# Patient Record
Sex: Male | Born: 1945 | Race: Black or African American | Hispanic: No | Smoking: Former smoker
Health system: Southern US, Community
[De-identification: ages and names within clinical notes are randomized; demographics above are authoritative.]

## PROBLEM LIST (undated history)

## (undated) DIAGNOSIS — J449 Chronic obstructive pulmonary disease, unspecified: Secondary | ICD-10-CM

## (undated) DIAGNOSIS — K219 Gastro-esophageal reflux disease without esophagitis: Secondary | ICD-10-CM

## (undated) DIAGNOSIS — G43909 Migraine, unspecified, not intractable, without status migrainosus: Secondary | ICD-10-CM

## (undated) DIAGNOSIS — K279 Peptic ulcer, site unspecified, unspecified as acute or chronic, without hemorrhage or perforation: Secondary | ICD-10-CM

## (undated) DIAGNOSIS — I639 Cerebral infarction, unspecified: Secondary | ICD-10-CM

## (undated) DIAGNOSIS — I10 Essential (primary) hypertension: Secondary | ICD-10-CM

## (undated) DIAGNOSIS — M1990 Unspecified osteoarthritis, unspecified site: Secondary | ICD-10-CM

## (undated) DIAGNOSIS — E7800 Pure hypercholesterolemia, unspecified: Secondary | ICD-10-CM

## (undated) DIAGNOSIS — J45909 Unspecified asthma, uncomplicated: Secondary | ICD-10-CM

## (undated) DIAGNOSIS — J189 Pneumonia, unspecified organism: Secondary | ICD-10-CM

## (undated) HISTORY — PX: TONSILLECTOMY: SUR1361

---

## 1997-09-10 ENCOUNTER — Emergency Department (HOSPITAL_COMMUNITY): Admission: EM | Admit: 1997-09-10 | Discharge: 1997-09-10 | Payer: Self-pay | Admitting: Emergency Medicine

## 2003-01-22 HISTORY — PX: MASS EXCISION: SHX2000

## 2003-02-07 ENCOUNTER — Ambulatory Visit (HOSPITAL_BASED_OUTPATIENT_CLINIC_OR_DEPARTMENT_OTHER): Admission: RE | Admit: 2003-02-07 | Discharge: 2003-02-07 | Payer: Self-pay | Admitting: General Surgery

## 2003-02-07 ENCOUNTER — Encounter (INDEPENDENT_AMBULATORY_CARE_PROVIDER_SITE_OTHER): Payer: Self-pay | Admitting: Specialist

## 2003-02-17 ENCOUNTER — Emergency Department (HOSPITAL_COMMUNITY): Admission: EM | Admit: 2003-02-17 | Discharge: 2003-02-17 | Payer: Self-pay | Admitting: Emergency Medicine

## 2003-03-27 ENCOUNTER — Emergency Department (HOSPITAL_COMMUNITY): Admission: EM | Admit: 2003-03-27 | Discharge: 2003-03-28 | Payer: Self-pay | Admitting: Emergency Medicine

## 2003-07-28 ENCOUNTER — Emergency Department (HOSPITAL_COMMUNITY): Admission: EM | Admit: 2003-07-28 | Discharge: 2003-07-28 | Payer: Self-pay | Admitting: Emergency Medicine

## 2003-08-06 ENCOUNTER — Emergency Department (HOSPITAL_COMMUNITY): Admission: EM | Admit: 2003-08-06 | Discharge: 2003-08-06 | Payer: Self-pay | Admitting: Emergency Medicine

## 2004-04-20 ENCOUNTER — Emergency Department (HOSPITAL_COMMUNITY): Admission: EM | Admit: 2004-04-20 | Discharge: 2004-04-20 | Payer: Self-pay

## 2004-11-21 ENCOUNTER — Emergency Department (HOSPITAL_COMMUNITY): Admission: EM | Admit: 2004-11-21 | Discharge: 2004-11-22 | Payer: Self-pay | Admitting: Emergency Medicine

## 2004-12-21 DIAGNOSIS — I639 Cerebral infarction, unspecified: Secondary | ICD-10-CM

## 2004-12-21 HISTORY — DX: Cerebral infarction, unspecified: I63.9

## 2004-12-28 ENCOUNTER — Inpatient Hospital Stay (HOSPITAL_COMMUNITY): Admission: EM | Admit: 2004-12-28 | Discharge: 2005-01-04 | Payer: Self-pay | Admitting: Emergency Medicine

## 2004-12-28 ENCOUNTER — Ambulatory Visit: Payer: Self-pay | Admitting: Family Medicine

## 2005-01-05 ENCOUNTER — Emergency Department (HOSPITAL_COMMUNITY): Admission: EM | Admit: 2005-01-05 | Discharge: 2005-01-05 | Payer: Self-pay | Admitting: Emergency Medicine

## 2005-01-12 ENCOUNTER — Ambulatory Visit: Payer: Self-pay | Admitting: Family Medicine

## 2006-10-16 ENCOUNTER — Emergency Department (HOSPITAL_COMMUNITY): Admission: EM | Admit: 2006-10-16 | Discharge: 2006-10-16 | Payer: Self-pay | Admitting: Emergency Medicine

## 2007-02-21 DIAGNOSIS — J189 Pneumonia, unspecified organism: Secondary | ICD-10-CM

## 2007-02-21 HISTORY — DX: Pneumonia, unspecified organism: J18.9

## 2007-02-21 HISTORY — PX: VIDEO ASSISTED THORACOSCOPY (VATS)/EMPYEMA: SHX6172

## 2007-03-01 ENCOUNTER — Emergency Department (HOSPITAL_COMMUNITY): Admission: EM | Admit: 2007-03-01 | Discharge: 2007-03-01 | Payer: Self-pay | Admitting: Emergency Medicine

## 2007-03-02 ENCOUNTER — Ambulatory Visit: Payer: Self-pay | Admitting: Thoracic Surgery

## 2007-03-02 ENCOUNTER — Inpatient Hospital Stay (HOSPITAL_COMMUNITY): Admission: EM | Admit: 2007-03-02 | Discharge: 2007-03-21 | Payer: Self-pay | Admitting: Emergency Medicine

## 2007-03-02 ENCOUNTER — Ambulatory Visit: Payer: Self-pay | Admitting: Internal Medicine

## 2007-03-10 ENCOUNTER — Encounter: Payer: Self-pay | Admitting: Thoracic Surgery

## 2007-03-10 ENCOUNTER — Encounter (INDEPENDENT_AMBULATORY_CARE_PROVIDER_SITE_OTHER): Payer: Self-pay | Admitting: Diagnostic Radiology

## 2007-03-15 DIAGNOSIS — J1520 Pneumonia due to staphylococcus, unspecified: Secondary | ICD-10-CM | POA: Insufficient documentation

## 2007-03-15 DIAGNOSIS — J869 Pyothorax without fistula: Secondary | ICD-10-CM | POA: Insufficient documentation

## 2007-03-19 DIAGNOSIS — T8189XA Other complications of procedures, not elsewhere classified, initial encounter: Secondary | ICD-10-CM | POA: Insufficient documentation

## 2007-03-29 ENCOUNTER — Encounter: Payer: Self-pay | Admitting: *Deleted

## 2007-04-05 ENCOUNTER — Encounter (INDEPENDENT_AMBULATORY_CARE_PROVIDER_SITE_OTHER): Payer: Self-pay | Admitting: Internal Medicine

## 2007-04-05 ENCOUNTER — Ambulatory Visit: Payer: Self-pay | Admitting: Family Medicine

## 2007-04-05 ENCOUNTER — Encounter: Admission: RE | Admit: 2007-04-05 | Discharge: 2007-04-05 | Payer: Self-pay | Admitting: Sports Medicine

## 2007-04-05 DIAGNOSIS — I10 Essential (primary) hypertension: Secondary | ICD-10-CM

## 2007-04-05 LAB — CONVERTED CEMR LAB
HCT: 40 % (ref 39.0–52.0)
Hemoglobin: 11.7 g/dL — ABNORMAL LOW (ref 13.0–17.0)
MCHC: 29.3 g/dL — ABNORMAL LOW (ref 30.0–36.0)
Platelets: 382 10*3/uL (ref 150–400)
RBC: 4.35 M/uL (ref 4.22–5.81)

## 2007-04-12 ENCOUNTER — Ambulatory Visit: Payer: Self-pay | Admitting: Family Medicine

## 2007-04-16 ENCOUNTER — Ambulatory Visit: Payer: Self-pay | Admitting: Family Medicine

## 2007-04-16 ENCOUNTER — Observation Stay (HOSPITAL_COMMUNITY): Admission: EM | Admit: 2007-04-16 | Discharge: 2007-04-18 | Payer: Self-pay | Admitting: Emergency Medicine

## 2007-05-23 ENCOUNTER — Ambulatory Visit: Payer: Self-pay | Admitting: Family Medicine

## 2007-05-23 ENCOUNTER — Encounter (INDEPENDENT_AMBULATORY_CARE_PROVIDER_SITE_OTHER): Payer: Self-pay | Admitting: Family Medicine

## 2007-05-23 DIAGNOSIS — R079 Chest pain, unspecified: Secondary | ICD-10-CM

## 2007-05-23 LAB — CONVERTED CEMR LAB
AST: 33 units/L (ref 0–37)
CO2: 25 meq/L (ref 19–32)
Calcium: 9.7 mg/dL (ref 8.4–10.5)
Creatinine, Ser: 0.8 mg/dL (ref 0.40–1.50)
LDL Cholesterol: 112 mg/dL — ABNORMAL HIGH (ref 0–99)
Potassium: 3.3 meq/L — ABNORMAL LOW (ref 3.5–5.3)
Sodium: 142 meq/L (ref 135–145)
Total CHOL/HDL Ratio: 5.1
Total Protein: 8.7 g/dL — ABNORMAL HIGH (ref 6.0–8.3)
Triglycerides: 143 mg/dL (ref <150)

## 2007-06-23 ENCOUNTER — Encounter (INDEPENDENT_AMBULATORY_CARE_PROVIDER_SITE_OTHER): Payer: Self-pay | Admitting: *Deleted

## 2007-07-13 ENCOUNTER — Ambulatory Visit: Payer: Self-pay | Admitting: Family Medicine

## 2007-07-13 ENCOUNTER — Encounter (INDEPENDENT_AMBULATORY_CARE_PROVIDER_SITE_OTHER): Payer: Self-pay | Admitting: Family Medicine

## 2007-07-13 LAB — CONVERTED CEMR LAB
Chloride: 107 meq/L (ref 96–112)
Potassium: 4.3 meq/L (ref 3.5–5.3)
Sodium: 141 meq/L (ref 135–145)

## 2007-07-15 ENCOUNTER — Encounter (INDEPENDENT_AMBULATORY_CARE_PROVIDER_SITE_OTHER): Payer: Self-pay | Admitting: *Deleted

## 2010-10-05 NOTE — Op Note (Signed)
NAME:  [name], [name] NO.:  [id number]   MEDICAL RECORD NO.:  [id number]          PATIENT TYPE:  INP   LOCATION:  3313                         FACILITY:  [facility]   PHYSICIAN:  Ines Bloomer, M.D. DATE OF BIRTH:  1946/04/25   DATE OF PROCEDURE:  03/10/2007  DATE OF DISCHARGE:                               OPERATIVE REPORT   PREOPERATIVE DIAGNOSIS:  Empyema right chest.   POSTOPERATIVE DIAGNOSIS:  Empyema right chest.   OPERATION PERFORMED:  Right video assisted thoracoscopic surgery,  drainage of empyema with decortication.   SURGEON:  Dr. Patricia Nettle. Burney   FIRST ASSISTANT:  Ms. Emerson Monte, RNFA .   ANESTHESIA:  General anesthesia.   After percutaneous insertion of all monitoring lines, the patient  underwent general anesthesia was prepped and draped in the usual sterile  manner.  Dual-lumen tube was inserted.  The patient was turned to right  lateral thoracotomy position.  Two trocar sites were made in the  anterior and posterior axillary line at the eighth intercostal space, at  the midaxillary line at the seventh intercostal space.  Three trocars  were inserted and the patient had a marked empyema of the lower lobe and  this required decortication.  We first drained the empyema and sent for  cultures and using Kaiser ring forceps and the 30 degree scope.  We  debrided the chest wall debriding superiorly, laterally and medially and  then freed the right lower lobe up off the diaphragm and right middle  lobe off the diaphragm and the middle mediastinum.  To do the  decortication we did a 5 cm incision in the sixth intercostal space and  partially divided latissimus with the serratus and then inserted a small  lamina spreader. With this we were able to remove the thickened exudate  by dissecting it off the right middle lobe, posterior segment of right  upper lobe and the right lower lobe.  I debrided the diaphragmatic  surface and the right lower lobe off  the pleura off the diaphragm and  cleared all exudate out of the costophrenic angle.  After this been  done, several small tears of the lung were repair of 3-0 Vicryl and the  chest tubes were placed through the trocar site, two straight and a  right-angle, the right-angle being the middle of the trocar sites.  The  chest was closed with two pericostal drilling through seventh rib and  going around the sixth rib, #1 Vicryl muscle layer, 2-0 Vicryl in  subcutaneous tissue and Dermabond for the skin.  A Marcaine block was  done in the usual fashion.  A single On-Q was inserted in the usual  fashion.  The patient was returned to recovery room in stable condition.      Ines Bloomer, M.D.  Electronically Signed     DPB/MEDQ  D:  03/10/2007  T:  03/12/2007  Job:  [id number]

## 2010-10-05 NOTE — Discharge Summary (Signed)
NAME:  [name], SOULES NO.:  [id number]   MEDICAL RECORD NO.:  [id number]          PATIENT TYPE:  INP   LOCATION:  3039                         FACILITY:  [facility]   PHYSICIAN:  Nestor Ramp, MD        DATE OF BIRTH:  Oct 10, 1945   DATE OF ADMISSION:  04/16/2007  DATE OF DISCHARGE:  04/18/2007                               DISCHARGE SUMMARY   PRIMARY CARE Florinda Taflinger:  Alanda Amass, M.D., of [facility].   REASON FOR ADMISSION:  Right-sided chest pain.   DISCHARGE DIAGNOSES:  1. Pleurisy secondary to video-assisted thoracic surgery procedure on      March 10, 2007.  2. Recent methicillin-resistant Staphylococcus aureus pneumonia.  3. 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  4. History of heroin addiction.  5. Hypertension.  6. Chronic anemia.  7. History of depression.   DISCHARGE MEDICATIONS:  1. Ultram 100 mg 1 tab p.o. q.6 h., #50, no refills.  2. Naproxen 500 mg 1 tab p.o. b.i.d., #25, no refills.  3. Hydrochlorothiazide 25 mg p.o. daily.  4. Metoprolol 50 mg p.o. daily.  5. Trazodone 100 mg p.o. nightly.  6. Lisinopril 20 mg p.o. daily.  7. Flexeril 10 mg p.o. b.i.d.  8. Norvasc 10 mg p.o. daily (patient was given a prescription for a 3-      month supply and encouraged to go to [facility] to get it filled as he      has had difficulty paying for medications in the past).   HOSPITAL COURSE:  In summary, patient is a 65 year old male who  presented to the hospital 6 weeks status post VATS procedure for empyema  for MRSA pneumonia.  His main complaint was continued pain in his right  lower chest.  1. Pleurisy:  Upon evaluation it was determined that patient's chest      pain was secondary to ongoing healing following his VATS procedure.      He had extensive decortication done secondary to his empyema in      October 2008.  Patient was counseled that he will continue to have      some  amount of chest pain for several weeks while his lungs heal.      While in the hospital, his pain medications were titrated until      patient received a moderate level of comfort and felt that he would      be able to function at home without difficulty.  He was ultimately      discharged on Ultram, Naprosyn, Flexeril.  2. Hypertension:  Throughout hospital stay, Mr. Torry blood      pressures proved difficult to control.  He reported that he took      all of his home medications as scheduled; however, his blood      pressures initially ranged from in the 170s to 220s systolic.  He      was continued on his home regimen of hydrochlorothiazide and      metoprolol and lisinopril and Norvasc were  added.  The patient's      blood pressure prior to discharge was 140/84.  He will need      continued titration and management of his hypertension as an      outpatient.   CONDITION AT THE TIME OF DISCHARGE:  Stable.   DISCHARGE LABS:  Sodium 136, potassium 3.1, BUN 13, creatinine 0.82,  white blood cell count 7.7, hemoglobin 12.1, platelets 349, positive  drug screen for opiates and amphetamines.   DISPOSITION:  Patient was discharged to home.   DISCHARGE FOLLOWUP:  The patient is to follow up at [facility] on April 26, 2007 at 9 a.m.   FOLLOWUP ISSUES:  1. Hypertension and titration of antihypertensive medications.  2. Hypokalemia.  Patient was given 40 mEq of potassium chloride x2      prior to discharge.  Should have repeat labs drawn as an      outpatient.  3. Continued management of patient's pain control medications.      Lauro Franklin, MD  Electronically Signed      Nestor Ramp, MD  Electronically Signed    TCB/MEDQ  D:  04/19/2007  T:  04/20/2007  Job:  [id number]

## 2010-10-05 NOTE — Discharge Summary (Signed)
NAME:  [name], SCHNITZER NO.:  [id number]   MEDICAL RECORD NO.:  [id number]          PATIENT TYPE:  INP   LOCATION:  2007                         FACILITY:  [facility]   PHYSICIAN:  Mariea Stable, MD   DATE OF BIRTH:  06-12-1945   DATE OF ADMISSION:  03/02/2007  DATE OF DISCHARGE:  03/21/2007                               DISCHARGE SUMMARY   ATTENDING PHYSICIAN:  Alvester Morin, M.D.   DIAGNOSES AT DISCHARGE:  1. Methicillin resistant Staphylococcus aureus (MRSA) pneumonia.  2. Status post video-assisted thoracoscopy.  3. Hypertension.  4. Reactive thrombocytosis.  5. Anemia of chronic disease.   PAST MEDICAL HISTORY:  1. Hypertension.  2. History of cerebrovascular accident (CVA), which was hemorrhagic.  3. History of substance abuse with opioid dependence.  4. Depression.   MEDICATIONS AT TIME OF DISCHARGE:  1. Norvasc 10 mg p.o. daily.  2. Augmentin 875 mg p.o. b.i.d.  3. Doxycycline 100 mg p.o. b.i.d.  4. Percocet 5/325 mg 1 to 2 tablets p.o. q.4h. p.r.n. pain.  5. Colace 100 mg p.o. b.i.d. p.r.n. constipation.   DISPOSITION AND FOLLOWUP:  Patient had an appointment scheduled at the  outpatient clinic at the [facility] on March 29, 2007, at 8:45 a.m.; the phone number is [phone].  He is also to  follow up with Dr. Edwyna Shell, whose office will contact the patient to  schedule an appointment, and the phone number is [phone].  During the  visit, a CBC should be checked to make sure that there is no elevated  white count.  Also, lung exam to determine good air movement in  bilateral lungs, especially in the right lower lobe where the majority  of the pneumonia and empyema were located.   PROCEDURES PERFORMED:  The patient has had multiple chest x-rays through  the hospital course.   The first chest x-ray was March 02, 2007, impression:  1. Increased air space opacity of the right lung space involving the      lower lobe and  middle lobes with small effusion compatible with      pneumonia.  2. Worsening atelectasis versus additional site of infection at the      left lung base.   CT scan of the chest with contrast March 09, 2007, impression:  1. Large loculated right pleural effusion with compression atelectasis      at the right lower lobe.  Focal small areas of consolidation in the      right middle lobe, one of which has a nodular type appearance.  I      recommend followup to exclude the possibility of a persistent      nodule, although I think it is most likely pneumonia.  2. Small focal area of pneumonia at the left lung base posteriorly.  3. No evidence of abscess.  The pleura does not enhance intensely      around the effusion and so this is not felt to represent an empyema      at this point.  As listed, the patient has had virtually daily x-  rays done at this point.   The last x-ray done on March 20, 2007, impression:  1. Improving aeration, right base.  The air space remains worrisome      for pneumonia.  2. Stable right pleural effusion.  3. Subsegmental atelectasis of left base.   Furthermore, with procedures, March 10, 2007, Dr. Jovita Gamma  performed a video-assisted thoracoscopic surgery for drainage of empyema  with decortication with placement of 3 chest tubes that were  discontinued on subsequent days without any evidence of pneumothorax  following removal of each of the 3 chest tubes.   CONSULTATIONS:  Again, Dr. Ines Bloomer, M.D. from [facility] was  consulted for the loculated effusion/empyema.   BRIEF HISTORY AND PHYSICAL:  [name] is a 65 year old African  American male with a history of hypertension, hemorrhagic CVA, history  of heroin abuse and depression that presents with a 5-day history of  pleuritic right-sided chest pain associated with cough, brown sputum  production.  Patient was released from prison last Thursday after a 4-  1/55-month stay.  He  endorses subjective fevers and chills x2 days, cough  with sputum production for about 5 days.  He reports one episode of  hemoptysis on Monday, which was described as a slight blood/red streaks  of sputum.  No substernal chest pain or radiation sustained to the neck,  jaw or arms.  Pain is worse with deep breaths, coughing and any  movement.  Nothing has improved the pain.  The patient denies any recent  travel or sick contacts.  He denies night sweats, diarrhea, nausea,  vomiting, constipation or any urinary symptoms.  Patient feels generally  weak, but no focal or neurological deficits, headache or loss of  consciousness.   PHYSICAL EXAMINATION:  VITALS:  Temperature 99.4, blood pressure 128/74,  pulse of 94, respiratory rate 14, oxygen saturation 95% on room air.  GENERAL:  The patient was awake, appears to be in pain with breathing.  Splints chest with coughing and appears in mild distress.  HEENT:  Eyes:  Pupils equally round and reactive to light and  accommodation.  Extraocular movements are intact.  Sclera was anicteric.  No conjunctival injection.  ENT:  Small sore at the right corner of the  mouth, bare dentition.  Oropharynx was clear with moist mucous  membranes.  NECK:  Supple, no lymphadenopathy.  Trachea was midline.  RESPIRATORY:  Poor inspirations with short gasps.  Faint bibasilar rales  with increased dullness to percussion in the right middle and right  lower lobe.  No wheezes or rhonchi.  CHEST:  Increased pain with palpation of right fourth and fifth ribs  below the nipple.  HEART:  Regular rate and rhythm, 2/6 systolic murmur at apex without  radiation to the axilla.  GI:  Abdomen is soft, nontender, nondistended, no masses palpated.  EXTREMITIES:  No cyanosis, clubbing or edema.  SKIN:  Without rashes.  LYMPHATICS:  No lymphadenopathy noted.  NEUROLOGICAL:  Patient is awake, alert and oriented, cranial nerves II-  XII are intact.  Strength intact.  Sensation  intact.  PSYCHIATRIC:  Somewhat reserved, but in pain.  Therefore, affect was  deemed appropriate.   LABS ON ADMISSION:  Sodium 142, potassium 3.4, chloride 107, bicarb 26,  BUN 15, creatinine 0.95, glucose 111, anion gap of 9, bilirubin 0.8, alk  phos 76, AST 26, ALT 28, protein 7.1, albumin 2.3, calcium 8.6, WBCs  18.7, ANC 15.5, hemoglobin 13, MCV 85, RDW 14.5, platelets 317,000.  UDS  was positive for opiates.  Chest x-ray as mentioned above in procedures.   HOSPITAL COURSE:  1. Bilateral pneumonia.  Patient was admitted for the pneumonia and      started on Rocephin and Zithromax originally for community acquired      pneumonia; had slight improvement, followed by increase in white      count and temperature spikes.  CT of the chest was done and showed      loculated effusions.  Dr. Edwyna Shell was consulted for drainage of      effusion.  VATS with decortication of empyema was performed on      March 10, 2007, of which vancomycin and Zosyn had already been      started by this time.  Patient had 3 chest tubes in place.      Patient's white count began to decrease, along with temperature.      Given respiratory culture only grew MRSA and the empyema culture      grew coag-negative staph, Zosyn was discontinued and vancomycin was      discontinued.  Patient had an insignificant increase in his white      count and temperatures the following day.  By the following day      patient spiked fever to greater than 100.5 and WBCs increased once      again.  The Zosyn, therefore, was restarted with subsequent      decrease in WBCs and temperature.  Once patient normalized, he was      switched to p.o. doxycycline and Augmentin for similar p.o.      coverage.  Patient was kept overnight and through the following day      without any increase in WBCs or temperatures and was discharged      thereafter.  2. Status post VATS.  Patient's pain was well controlled throughout.      Chest tube removal  with following chest x-rays were stable without      any pneumothoraces.  After the third chest tube removal, chest x-      rays again remained stable without pneumothoraces or increase in      the effusion.  3. Hypertension.  Patient was commenced, maintained on his home dose      of Norvasc through the hospitalization.  Blood pressure improved      once pain was better controlled.  4. Reactive thrombocytosis.  Patient came in with a normal platelet      count of about 270,000 or so.  Patient had a rapid increase in      platelets, peaking at approximately 1.1 million.  After vancomycin      was restarted the second time, patient's platelets began to      decrease subsequently.  5. Anemia of chronic disease, per anemia panel.  Hemoglobin remained      stable after surgery.   LABS ON THE DAY OF DISCHARGE:  WBCs 10.6, hemoglobin 9.9, platelets  816,000, sodium 136, potassium 3.7, chloride 108, bicarb 24, glucose 94,  BUN 7, creatinine 1, calcium 8.7.      Mariea Stable, MD  Electronically Signed     MA/MEDQ  D:  03/23/2007  T:  03/24/2007  Job:  [id number]   cc:   Maurice March, M.D.  Ines Bloomer, M.D.  [facility]

## 2010-10-05 NOTE — H&P (Signed)
NAMEMarland Kitchen  SHANKAR, SILBER NO.:  [id number]   MEDICAL RECORD NO.:  [id number]          PATIENT TYPE:  INP   LOCATION:  3022                         FACILITY:  [facility]   PHYSICIAN:  Altamese Cabal, M.D.  DATE OF BIRTH:  [birth year]   DATE OF ADMISSION:  04/16/2007  DATE OF DISCHARGE:                              HISTORY & PHYSICAL   REASON FOR ADMISSION:  Right-sided chest pain.   PRIMARY CARE PHYSICIAN:  [facility], Dr. Melynda Ripple.   HISTORY OF PRESENT ILLNESS:  This patient is a 65 year old male who is 6  weeks status post VATS for empyema from MRSA pneumonia.  The patient is  here today because he has had continued pain in his right lower chest  area since this procedure.  He also feels congested nasally and short of  breath at times.  He has been on ibuprofen for pain and states it is not  helping (of note, he has a history of opioid dependence).  The patient's  chest pain seems incisional and pleuritic and similar to the pain he had  2 weeks ago in clinic.  He has an occasional nonproductive cough.  No  fevers higher than 100.  The main reason he came to the ED today,  because he was tired of hurting.   PAST MEDICAL HISTORY:  1. Recent MRSA pneumonia.  2. A history of hemorrhagic CVA in 2006 in the setting of cocaine      toxicity.  3. A history of heroin addiction.  4. Hypertension.  5. Chronic anemia.  6. A history of depression.   PAST SURGICAL HISTORY:  1. A VATS in October 2008 for empyema secondary to MRSA pneumonia.  2. Chest tubes x3 in October 2008.   MEDICATIONS:  1. HCTZ 12.5 mg p.o. every day.  2. Ibuprofen 600 mg p.o. q.6h. p.r.n.  3. Metoprolol 50 mg p.o. b.i.d.  4. Trazodone 100 mg p.o. q.h.s.   ALLERGIES:  NO KNOWN DRUG ALLERGIES.   FAMILY HISTORY:  Patient's brother died of lung cancer.  Patient's  father died of unknown cause.  Patient's mother died of cirrhosis  secondary to alcohol use.   SOCIAL HISTORY:  He was recently  incarcerated for unknown reasons.  He  lives with a friend and daughter.  He is single.  He quit smoking 1 year  ago, rare alcohol, a history of heroin use, last use was 18 months ago.   PHYSICAL EXAM:  VITAL SIGNS:  Temperature 97.7, blood pressure 190/110,  pulse 85, respirations 16, O2 of 98% on room air.  GENERAL:  The patient is alert and oriented x3, no acute distress.  He  is speaking in complete sentences.  HEENT:  Head is normocephalic, atraumatic.  Pupils equal, round,  reactive to light and accommodation.  Extraocular movements intact.  Moist mucous membranes.  SKIN EXAM:  He has a puncture wound from the chest tube in his right  axillary line.  There is no exudate, erythema, or warmth.  He has 2  other scars in his right lower chest.  NECK:  Supple, no bruits, no JVD, slight cervical lymphadenopathy.  CARDIOVASCULAR:  He has a 1/6 systolic ejection murmur, otherwise  regular rate and rhythm.  LUNGS:  He has decreased breath sounds in his right base, otherwise  clear, normal work of breathing.  He is tender to palpation in his right  lower chest.  ABDOMEN:  Positive bowel sounds, soft, nontender, nondistended.  EXTREMITIES:  No clubbing, cyanosis, or edema.  NEURO EXAM:  Cranial nerves II through XII are grossly intact.  No focal  deficits.   LABS AND STUDIES:  CMP is pending.  White blood count 5.3, hemoglobin  12.0, hematocrit 36.9, platelets 293.  UA within normal limits.  Chest x-  ray shows stable blunting of the costophrenic angle consistent with  pleural thickening.  He also has a small stable pleural effusion and  changes consistent with COPD.  No pneumonia.   ASSESSMENT AND PLAN:  This is a 65 year old male with 6 weeks status  post video-assisted thoracoscopic surgery here for continued chest pain  and shortness of breath.  Problem:  1. Chest pain.  The pain seems incisional and pleuritic.  There is no      new changes on the chest x-ray.  He is afebrile with  normal white      count and normal saturations on room air.  He likely has pleural      irritation.  We will treat pain with Toradol.  We will attempt to      limit narcotics given history of heroin addiction.      Electrocardiogram is within normal limits.  We will also check      cardiac enzymes.  2. Shortness of breath, questionable splinting from pain.  No evidence      of pneumonia or pneumothorax on chest x-ray.  He may have      underlying chronic obstructive pulmonary disease.  We need      pulmonary function tests as an outpatient.  He also needs to follow      up with Dr. Edwyna Shell.  We will give albuterol and Atrovent in the      hospital and Flonase for nasal congestion.  3. Hypertension.  This is uncontrolled.  He states he has been      compliant, but he has had trouble affording medications, including      Norvasc.  We will continue metoprolol as prescribed in clinic by      Dr. Mayford Knife, check urine drug screen, and continue      hydrochlorothiazide at increased dose of 25 every day.  4. Chronic anemia.  His hemoglobin is low-normal, we will follow this.  5. A history of substance abuse.  The patient currently states he is      drug-free and attends Narcotics Anonymous meetings occasionally.      We will check a urine drug screen.  We will get a social work      consult for support if needed.  6. Fluids, electrolytes, nutrition, and gastrointestinal.  He will be      on a heart healthy diet, saline lock his IV.  7. Prophylaxis.  He will have sequential compression devices and      Protonix.      Altamese Cabal, M.D.  Electronically Signed     KS/MEDQ  D:  04/16/2007  T:  04/17/2007  Job:  [id number]

## 2010-10-08 NOTE — Discharge Summary (Signed)
NAMEMarland Kitchen  [name], [name] NO.:  [id number]   MEDICAL RECORD NO.:  [id number]          PATIENT TYPE:  INP   LOCATION:  3021                         FACILITY:  [facility]   PHYSICIAN:  Henri Medal, MDDATE OF BIRTH:  [birth year]   DATE OF ADMISSION:  12/28/2004  DATE OF DISCHARGE:  01/04/2005                                 DISCHARGE SUMMARY   DISCHARGE DIAGNOSES:  1.  Cerebrovascular accident, hemorrhagic.  2.  Hypertensive crisis.  3.  Substance abuse.   DISCHARGE MEDICATIONS:  1.  HCTZ 25 mg 1 tab p.o. daily.  2.  Lipitor 40 mg 1 tab p.o. daily.  3.  Tylenol 500 mg 1 tab p.o. q.4h. for headache.  4.  Methadone 55 mg 1 tab p.o. daily.   FOLLOW-UP APPOINTMENTS:  1.  Dr. Channing Mutters, neurosurgery.  Call for appointment in two weeks.  Phone [phone].  2.  Dr. Ludwig Clarks at family practice center, [facility].  Patient will call      for an appointment in 1-2 weeks.  Phone [phone].   PROCEDURE:  1.  Head CT, December 28, 2004:  Left caudate hemorrhage extending into the      lateral ventricle with a midline shift.  Indeterminate age, right      internal capsule thalamic lacunes.  2.  CT on December 29, 2004:  No acute changes.  No new abnormalities.  3.  Head CT on December 30, 2004:  Stable hemorrhage, left shift stable, 5 mm.      No hydrocephalus.  No new  findings.   CONSULTATIONS:  Dr. Channing Mutters, neurosurgery.   HOSPITAL COURSE:  A 65 year old African-Americanerican male with a history of  hypertension and cocaine abuse presented to the ED at [facility] with a  history of headache since 72 hours ago.  The patient described the headache  as severe, behind both eyes.  Burning and pressure in character.  No focal  neurologic symptoms that the patient recalls.  Headache is not improved with  Aleve for the past two days.  Decided to come to the ED.  The patient denied  fever, syncope, dizziness, visual changes, chills.  No cough or chest pain,  abdominal pain, no change in his  stools, no bloody stools.  Patient denies  nausea/vomiting.  Patient has a past medical history of hypertension for 3-4  years, heroin addiction, clean for eight months, followed by [facility].   PHYSICAL EXAMINATION:  VITAL SIGNS:  On physical examination, the patient  was afebrile.  Blood pressure 234/126, pulse 55, respirations 16-20, O2 sats  99% on room air.  GENERAL:  A thin male lying flat on the stretcher in mild distress.  Patient  was oriented x3 and alert.  Appropriate.  HEENT:  On fundal exam, no papilledema.  Eye exam was unremarkable.  NECK:  No JVD.  No bruits.  No thyromegaly.  CARDIOVASCULAR:  Unremarkable.  NEUROLOGIC:  Cranial nerves II-XII were intact.  No weakness.  No sensory  deficits.  Strength 5/5 x4.  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   LABORATORY DATA ON ADMISSION:  WBC 13.4, hemoglobin 15.4, hematocrit 49,  platelets 243.  Sodium 138, potassium 3.6, chloride 105, CO2 23, BUN 17,  creatinine 0.7, glucose 121.  AST 34, ALT 47, alkaline phosphatase 92, total  bilirubin 1.2.   CT of the head showed caudate hematoma with intraventricular extension.  A 5  mm shift, indeterminate age, lacunar right intracranial and thalamic  infarct.   EKG showed prolonged Q-T.  Question for ectopic atrial beats, rate 65.   PROBLEM LIST:  1.  CVA, hemorrhagic secondary to hypertensive emergency:  Dr. Channing Mutters,      neurosurgery, was consulted and evaluated the patient.  The patient's      neuro exam was unremarkable.  The patient was admitted to step-down bed      with frequent neuro checks.  The patient was placed on Dilaudid IV, PCA,      for headache.  Aggressive treatment for hypertension was initiated.  FLP      showed cholesterol of 174, triglycerides 95, HDL 44, LDL 111.  The      patient was placed on Lipitor 40 mg 1 tab p.o. daily for a goal of LDL      less than 100 (two-degree risk factors for CVA).  Throughout his       hospitalization, the patient's neuro exam was unremarkable.  CT scan      showed no signs of rebleeding/extension of intracranial hemorrhage.  Dr.      Channing Mutters, neurosurgeon, signed off from a neurological standpoint and will      follow the patient in two weeks after DC home (the patient will call for      an appointment).  This CVA hemorrhage was secondary to hypertensive      emergency, secondary to noncompliance with blood pressure medications      and cocaine abuse.  The patient's UDS was positive for cocaine.  The      patient is aware of the risks of uncontrolled hypertension and continued      abuse of cocaine.  2.  Hypertensive emergency:  Blood pressure on admission 234/126.  The      patient did present end-organ damage (CVA, hemorrhagic).  The patient      was initially placed on nitroprusside drip and labetalol IV p.r.n.      Later, IV medications were discontinued, and patient was placed on      labetalol, clonidine, lisinopril, and HCTZ.  After the first two days of      stable blood pressure, blood pressure decreased remarkably.  The patient      was left on HCTZ 25 mg p.o. daily with stable blood pressure.  By August      14, the blood pressure was 136/90.  The patient was DC'd home on HCTZ 25      mg 1 tab p.o. daily.  Certainly, for blood pressure control, the patient      needs to be compliant with medications and discontinue cocaine abuse.  3.  Status post substance abuse:  The patient has a history of heroin abuse.      He states that he has been free of heroin for eight month, and he is      currently on methadone 55 mg 1 tab p.o. daily per [facility].  Patient was      placed on this current regimen.  On admission, Mr. Coye was positive      for  cocaine on the UDS.  After discussing with the patient the risks of      continued abusing with this substance, such as death, recurrent CVA, the     patient stated that he will continue to receive [facility].  Patient was DC'd      home,  and the patient compromised to return to [facility] for methadone refills      for continued therapy/rehab.  4.  Social:  The patient has known medical issues.  The patient is unable to      afford medications.  Care management was involved for __________.  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  Patient has not renewed his      subscription to [employer].  The patient will follow up with the      family practice service for hypertension, hyperlipidemia.      Henri Medal, MD     FIM/MEDQ  D:  01/03/2005  T:  01/03/2005  Job:  [id number]

## 2010-10-08 NOTE — Discharge Summary (Signed)
NAMEMarland Kitchen  Landry, [name] NO.:  [id number]   MEDICAL RECORD NO.:  [id number]          PATIENT TYPE:  INP   LOCATION:  3021                         FACILITY:  [facility]   PHYSICIAN:  Alan Bumpers. Landry, M.D.DATE OF BIRTH:  [birth year]   DATE OF ADMISSION:  12/28/2004  DATE OF DISCHARGE:  01/04/2005                                 DISCHARGE SUMMARY   ADDENDUM:  Today, January 04, 2005, the patient is clinically stable, vital  signs reviewed, physical examination no changes from yesterday. (Neurologic  physical exam unremarkable).  The patient was discharged home after physical  therapy exercise.  The patient was discharged home on January 04, 2005  because this patient's brother is coming to stay with him, therefore, the  patient will have assistance if he needs it.  __________ care management to  arrange home PT x1.   DISCHARGE MEDICATIONS:  1.  Hydrochlorothiazide 25 mg one tablet p.o. daily.  2.  Lipitor 40 mg one tablet p.o. daily.  3.  Tylenol 500 mg one tablet p.o. q.4h. for headache, maximum dose per day      4 g.  4.  Methadone 55 mg daily.  __________  5.  Prozac 20 mg one tablet p.o. daily.   DISCHARGE DIAGNOSES:  1.  Cerebrovascular accident, hemorrhagic.  2.  Hypertensive crisis.  3.  Substance abuse.  4.  Depression.      Alan Medal, MD    ______________________________  [name], M.D.    [name]  D:  01/04/2005  T:  01/04/2005  Job:  [id number]

## 2010-10-08 NOTE — Op Note (Signed)
NAME:  [name], [name]                     ACCOUNT NO.:  [id number]   MEDICAL RECORD NO.:  [id number]                   PATIENT TYPE:  AMB   LOCATION:  DSC                                  FACILITY:  [facility]   PHYSICIAN:  Angelia Mould. Derrell Lolling, M.D.             DATE OF BIRTH:  1945/06/13   DATE OF PROCEDURE:  02/07/2003  DATE OF DISCHARGE:  02/07/2003                                 OPERATIVE REPORT   PREOPERATIVE DIAGNOSES:  1. A 3.5 soft tissue mass  of the occipital scalp.  2. A 2.5 cm soft tissue mass of the left face, preauricular area.   POSTOPERATIVE DIAGNOSES:  1. A 3.5 soft tissue mass  of the occipital scalp.  2. A 2.5 cm soft tissue mass of the left face, preauricular area, suspect     sebaceous cysts.   OPERATION:  Excision of soft tissue mass of the occipital scalp and soft  tissue mass of the left face.   INDICATIONS FOR PROCEDURE:  This is a 65 year old black man who was sent to  me through the courtesy of Dr. Yisroel Ramming at [facility]. The  patient states he had a lesion on the occipital scalp for 8 months which has  never been inflamed but is slowly enlarging. He also  states he has a soft  tissue mass in front of the left ear for about 6 months, also slowly  enlarging, but never inflamed or painful.   On examination  he has a 3.5-cm raised soft tissue mass of  the occipital  scalp which is slightly mobile. It does not appear fixed to the skull. The  skin is thin overlying the mass. In the left preauricular area there is a  1.5 x 2.5 cm soft tissue mass which is slightly mobile. It appears to be in  the superficial tissue  and does not appear to be fixed to the parotid  gland. He is brought to the operating room electively.   DESCRIPTION OF PROCEDURE:  The patient was brought to the operating room. He  was monitored and sedated by the anesthesia department. The occipital scalp  and the left face were prepped and draped in a sterile fashion. The  patient  was positioned appropriately.   A longitudinal elliptical incision was made over the occipital scalp mass  and what appeared  to be an epidermoid cyst was excised from the soft  tissue. Hemostasis was excellent and achieved with electrocautery. The wound  was irrigated and the skin was closed with skin staples.   Attention was directed to the left face, where a longitudinal incision was  made overlying the soft tissue mass. This was dissected away from the  subcutaneous tissue and also appeared  to be an epidermoid cyst. This was  completely excised as well. Hemostasis was excellent and achieved with  electrocautery. The wound was irrigated with saline. The skin was closed  with a running suture of  5-0 nylon. Clean bandages were placed.   The patient was taken to the recovery room in stable condition. Estimated  blood loss was 10 mL. There were no complications. Sponge and instrument  counts were correct.                                               Angelia Mould. Derrell Lolling, M.D.   HMI/MEDQ  D:  03/16/2003  T:  03/16/2003  Job:  [id number]

## 2010-10-08 NOTE — H&P (Signed)
NAME:  [name], [name] NO.:  [id number]   MEDICAL RECORD NO.:  [id number]          PATIENT TYPE:  INP   LOCATION:  1843                         FACILITY:  [facility]   PHYSICIAN:  Leighton Roach McDiarmid, M.D.DATE OF BIRTH:  02/18/1946   DATE OF ADMISSION:  12/28/2004  DATE OF DISCHARGE:                                HISTORY & PHYSICAL   CHIEF COMPLAINT:  Severe headache since Sunday night.   HISTORY OF PRESENT ILLNESS:  [name] is a 65 year old African-American  male with history of hypertension, who presents to the ED today with history  of a headache since Sunday afternoon, 72 hours ago.  Describes it as severe,  behind both eyes, feels burning and pressure.  No focal neurologic symptoms  that he recalls.  Has not improved with Aleve for the past two days and  decided to present to the ED because it is no better, but it is worse.  Denies any fever, syncope, dizziness. Vision changes, chills.  No cough or  chest pain, no abdominal pain, no change in stools, no bloody stool.  Denies  any nausea or vomiting.   PAST MEDICAL HISTORY:  1.  Hypertension x 3 to 4 years.  2.  Heroin addiction, clean for 8 months, followed at [facility].   ALLERGIES:  No known drug allergies.   MEDICATIONS:  1.  Norvasc 10 mg.  He is poorly compliant.  He has run out for the past 3      days and only takes it on a p.r.n. basis.  2.  Aspirin 325 mg daily.  3.  Methadone per [facility] 55 mg daily.   PAST SURGICAL HISTORY:  Left knee arthroscopy about 9 years ago.   SOCIAL HISTORY:  Lives with his brother in [place].  He is divorced, has  six children, all grown.  Part-time Production designer, theatre/television/film.  Denies health  insurance.  Pays for his medication via [facility] only.  Does admit to  tobacco, one pack per day x 15 years.  Alcohol at about six beers per week.  Drugs: History of heroin use x 5 years, quit and on methadone.  Remote  history of marijuana, denies any other drugs.   FAMILY HISTORY:   Mother passed away from cirrhosis.  She was an alcoholic.  Father has unknown history but deceased.  Six children, and two brothers are  all healthy.   OBJECTIVE:  VITAL SIGNS:  Temperature 98.7, blood pressure 234/126, range  186/224/106 to 133, pulse 55, respirations 16 to 20 at 99% on room air.  GENERAL:  This is a thin male lying flat on the stretcher in mild distress.  Alert and oriented x 3.  Appropriate.  Moves tentatively without difficulty.  HEENT:  PERRLA and midline pupils, EOMI.  Fundal exam without papilledema.  NECK:  Supple.  No JVD, no bruits, no thyromegaly, no neck stiffness.  CARDIOVASCULAR:  Regular rate and rhythm, no murmurs.  LUNGS:  Clear to auscultation and percussion.  Decreased breath sounds right  greater than left bases.  ABDOMEN:  Soft, nontender.  No masses, no hepatosplenomegaly but palpable  __________ pulse.  EXTREMITIES:  No edema, +2 pulses.  NEUROLOGIC:  Cranial nerves II-XII intact without deficit, 5/5 strength four  extremities, DTRs +1 bilateral upper extremities, +2 in lower extremities  with  Babinski's in both feet.   LABORATORY DATA:  WBC 13.4, hemoglobin 15.4, hematocrit 49, platelets 243.  Sodium 138, potassium 3.6, chloride 105, CO2 23, BUN 17, creatinine 0.7,  glucose 121.  AST 34, ALT 47, alkaline phosphatase 92, total bilirubin 1.2.   CT of head showed caudate hematoma with intervascular extension, 5 mm shift,  indeterminate age lacunar right intracranial and thalamic infarct.   EKG: Prolonged QT. Question ectopic atrial beats, rate 65.   ASSESSMENT AND PLAN:  [name] is a 65 year old African-American male who  presented to the emergency department with headache, now with hemorrhagic  cerebrovascular accident.   1.  Cerebrovascular accident and hemorrhagic likely secondary to very      uncontrolled hypertension. Dr. Channing Mutters, neurosurgery, ha already evaluated      the patient.  His assessment is to follow closely, aggressive blood       pressure treatment, and repeat CT in the morning.  We will observe.      Follow up neurological checks q.4h.  CT in the a.m.  Check other      cerebrovascular accident risk factors with lipid profile and coags.      Avoid aspirin and NSAIDs, and control pain with Dilaudid.  Especially      with history of methadone use, will likely need large amounts of pain      medicine.  2.  Hypertensive crisis.  Noncompliant on antihypertensive medications.      Question whether cost or feeling that it is necessary.  Will admit.      Goal blood pressure less than 130/80.  Will treat with labetalol 100      b.i.d., p.r.n. IV labetalol, HCTZ 25 mg daily, Lisinopril 10 mg daily.      Follow vital signs q.2h. overnight. Check a cardiac panel and another      EKG in the morning to follow cardiac status with his hypertension, also      chest x-ray for evaluation of cardiomegaly.  3.  History of heroin addiction on methadone treatment.  Will check urine      drug screen and call [facility] for his records.      Continue on home dose of methadone.  4.  Disposition.  Will admit, observe, and follow.  Dr. Channing Mutters will also      continue to follow.      [name]   [name]/MEDQ  D:  12/28/2004  T:  12/28/2004  Job:  [id number]   cc:   [facility]   Payton Doughty, M.D.  [address]  [place]  [place] [address]  Fax: [phone]

## 2011-03-01 LAB — DIFFERENTIAL
Basophils Relative: 1
Lymphocytes Relative: 29
Lymphs Abs: 1.6
Monocytes Absolute: 0.5
Monocytes Relative: 9
Neutro Abs: 3.2
Neutrophils Relative %: 60

## 2011-03-01 LAB — CBC
HCT: 34.9 — ABNORMAL LOW
HCT: 36.9 — ABNORMAL LOW
Hemoglobin: 12 — ABNORMAL LOW
MCHC: 32.6
MCHC: 32.6
MCHC: 33
MCV: 87
Platelets: 293
Platelets: 349
RBC: 4.01 — ABNORMAL LOW
RBC: 4.22
RDW: 18.7 — ABNORMAL HIGH
RDW: 19 — ABNORMAL HIGH

## 2011-03-01 LAB — COMPREHENSIVE METABOLIC PANEL
Albumin: 3.2 — ABNORMAL LOW
Alkaline Phosphatase: 72
BUN: 10
Calcium: 9
Glucose, Bld: 100 — ABNORMAL HIGH
Potassium: 3.3 — ABNORMAL LOW
Total Protein: 7.5

## 2011-03-01 LAB — BASIC METABOLIC PANEL
BUN: 13
CO2: 26
CO2: 28
Calcium: 9.1
Chloride: 103
Creatinine, Ser: 0.82
Creatinine, Ser: 0.93
GFR calc Af Amer: >60
GFR calc Af Amer: >60
Glucose, Bld: 91

## 2011-03-01 LAB — TROPONIN I: Troponin I: 0.02

## 2011-03-01 LAB — RAPID URINE DRUG SCREEN, HOSP PERFORMED
Cocaine: NOT DETECTED
Opiates: POSITIVE — AB

## 2011-03-01 LAB — URINALYSIS, ROUTINE W REFLEX MICROSCOPIC
Nitrite: NEGATIVE
Specific Gravity, Urine: 1.019
pH: 6

## 2011-03-01 LAB — POCT CARDIAC MARKERS
CKMB, poc: <1 — ABNORMAL LOW
Myoglobin, poc: 36.9
Operator id: 196461

## 2011-03-01 LAB — CK TOTAL AND CKMB (NOT AT ARMC)
CK, MB: 0.7
Total CK: 47

## 2011-03-02 LAB — BASIC METABOLIC PANEL
BUN: 6
BUN: 7
BUN: 9
CO2: 24
CO2: 24
CO2: 25
CO2: 26
CO2: 27
CO2: 27
Calcium: 7.5 — ABNORMAL LOW
Calcium: 8.1 — ABNORMAL LOW
Calcium: 8.2 — ABNORMAL LOW
Calcium: 8.5
Calcium: 8.5
Calcium: 8.6
Calcium: 8.7
Calcium: 8.7
Calcium: 8.7
Calcium: 8.9
Chloride: 105
Chloride: 109
Creatinine, Ser: 0.79
Creatinine, Ser: 0.81
Creatinine, Ser: 0.83
Creatinine, Ser: 0.88
Creatinine, Ser: 0.93
GFR calc Af Amer: >60
GFR calc Af Amer: >60
GFR calc Af Amer: >60
GFR calc Af Amer: >60
GFR calc Af Amer: >60
GFR calc Af Amer: >60
GFR calc Af Amer: >60
GFR calc Af Amer: >60
GFR calc non Af Amer: 59 — ABNORMAL LOW
GFR calc non Af Amer: >60
GFR calc non Af Amer: >60
GFR calc non Af Amer: >60
GFR calc non Af Amer: >60
GFR calc non Af Amer: >60
GFR calc non Af Amer: >60
GFR calc non Af Amer: >60
GFR calc non Af Amer: >60
Glucose, Bld: 102 — ABNORMAL HIGH
Glucose, Bld: 105 — ABNORMAL HIGH
Glucose, Bld: 105 — ABNORMAL HIGH
Glucose, Bld: 113 — ABNORMAL HIGH
Glucose, Bld: 84
Glucose, Bld: 89
Glucose, Bld: 94
Glucose, Bld: 99
Potassium: 3.6
Potassium: 3.7
Potassium: 3.7
Potassium: 3.8
Sodium: 125 — ABNORMAL LOW
Sodium: 134 — ABNORMAL LOW
Sodium: 136
Sodium: 137
Sodium: 137
Sodium: 138
Sodium: 138
Sodium: 139
Sodium: 139

## 2011-03-02 LAB — COMPREHENSIVE METABOLIC PANEL
ALT: 27
AST: 32
Albumin: 1.6 — ABNORMAL LOW
Alkaline Phosphatase: 39
Chloride: 103
GFR calc Af Amer: >60
Potassium: 4.4
Sodium: 135
Total Bilirubin: 0.6

## 2011-03-02 LAB — CBC
HCT: 25.6 — ABNORMAL LOW
HCT: 27.8 — ABNORMAL LOW
HCT: 28 — ABNORMAL LOW
HCT: 29.4 — ABNORMAL LOW
HCT: 30.3 — ABNORMAL LOW
HCT: 34.3 — ABNORMAL LOW
Hemoglobin: 11 — ABNORMAL LOW
Hemoglobin: 11.1 — ABNORMAL LOW
Hemoglobin: 11.6 — ABNORMAL LOW
Hemoglobin: 9.1 — ABNORMAL LOW
Hemoglobin: 9.2 — ABNORMAL LOW
Hemoglobin: 9.3 — ABNORMAL LOW
Hemoglobin: 9.3 — ABNORMAL LOW
Hemoglobin: 9.5 — ABNORMAL LOW
Hemoglobin: 9.6 — ABNORMAL LOW
Hemoglobin: 9.7 — ABNORMAL LOW
Hemoglobin: 9.9 — ABNORMAL LOW
Hemoglobin: 9.9 — ABNORMAL LOW
MCHC: 31.7
MCHC: 32.1
MCHC: 32.2
MCHC: 32.4
MCHC: 32.7
MCHC: 32.8
MCHC: 32.9
MCHC: 33
MCV: 83.2
Platelets: 1088
Platelets: 477 — ABNORMAL HIGH
Platelets: 749 — ABNORMAL HIGH
Platelets: 843 — ABNORMAL HIGH
Platelets: 938
RBC: 3.33 — ABNORMAL LOW
RBC: 3.34 — ABNORMAL LOW
RBC: 3.36 — ABNORMAL LOW
RBC: 3.59 — ABNORMAL LOW
RBC: 3.6 — ABNORMAL LOW
RBC: 4.03 — ABNORMAL LOW
RDW: 14.6 — ABNORMAL HIGH
RDW: 14.8 — ABNORMAL HIGH
RDW: 15.1 — ABNORMAL HIGH
RDW: 15.2 — ABNORMAL HIGH
RDW: 15.4 — ABNORMAL HIGH
RDW: 15.6 — ABNORMAL HIGH
RDW: 15.6 — ABNORMAL HIGH
RDW: 15.8 — ABNORMAL HIGH
RDW: 16 — ABNORMAL HIGH
WBC: 10.6 — ABNORMAL HIGH
WBC: 12.7 — ABNORMAL HIGH
WBC: 15.6 — ABNORMAL HIGH
WBC: 17.4 — ABNORMAL HIGH
WBC: 19.3 — ABNORMAL HIGH

## 2011-03-02 LAB — CULTURE, ROUTINE-ABSCESS

## 2011-03-02 LAB — CULTURE, BLOOD (ROUTINE X 2): Culture: NO GROWTH

## 2011-03-02 LAB — BLOOD GAS, ARTERIAL
Acid-base deficit: 1.6
pCO2 arterial: 41.3
pH, Arterial: 7.364

## 2011-03-02 LAB — FUNGUS CULTURE, BLOOD

## 2011-03-02 LAB — FUNGUS CULTURE W SMEAR: Fungal Smear: NONE SEEN

## 2011-03-02 LAB — CROSSMATCH
Antibody Screen: NEGATIVE
Antibody Screen: POSITIVE

## 2011-03-02 LAB — CULTURE, RESPIRATORY W GRAM STAIN: Culture: NORMAL

## 2011-03-02 LAB — NA AND K (SODIUM & POTASSIUM), RAND UR
Potassium Urine: 55
Sodium, Ur: <10

## 2011-03-02 LAB — URINALYSIS, ROUTINE W REFLEX MICROSCOPIC
Hgb urine dipstick: NEGATIVE
Protein, ur: NEGATIVE
Urobilinogen, UA: 1

## 2011-03-02 LAB — IRON AND TIBC: UIBC: 192

## 2011-03-02 LAB — HIV ANTIBODY (ROUTINE TESTING W REFLEX): HIV: NONREACTIVE

## 2011-03-02 LAB — AFB CULTURE WITH SMEAR (NOT AT ARMC)

## 2011-03-02 LAB — FOLATE: Folate: 14

## 2011-03-02 LAB — EXPECTORATED SPUTUM ASSESSMENT W GRAM STAIN, RFLX TO RESP C

## 2011-03-02 LAB — VITAMIN B12: Vitamin B-12: 782 (ref 211–911)

## 2011-03-02 LAB — RETICULOCYTES
RBC.: 3.76 — ABNORMAL LOW
Retic Ct Pct: 1.8

## 2011-03-02 LAB — VANCOMYCIN, TROUGH: Vancomycin Tr: 12.1

## 2011-03-03 LAB — COMPREHENSIVE METABOLIC PANEL
AST: 26
Albumin: 2.3 — ABNORMAL LOW
Alkaline Phosphatase: 66
BUN: 11
BUN: 15
Calcium: 7.9 — ABNORMAL LOW
Creatinine, Ser: 0.84
Creatinine, Ser: 0.95
GFR calc Af Amer: >60
Glucose, Bld: 105 — ABNORMAL HIGH
Potassium: 3.5
Total Protein: 5.9 — ABNORMAL LOW
Total Protein: 7.1

## 2011-03-03 LAB — I-STAT 8, (EC8 V) (CONVERTED LAB)
Acid-Base Excess: 2
BUN: 19
Chloride: 109
HCT: 43
Hemoglobin: 14.6
Operator id: 198171
Potassium: 3.4 — ABNORMAL LOW
Sodium: 143
pCO2, Ven: 38.5 — ABNORMAL LOW

## 2011-03-03 LAB — CBC
HCT: 34.1 — ABNORMAL LOW
HCT: 41.2
Hemoglobin: 10.9 — ABNORMAL LOW
Hemoglobin: 10.9 — ABNORMAL LOW
MCHC: 32.1
MCHC: 32.3
MCV: 83.6
MCV: 84.1
MCV: 85
Platelets: 268
Platelets: 286
Platelets: 317
RBC: 4.06 — ABNORMAL LOW
RBC: 4.11 — ABNORMAL LOW
RBC: 4.74
RDW: 14.5 — ABNORMAL HIGH
RDW: 14.6 — ABNORMAL HIGH
RDW: 14.7 — ABNORMAL HIGH
WBC: 19.2 — ABNORMAL HIGH

## 2011-03-03 LAB — CULTURE, BLOOD (ROUTINE X 2)
Culture: NO GROWTH
Culture: NO GROWTH

## 2011-03-03 LAB — RAPID URINE DRUG SCREEN, HOSP PERFORMED
Barbiturates: NOT DETECTED
Cocaine: NOT DETECTED
Opiates: POSITIVE — AB

## 2011-03-03 LAB — POCT CARDIAC MARKERS
Myoglobin, poc: 216
Operator id: 198171
Troponin i, poc: <0.05

## 2011-03-03 LAB — BASIC METABOLIC PANEL
CO2: 26
Calcium: 8.1 — ABNORMAL LOW
Calcium: 8.4
Creatinine, Ser: 0.86
GFR calc Af Amer: >60
GFR calc Af Amer: >60
GFR calc non Af Amer: >60
Glucose, Bld: 109 — ABNORMAL HIGH
Sodium: 140

## 2011-03-03 LAB — DIFFERENTIAL
Eosinophils Absolute: 0
Lymphocytes Relative: 4 — ABNORMAL LOW
Lymphs Abs: 0.7
Lymphs Abs: 0.8
Monocytes Absolute: 2.2 — ABNORMAL HIGH
Monocytes Relative: 12 — ABNORMAL HIGH
Monocytes Relative: 5
Neutro Abs: 15.5 — ABNORMAL HIGH
Neutro Abs: 17.5 — ABNORMAL HIGH
Neutrophils Relative %: 91 — ABNORMAL HIGH

## 2011-03-03 LAB — CULTURE, RESPIRATORY W GRAM STAIN

## 2011-03-03 LAB — LEGIONELLA ANTIGEN, URINE

## 2011-03-03 LAB — MAGNESIUM: Magnesium: 2.2

## 2011-03-03 LAB — EXPECTORATED SPUTUM ASSESSMENT W GRAM STAIN, RFLX TO RESP C

## 2011-03-03 LAB — CK TOTAL AND CKMB (NOT AT ARMC): CK, MB: 1.2

## 2011-03-03 LAB — POCT I-STAT CREATININE: Creatinine, Ser: 1

## 2012-09-17 ENCOUNTER — Other Ambulatory Visit: Payer: Self-pay | Admitting: Gastroenterology

## 2012-09-24 ENCOUNTER — Other Ambulatory Visit: Payer: Self-pay

## 2012-09-26 ENCOUNTER — Other Ambulatory Visit: Payer: Self-pay

## 2012-10-03 ENCOUNTER — Other Ambulatory Visit: Payer: Self-pay

## 2014-02-19 ENCOUNTER — Other Ambulatory Visit: Payer: Self-pay | Admitting: Urology

## 2014-03-06 ENCOUNTER — Encounter (HOSPITAL_COMMUNITY): Payer: Self-pay | Admitting: Pharmacy Technician

## 2014-03-07 NOTE — Patient Instructions (Addendum)
[name]  03/07/2014   Your procedure is scheduled on:  03/17/2014    Come thru the Emergency room and follow the signs to Short Stay at 0530am.    Call this number if you have problems the morning of surgery: [phone]   Remember:   Do not eat food or drink liquids after midnight.   Take these medicines the morning of surgery with A SIP OF WATER: Prilosec    Do not wear jewelry,   Do not wear lotions, powders, or perfumes.  deodorant.  . Men may shave face and neck.  Do not bring valuables to the hospital.  Contacts, dentures or bridgework may not be worn into surgery.  Leave suitcase in the car. After surgery it may be brought to your room.  For patients admitted to the hospital, checkout time is 11:00 AM the day of  discharge.       [place] - Preparing for Surgery Before surgery, you can play an important role.  Because skin is not sterile, your skin needs to be as free of germs as possible.  You can reduce the number of germs on your skin by washing with CHG (chlorahexidine gluconate) soap before surgery.  CHG is an antiseptic cleaner which kills germs and bonds with the skin to continue killing germs even after washing. Please DO NOT use if you have an allergy to CHG or antibacterial soaps.  If your skin becomes reddened/irritated stop using the CHG and inform your nurse when you arrive at Short Stay. Do not shave (including legs and underarms) for at least 48 hours prior to the first CHG shower.  You may shave your face/neck. Please follow these instructions carefully:  1.  Shower with CHG Soap the night before surgery and the  morning of Surgery.  2.  If you choose to wash your hair, wash your hair first as usual with your  normal  shampoo.  3.  After you shampoo, rinse your hair and body thoroughly to remove the  shampoo.                           4.  Use CHG as you would any other liquid soap.  You can apply chg directly  to the skin and wash   Gently with a scrungie or clean washcloth.  5.  Apply the CHG Soap to your body ONLY FROM THE NECK DOWN.   Do not use on face/ open                           Wound or open sores. Avoid contact with eyes, ears mouth and genitals (private parts).                       Wash face,  Genitals (private parts) with your normal soap.             6.  Wash thoroughly, paying special attention to the area where your surgery  will be performed.  7.  Thoroughly rinse your body with warm water from the neck down.  8.  DO NOT shower/wash with your normal soap after using and rinsing off  the CHG Soap.                9.  Pat yourself dry with a clean towel.            10.  Wear clean pajamas.            11.  Place clean sheets on your bed the night of your first shower and do not  sleep with pets. Day of Surgery : Do not apply any lotions/deodorants the morning of surgery.  Please wear clean clothes to the hospital/surgery center.  FAILURE TO FOLLOW THESE INSTRUCTIONS MAY RESULT IN THE CANCELLATION OF YOUR SURGERY PATIENT SIGNATURE_________________________________  NURSE SIGNATURE__________________________________  ________________________________________________________________________    Please read over the following fact sheets that you were given: MRSA Information, coughing and deep breathing exercises, leg exercises

## 2014-03-10 ENCOUNTER — Encounter (HOSPITAL_COMMUNITY)
Admission: RE | Admit: 2014-03-10 | Discharge: 2014-03-10 | Disposition: A | Discharge status: Home / Self Care | Payer: Medicare Other | Source: Ambulatory Visit | Attending: Urology | Admitting: Urology

## 2014-03-10 ENCOUNTER — Encounter (HOSPITAL_COMMUNITY): Payer: Self-pay

## 2014-03-10 ENCOUNTER — Ambulatory Visit (HOSPITAL_COMMUNITY)
Admission: RE | Admit: 2014-03-10 | Discharge: 2014-03-10 | Disposition: A | Discharge status: Home / Self Care | Payer: Medicare Other | Source: Ambulatory Visit | Attending: Urology | Admitting: Urology

## 2014-03-10 DIAGNOSIS — I10 Essential (primary) hypertension: Secondary | ICD-10-CM | POA: Diagnosis not present

## 2014-03-10 DIAGNOSIS — N401 Enlarged prostate with lower urinary tract symptoms: Secondary | ICD-10-CM | POA: Insufficient documentation

## 2014-03-10 DIAGNOSIS — Z01818 Encounter for other preprocedural examination: Secondary | ICD-10-CM | POA: Insufficient documentation

## 2014-03-10 HISTORY — DX: Unspecified osteoarthritis, unspecified site: M19.90

## 2014-03-10 HISTORY — DX: Essential (primary) hypertension: I10

## 2014-03-10 HISTORY — DX: Gastro-esophageal reflux disease without esophagitis: K21.9

## 2014-03-10 LAB — COMPREHENSIVE METABOLIC PANEL
ALT: 19 U/L (ref 0–53)
AST: 26 U/L (ref 0–37)
Albumin: 3.5 g/dL (ref 3.5–5.2)
Alkaline Phosphatase: 89 U/L (ref 39–117)
Anion gap: 11 (ref 5–15)
BUN: 9 mg/dL (ref 6–23)
CO2: 27 mEq/L (ref 19–32)
Calcium: 9.9 mg/dL (ref 8.4–10.5)
Chloride: 100 mEq/L (ref 96–112)
Creatinine, Ser: 0.95 mg/dL (ref 0.50–1.35)
GFR calc Af Amer: >90 mL/min (ref >90)
GFR calc non Af Amer: 85 mL/min — ABNORMAL LOW (ref >90)
Glucose, Bld: 118 mg/dL — ABNORMAL HIGH (ref 70–99)
Potassium: 5.3 mEq/L (ref 3.7–5.3)
Sodium: 138 mEq/L (ref 137–147)
Total Bilirubin: 0.3 mg/dL (ref 0.3–1.2)
Total Protein: 8.2 g/dL (ref 6.0–8.3)

## 2014-03-10 LAB — CBC
HCT: 42.6 % (ref 39.0–52.0)
Hemoglobin: 13.6 g/dL (ref 13.0–17.0)
MCH: 28.9 pg (ref 26.0–34.0)
MCHC: 31.9 g/dL (ref 30.0–36.0)
MCV: 90.6 fL (ref 78.0–100.0)
Platelets: 394 10*3/uL (ref 150–400)
RBC: 4.7 MIL/uL (ref 4.22–5.81)
RDW: 12.5 % (ref 11.5–15.5)
WBC: 7.5 10*3/uL (ref 4.0–10.5)

## 2014-03-10 LAB — RAPID URINE DRUG SCREEN, HOSP PERFORMED
Amphetamines: NOT DETECTED
Barbiturates: NOT DETECTED
Benzodiazepines: NOT DETECTED
Cocaine: POSITIVE — AB
Opiates: POSITIVE — AB
Tetrahydrocannabinol: NOT DETECTED

## 2014-03-10 LAB — SURGICAL PCR SCREEN
MRSA, PCR: INVALID — AB
Staphylococcus aureus: INVALID — AB

## 2014-03-10 NOTE — Progress Notes (Signed)
EKG and CXR done 03/10/14 in [facility].

## 2014-03-10 NOTE — Progress Notes (Signed)
At time of preop appointment patient denied any illicit drug use.  Patient states the last time he used any illicit durgs was 2 years ago.  Just an FYI.

## 2014-03-10 NOTE — Progress Notes (Signed)
CXR and Urine drug screen results faxed via [facility] to Dr Vernie Ammonsttelin.

## 2014-03-12 LAB — MRSA CULTURE

## 2014-03-16 NOTE — Anesthesia Preprocedure Evaluation (Addendum)
Anesthesia Evaluation  Patient identified by MRN, date of birth, ID band Patient awake    Reviewed: Allergy & Precautions, H&P , NPO status , Patient's Chart, lab work & pertinent test results  Airway Mallampati: II TM Distance: >3 FB Neck ROM: Full    Dental no notable dental hx.    Pulmonary pneumonia -, resolved, Current Smoker,  breath sounds clear to auscultation  Pulmonary exam normal       Cardiovascular hypertension, Pt. on medications Rhythm:Regular Rate:Normal     Neuro/Psych negative neurological ROS  negative psych ROS   GI/Hepatic GERD-  ,(+)     substance abuse  cocaine use,   Endo/Other  negative endocrine ROS  Renal/GU negative Renal ROS     Musculoskeletal  (+) Arthritis -,   Abdominal   Peds  Hematology negative hematology ROS (+)   Anesthesia Other Findings   Reproductive/Obstetrics                          Anesthesia Physical Anesthesia Plan  ASA: II  Anesthesia Plan: General   Post-op Pain Management:    Induction: Intravenous  Airway Management Planned: LMA  Additional Equipment:   Intra-op Plan:   Post-operative Plan: Extubation in OR  Informed Consent: I have reviewed the patients History and Physical, chart, labs and discussed the procedure including the risks, benefits and alternatives for the proposed anesthesia with the patient or authorized representative who has indicated his/her understanding and acceptance.   Dental advisory given  Plan Discussed with: CRNA  Anesthesia Plan Comments: (Pt denies cocaine use since ~1 wk ago. Dr. Vernie Ammonsttelin aware.)       Anesthesia Quick Evaluation

## 2014-03-17 ENCOUNTER — Encounter (HOSPITAL_COMMUNITY): Payer: Medicare Other | Admitting: Anesthesiology

## 2014-03-17 ENCOUNTER — Observation Stay (HOSPITAL_COMMUNITY): Payer: Medicare Other

## 2014-03-17 ENCOUNTER — Ambulatory Visit (HOSPITAL_COMMUNITY): Payer: Medicare Other | Admitting: Anesthesiology

## 2014-03-17 ENCOUNTER — Encounter (HOSPITAL_COMMUNITY)
Admission: RE | Disposition: A | Discharge status: Home / Self Care | Payer: Self-pay | Source: Ambulatory Visit | Attending: Urology

## 2014-03-17 ENCOUNTER — Ambulatory Visit (HOSPITAL_COMMUNITY)
Admission: RE | Admit: 2014-03-17 | Discharge: 2014-03-18 | Disposition: A | Discharge status: Home / Self Care | Payer: Medicare Other | Source: Ambulatory Visit | Attending: Urology | Admitting: Urology

## 2014-03-17 ENCOUNTER — Encounter (HOSPITAL_COMMUNITY): Payer: Self-pay | Admitting: *Deleted

## 2014-03-17 ENCOUNTER — Ambulatory Visit (HOSPITAL_COMMUNITY): Payer: Medicare Other

## 2014-03-17 DIAGNOSIS — R918 Other nonspecific abnormal finding of lung field: Secondary | ICD-10-CM | POA: Diagnosis not present

## 2014-03-17 DIAGNOSIS — R350 Frequency of micturition: Secondary | ICD-10-CM | POA: Diagnosis not present

## 2014-03-17 DIAGNOSIS — E785 Hyperlipidemia, unspecified: Secondary | ICD-10-CM | POA: Diagnosis not present

## 2014-03-17 DIAGNOSIS — E780 Pure hypercholesterolemia: Secondary | ICD-10-CM | POA: Diagnosis not present

## 2014-03-17 DIAGNOSIS — K219 Gastro-esophageal reflux disease without esophagitis: Secondary | ICD-10-CM | POA: Insufficient documentation

## 2014-03-17 DIAGNOSIS — I119 Hypertensive heart disease without heart failure: Secondary | ICD-10-CM | POA: Diagnosis not present

## 2014-03-17 DIAGNOSIS — R3912 Poor urinary stream: Secondary | ICD-10-CM | POA: Insufficient documentation

## 2014-03-17 DIAGNOSIS — Z79899 Other long term (current) drug therapy: Secondary | ICD-10-CM | POA: Diagnosis not present

## 2014-03-17 DIAGNOSIS — R3915 Urgency of urination: Secondary | ICD-10-CM | POA: Diagnosis not present

## 2014-03-17 DIAGNOSIS — N433 Hydrocele, unspecified: Secondary | ICD-10-CM | POA: Diagnosis present

## 2014-03-17 DIAGNOSIS — N401 Enlarged prostate with lower urinary tract symptoms: Secondary | ICD-10-CM | POA: Diagnosis present

## 2014-03-17 DIAGNOSIS — R911 Solitary pulmonary nodule: Secondary | ICD-10-CM

## 2014-03-17 DIAGNOSIS — R3914 Feeling of incomplete bladder emptying: Secondary | ICD-10-CM

## 2014-03-17 DIAGNOSIS — G894 Chronic pain syndrome: Secondary | ICD-10-CM | POA: Insufficient documentation

## 2014-03-17 DIAGNOSIS — G479 Sleep disorder, unspecified: Secondary | ICD-10-CM | POA: Insufficient documentation

## 2014-03-17 DIAGNOSIS — B1920 Unspecified viral hepatitis C without hepatic coma: Secondary | ICD-10-CM | POA: Insufficient documentation

## 2014-03-17 DIAGNOSIS — B3749 Other urogenital candidiasis: Secondary | ICD-10-CM | POA: Insufficient documentation

## 2014-03-17 DIAGNOSIS — F17210 Nicotine dependence, cigarettes, uncomplicated: Secondary | ICD-10-CM | POA: Insufficient documentation

## 2014-03-17 HISTORY — PX: TRANSURETHRAL RESECTION OF PROSTATE: SHX73

## 2014-03-17 LAB — RAPID URINE DRUG SCREEN, HOSP PERFORMED
Amphetamines: NOT DETECTED
Barbiturates: NOT DETECTED
Benzodiazepines: NOT DETECTED
Cocaine: POSITIVE — AB
Opiates: POSITIVE — AB
Tetrahydrocannabinol: NOT DETECTED

## 2014-03-17 SURGERY — TURP (TRANSURETHRAL RESECTION OF PROSTATE)
Anesthesia: General

## 2014-03-17 MED ORDER — HYDROCODONE-ACETAMINOPHEN 5-325 MG PO TABS
1.0000 | ORAL_TABLET | ORAL | Status: DC | PRN
  Administered 2014-03-17 – 2014-03-18 (×4): 2 via ORAL
  Filled 2014-03-17 (×5): qty 2

## 2014-03-17 MED ORDER — ACETAMINOPHEN 325 MG PO TABS
650.0000 mg | ORAL_TABLET | ORAL | Status: DC | PRN
Start: 2014-03-17 — End: 2014-03-18

## 2014-03-17 MED ORDER — IOHEXOL 300 MG/ML  SOLN
80.0000 mL | Freq: Once | INTRAMUSCULAR | Status: AC | PRN
  Administered 2014-03-17: 80 mL via INTRAVENOUS

## 2014-03-17 MED ORDER — HYDROMORPHONE HCL 1 MG/ML IJ SOLN
INTRAMUSCULAR | Status: AC
  Filled 2014-03-17: qty 1

## 2014-03-17 MED ORDER — LACTATED RINGERS IV SOLN: INTRAVENOUS | Status: DC

## 2014-03-17 MED ORDER — TRAZODONE HCL 50 MG PO TABS
50.0000 mg | ORAL_TABLET | Freq: Every evening | ORAL | Status: DC | PRN
  Administered 2014-03-17: 50 mg via ORAL
  Filled 2014-03-17: qty 1

## 2014-03-17 MED ORDER — BELLADONNA ALKALOIDS-OPIUM 16.2-60 MG RE SUPP
1.0000 | Freq: Four times a day (QID) | RECTAL | Status: DC | PRN
  Administered 2014-03-18: 1 via RECTAL
  Filled 2014-03-17: qty 1

## 2014-03-17 MED ORDER — FENTANYL CITRATE 0.05 MG/ML IJ SOLN
INTRAMUSCULAR | Status: AC
  Filled 2014-03-17: qty 2

## 2014-03-17 MED ORDER — MIDAZOLAM HCL 5 MG/5ML IJ SOLN
INTRAMUSCULAR | Status: DC | PRN
  Administered 2014-03-17: 2 mg via INTRAVENOUS

## 2014-03-17 MED ORDER — PROPOFOL 10 MG/ML IV BOLUS
INTRAVENOUS | Status: DC | PRN
  Administered 2014-03-17: 150 mg via INTRAVENOUS

## 2014-03-17 MED ORDER — HYDRALAZINE HCL 20 MG/ML IJ SOLN
INTRAMUSCULAR | Status: AC
  Filled 2014-03-17: qty 1

## 2014-03-17 MED ORDER — MIDAZOLAM HCL 2 MG/2ML IJ SOLN
INTRAMUSCULAR | Status: AC
  Filled 2014-03-17: qty 2

## 2014-03-17 MED ORDER — MEPERIDINE HCL 50 MG/ML IJ SOLN: 6.2500 mg | INTRAMUSCULAR | Status: DC | PRN

## 2014-03-17 MED ORDER — LACTATED RINGERS IV SOLN
INTRAVENOUS | Status: DC | PRN
  Administered 2014-03-17: 07:00:00 via INTRAVENOUS
  Administered 2014-03-17: 1000 mL

## 2014-03-17 MED ORDER — MUPIROCIN 2 % EX OINT
TOPICAL_OINTMENT | Freq: Once | CUTANEOUS | Status: AC
  Administered 2014-03-17: 1 via NASAL
  Filled 2014-03-17 (×2): qty 22

## 2014-03-17 MED ORDER — ONDANSETRON HCL 4 MG/2ML IJ SOLN
4.0000 mg | INTRAMUSCULAR | Status: DC | PRN
  Administered 2014-03-18: 4 mg via INTRAVENOUS
  Filled 2014-03-17: qty 2

## 2014-03-17 MED ORDER — OXYCODONE HCL 5 MG PO TABS: 5.0000 mg | ORAL_TABLET | Freq: Once | ORAL | Status: DC | PRN

## 2014-03-17 MED ORDER — CIPROFLOXACIN IN D5W 400 MG/200ML IV SOLN
400.0000 mg | Freq: Two times a day (BID) | INTRAVENOUS | Status: AC
  Administered 2014-03-17: 400 mg via INTRAVENOUS
  Filled 2014-03-17 (×2): qty 200

## 2014-03-17 MED ORDER — OXYCODONE HCL 5 MG/5ML PO SOLN
5.0000 mg | Freq: Once | ORAL | Status: DC | PRN
  Filled 2014-03-17: qty 5

## 2014-03-17 MED ORDER — BACITRACIN-NEOMYCIN-POLYMYXIN 400-5-5000 EX OINT: 1.0000 "application " | TOPICAL_OINTMENT | Freq: Three times a day (TID) | CUTANEOUS | Status: DC | PRN

## 2014-03-17 MED ORDER — PROPOFOL 10 MG/ML IV BOLUS
INTRAVENOUS | Status: AC
  Filled 2014-03-17: qty 20

## 2014-03-17 MED ORDER — CIPROFLOXACIN IN D5W 400 MG/200ML IV SOLN
INTRAVENOUS | Status: AC
  Filled 2014-03-17: qty 200

## 2014-03-17 MED ORDER — SODIUM CHLORIDE 0.9 % IV SOLN
INTRAVENOUS | Status: DC
  Administered 2014-03-17 (×2): via INTRAVENOUS

## 2014-03-17 MED ORDER — SODIUM CHLORIDE 0.9 % IR SOLN
Status: DC | PRN
  Administered 2014-03-17: 15000 mL

## 2014-03-17 MED ORDER — CIPROFLOXACIN IN D5W 400 MG/200ML IV SOLN
400.0000 mg | Freq: Once | INTRAVENOUS | Status: AC
  Administered 2014-03-17: 400 mg via INTRAVENOUS

## 2014-03-17 MED ORDER — HYDROMORPHONE HCL 1 MG/ML IJ SOLN
0.2500 mg | INTRAMUSCULAR | Status: DC | PRN
  Administered 2014-03-17 (×2): 0.5 mg via INTRAVENOUS

## 2014-03-17 MED ORDER — SODIUM CHLORIDE 0.9 % IR SOLN
3000.0000 mL | Status: DC
  Administered 2014-03-17 (×2): 3000 mL

## 2014-03-17 MED ORDER — PROMETHAZINE HCL 25 MG/ML IJ SOLN: 6.2500 mg | INTRAMUSCULAR | Status: DC | PRN

## 2014-03-17 MED ORDER — HYDRALAZINE HCL 20 MG/ML IJ SOLN
5.0000 mg | INTRAMUSCULAR | Status: DC | PRN
  Administered 2014-03-17 (×2): 5 mg via INTRAVENOUS

## 2014-03-17 MED ORDER — CETYLPYRIDINIUM CHLORIDE 0.05 % MT LIQD: 7.0000 mL | Freq: Two times a day (BID) | OROMUCOSAL | Status: DC

## 2014-03-17 MED ORDER — AMLODIPINE BESYLATE 10 MG PO TABS
10.0000 mg | ORAL_TABLET | Freq: Once | ORAL | Status: AC
  Administered 2014-03-17: 10 mg via ORAL
  Filled 2014-03-17: qty 1

## 2014-03-17 MED ORDER — FENTANYL CITRATE 0.05 MG/ML IJ SOLN
INTRAMUSCULAR | Status: DC | PRN
  Administered 2014-03-17 (×3): 50 ug via INTRAVENOUS

## 2014-03-17 SURGICAL SUPPLY — 23 items
BAG URINE DRAINAGE (UROLOGICAL SUPPLIES) ×1 IMPLANT
BAG URO CATCHER STRL LF (DRAPE) ×2 IMPLANT
BLADE SURG 15 STRL LF DISP TIS (BLADE) IMPLANT
BLADE SURG 15 STRL SS (BLADE)
CATH FOLEY 3WAY 30CC 24FR (CATHETERS) ×2
CATH URTH STD 24FR FL 3W 2 (CATHETERS) ×1 IMPLANT
DRAPE CAMERA CLOSED 9X96 (DRAPES) ×2 IMPLANT
ELECT LOOP MED HF 24F 12D CBL (CLIP) ×1 IMPLANT
ELECT REM PT RETURN 9FT ADLT (ELECTROSURGICAL)
ELECTRODE REM PT RTRN 9FT ADLT (ELECTROSURGICAL) ×1 IMPLANT
EVACUATOR MICROVAS BLADDER (UROLOGICAL SUPPLIES) ×2 IMPLANT
GLOVE BIOGEL M 8.0 STRL (GLOVE) ×6 IMPLANT
GOWN STRL REUS W/ TWL XL LVL3 (GOWN DISPOSABLE) ×1 IMPLANT
GOWN STRL REUS W/TWL XL LVL3 (GOWN DISPOSABLE) ×6 IMPLANT
KIT ASPIRATION TUBING (SET/KITS/TRAYS/PACK) ×2 IMPLANT
LOOPS RESECTOSCOPE DISP (ELECTROSURGICAL) ×1 IMPLANT
MANIFOLD NEPTUNE II (INSTRUMENTS) ×2 IMPLANT
NS IRRIG 1000ML POUR BTL (IV SOLUTION) ×2 IMPLANT
PACK CYSTO (CUSTOM PROCEDURE TRAY) ×2 IMPLANT
SUT ETHILON 3 0 PS 1 (SUTURE) IMPLANT
SYR 30ML LL (SYRINGE) ×1 IMPLANT
TUBING CONNECTING 10 (TUBING) ×2 IMPLANT
WIRE COONS/BENSON .038X145CM (WIRE) IMPLANT

## 2014-03-17 NOTE — H&P (Signed)
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    BPH with LUTS: He was noted to have marked prostatic enlargement to examination. He did have some voiding symptoms consisting of frequency, urgency, weak stream and intermittency.  Current therapy: Tamsulosin    Right hydrocele: He was noted to have a right hydrocele. This is asymptomatic.    Incomplete bladder emptying: In 8/15 he was found to have a PVR of 504 cc. His creatinine however was normal as was his PSA. A trial of tamsulosin did not result in improvement in his urinary symptoms.    Interval history: No new voiding complaints are noted today.    Past Medical History Problems  1. 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 History of Chronic Pain Syndrome (338.4) 3. History of Hepatitis C virus (070.70) 4. History of arthritis (V13.4) 5. History of esophageal reflux (V12.79) 6. History of glaucoma (V12.49) 7. History of hypercholesterolemia (V12.29) 8. History of hyperlipidemia (V12.29) 9. History of hypertension (V12.59) 10. History of Hypertensive Heart Disease (402.90) 11. History of Nicotine Dependence 12. History of Sleep Disturbances (780.50) 13. History of Thyroid Function Tests Nonspecific Abnormal Findings (794.5)    Surgical History Problems  1. History of Lung Surgery  Current Meds 1. Amlodipine Besy-Benazepril HCl - 10-40 MG Oral Capsule;  Therapy: (Recorded:01Oct2014) to Recorded 2. Hydrochlorothiazide 25 MG Oral Tablet;  Therapy: (Recorded:01Oct2014) to Recorded 3. Omeprazole 40 MG Oral Capsule Delayed Release;  Therapy: (Recorded:01Oct2014) to Recorded 4. Tamsulosin HCl - 0.4 MG Oral Capsule; TAKE 1 CAPSULE AT BEDTIME;  Therapy: 12Aug2015 to (Last Rx:12Aug2015)  Requested for: 12Aug2015 Ordered 5. TraZODone HCl - 150 MG Oral Tablet;  Therapy: (Recorded:01Oct2014) to Recorded  Allergies Medication  1. No Known Drug Allergies  Family History Problems  1. No  pertinent family history : Mother  Social History Problems  1. Denied: History of Alcohol Use 2. Current smoker (305.1) 3. Divorced 4. Tobacco use (305.1)  Review of Systems Genitourinary, constitutional, skin, eye, otolaryngeal, hematologic/lymphatic, cardiovascular, pulmonary, endocrine, musculoskeletal, gastrointestinal, neurological and psychiatric system(s) were reviewed and pertinent findings if present are noted.  Genitourinary: urinary frequency, feelings of urinary urgency, nocturia, difficulty starting the urinary stream, urinary stream starts and stops, erectile dysfunction and initiating urination requires straining.  Gastrointestinal: heartburn.  Constitutional: night sweats, feeling tired (fatigue) and recent weight loss.  Eyes: blurred vision.  Respiratory: shortness of breath and cough.    Vitals Height: 6 ft  Weight: 160 lb  BMI Calculated: 21.7 BSA Calculated: 1.94 Blood Pressure: 138 / 75 Heart Rate: 97  Physical Exam Constitutional: Well nourished and well developed . No acute distress.  ENT:. The ears and nose are normal in appearance.  Neck: The appearance of the neck is normal and no neck mass is present.  Pulmonary: No respiratory distress and normal respiratory rhythm and effort.  Cardiovascular: Heart rate and rhythm are normal . No peripheral edema.  Abdomen: The abdomen is soft and nontender. No masses are palpated. No CVA tenderness. No hernias are palpable. No hepatosplenomegaly noted.  Rectal: Rectal exam demonstrates normal sphincter tone, no tenderness and no masses. Estimated prostate size is 3+. The prostate has no nodularity and is not tender. The left seminal vesicle is nonpalpable. The right seminal vesicle is nonpalpable. The perineum is normal on inspection.  Genitourinary: Examination of the penis demonstrates no discharge, no masses, no lesions and a normal meatus. The penis is uncircumcised. The scrotum is without lesions. Examination of the  right scrotum demonstrates a  hydrocele. The left epididymis is palpably normal and non-tender. The right testis is nonpalpable. The left testis is non-tender and without masses.  Lymphatics: The femoral and inguinal nodes are not enlarged or tender.  Skin: Normal skin turgor, no visible rash and no visible skin lesions.  Neuro/Psych:. Mood and affect are appropriate.      Urodynamics 02/11/14: Study was performed to evaluate incomplete emptying of the bladder and LUTS.  His full urodynamic study was reviewed in its entirety as noted in chart.    Initial PVR: 475 cc CMG: He was found to have a cystometric capacity of 575 cc with a slightly elevated first sensation occurring at 360 cc. 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  Pressure-flow: He voided only 62 cc with a maximum flow rate of 4 cc/second and a detrusor pressure at maximum flow of 95 cm H2O.  Fluoroscopy: He had a highly trabeculated bladder with multiple pseudo-diverticuli.    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  Assessment Assessed  1. Incomplete bladder emptying (788.21) 2. BPH with obstruction/lower urinary tract symptoms (600.01) 3. Yeast UTI (112.2) 4. Pyuria (791.9)    We went over the results of the urodynamics study today. I discussed each of the findings of the study and the normal/anticipated results as well as the significance of these results in relation to the subjective symptoms.  I have discussed with him the fact that he has obstruction as the primary cause of his voiding symptoms. 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 We discussed the treatment options and he understands that outlet resistance shoulder surgery would result in marked improvement in his voiding but  may result in worsening urgency with urge incontinence as a remote possibility. I told him that once his outlet resistance was reduced I could potentially manage his detrusor instability with pharmacologic therapy. We discussed all the treatment options but primarily transurethral resection of the prostate.  I went over this procedure with him in detail including how the procedure was performed, the risks and complications, the probability of success and the need for an overnight hospital stay as well as the anticipated postoperative course. I have answered all of his questions to his satisfaction.    He did have some yeast in his urine at the time of his urodynamic study although today he just had primarily pyuria. I will place him on empiric fluconazole.  A preop urine culture done on 02/18/14 grew diphtheroids only consistent with contamination.    Plan  He'll be scheduled for TURP.

## 2014-03-17 NOTE — Transfer of Care (Signed)
Immediate Anesthesia Transfer of Care Note  Patient: [name]  Procedure(s) Performed: Procedure(s): TRANSURETHRAL RESECTION OF THE PROSTATE (TURP) WITH GYRUS (N/A)  Patient Location: PACU  Anesthesia Type:General  Level of Consciousness: awake, sedated and patient cooperative  Airway & Oxygen Therapy: Patient Spontanous Breathing and Patient connected to face mask oxygen  Post-op Assessment: Report given to PACU RN and Post -op Vital signs reviewed and stable  Post vital signs: Reviewed and stable  Complications: No apparent anesthesia complications

## 2014-03-17 NOTE — Anesthesia Postprocedure Evaluation (Signed)
Anesthesia Post Note  Patient: [name]  Procedure(s) Performed: Procedure(s) (LRB): TRANSURETHRAL RESECTION OF THE PROSTATE (TURP) WITH GYRUS (N/A)  Anesthesia type: General  Patient location: PACU  Post pain: Pain level controlled  Post assessment: Post-op Vital signs reviewed  Last Vitals: BP 139/82  Pulse 106  Temp(Src) 37.1 C (Oral)  Resp 16  Ht 6' (1.829 m)  Wt 154 lb (69.854 kg)  BMI 20.88 kg/m2  SpO2 99%  Post vital signs: Reviewed  Level of consciousness: sedated  Complications: No apparent anesthesia complications

## 2014-03-17 NOTE — Progress Notes (Addendum)
S: Pt c/o diffuse abd. pain without n/v. No spasms described.  O: AVSS Abd. Flat, soft with diffuse tenderness, no mass of peritoneal signs. Foley on traction and draining bloody urine - irrigates freely.  A: S/P TURP - Urine bloody but draining on CBI. Abd. Tenderness - unknown etiology. No issues occurred intraoperatively. Will monitor for now.  P: 1. Cont. CBI with hand irrigation PRN to keep clot free. 2. Pain medication. 3. Reassess in AM.  Add: Pre-op CXR showed a 10mm Rt. Mid lung shadow with need for further characterization. I have therefore ordered a CT of the chest.

## 2014-03-17 NOTE — Op Note (Signed)
PATIENT:  [name]  PRE-OPERATIVE DIAGNOSIS: BPH with outlet obstruction resulting in an elevated PVR   POST-OPERATIVE DIAGNOSIS: Same  PROCEDURE: TURP  SURGEON:  Merrick Maggio C Beauty Pless  INDICATION: [name] is a 67-year-old male with a known enlarged prostate who developed significant voiding symptoms consisting of frequency, urgency, weak stream and intermittency. He was found to have an elevated PVR of 504 mL with a normal creatinine and normal PSA. A trial of medical management did not result in improvement in his symptoms. Urodynamics were performed and revealed significant outlet obstruction with mild detrusor instability resulting in low-pressure contractions. We discussed the treatment options and he has elected to proceed with a TURP.   ANESTHESIA:  General  EBL:  Minimal  DRAINS: 24 French, 30 mL three-way Foley  LOCAL MEDICATIONS USED:  None  SPECIMEN:  Prostate chips to pathology  Description of procedure: After informed consent the patient was taken to the operating room and placed on the table in a supine position. General anesthesia was then administered. Once fully anesthetized the patient was moved to the dorsal lithotomy position and the genitalia were sterilely prepped and draped in standard fashion. An official timeout was then performed.  The 26 French resectoscope with visual obturator was then passed under direct vision down the urethra which was noted to be normal. The prostatic urethra revealed obstructing bilobar hypertrophy with a large median lobe component as well. Upon entering the bladder I noted 4+ trabeculation with multiple pseudo-diverticuli/cellules. The ureteral orifices were noted to be in the normal anatomic position and well away from the median lobe and bladder neck. No tumors, stones or inflammatory lesions were identified within the bladder.  The resectoscope element was inserted and I began by resecting the median lobe down to the level  of the bladder neck. I then resected the left lobe of the prostate from the level of the bladder neck back to the veru. I resected down to the surgical capsule and fulgurated bleeding vessels as they were encountered. After all of the left lobe was fully resected I then directed my attention to the right lobe and treated it in an identical fashion. I resected anterior tissue and then tissue that was obstructing at the apex but remained proximal to the veru at all times. I used the Microvasive evacuator to then remove all prostatic chips from the bladder and reinspection revealed no evidence of perforation or injury to the bladder. There were no prostatic chips seen within the bladder. Ureteral orifices were again identified and intact. There was no significant bleeding.  The 24 French three-way Foley catheter was then inserted in the bladder and the balloon filled with 30 mL of sterile water. It was connected to close system drainage and continuous irrigation and the irrigant was noted to be clear. The patient was therefore awakened and taken to the recovery room in stable and satisfactory condition. He tolerated the procedure well with no intraoperative complications.  PLAN OF CARE: He will be observed overnight with anticipated discharge in the morning.  PATIENT DISPOSITION:  PACU - hemodynamically stable.   

## 2014-03-17 NOTE — Discharge Instructions (Signed)

## 2014-03-17 NOTE — Op Note (Signed)
PATIENT:  Alan LewandowskyNathaniel Landry  PRE-OPERATIVE DIAGNOSIS: BPH with outlet obstruction resulting in an elevated PVR   POST-OPERATIVE DIAGNOSIS: Same  PROCEDURE: TURP  SURGEON:  Garnett FarmMark C [place] Hafen  INDICATION: [name] is a 68 year old male with a known enlarged prostate who developed significant voiding symptoms consisting of frequency, urgency, weak stream and intermittency. He was found to have an elevated PVR of 504 mL with a normal creatinine and normal PSA. A trial of medical management did not result in improvement in his symptoms. Urodynamics were performed and revealed significant outlet obstruction with mild detrusor instability resulting in low-pressure contractions. We discussed the treatment options and he has elected to proceed with a TURP.   ANESTHESIA:  General  EBL:  Minimal  DRAINS: 24 French, 30 mL three-way Foley  LOCAL MEDICATIONS USED:  None  SPECIMEN:  Prostate chips to pathology  Description of procedure: After informed consent the patient was taken to the operating room and placed on the table in a supine position. General anesthesia was then administered. Once fully anesthetized the patient was moved to the dorsal lithotomy position and the genitalia were sterilely prepped and draped in standard fashion. An official timeout was then performed.  The 6126 French resectoscope with visual obturator was then passed under direct vision down the urethra which was noted to be normal. The prostatic urethra revealed obstructing bilobar hypertrophy with a large median lobe component as well. Upon entering the bladder I noted 4+ trabeculation with multiple pseudo-diverticuli/cellules. The ureteral orifices were noted to be in the normal anatomic position and well away from the median lobe and bladder neck. No tumors, stones or inflammatory lesions were identified within the bladder.  The resectoscope element was inserted and I began by resecting the median lobe down to the level  of the bladder neck. I then resected the left lobe of the prostate from the level of the bladder neck back to the veru. I resected down to the surgical capsule and fulgurated bleeding vessels as they were encountered. After all of the left lobe was fully resected I then directed my attention to the right lobe and treated it in an identical fashion. I resected anterior tissue and then tissue that was obstructing at the apex but remained proximal to the veru at all times. I used the Microvasive evacuator to then remove all prostatic chips from the bladder and reinspection revealed no evidence of perforation or injury to the bladder. There were no prostatic chips seen within the bladder. Ureteral orifices were again identified and intact. There was no significant bleeding.  The 24 French three-way Foley catheter was then inserted in the bladder and the balloon filled with 30 mL of sterile water. It was connected to close system drainage and continuous irrigation and the irrigant was noted to be clear. The patient was therefore awakened and taken to the recovery room in stable and satisfactory condition. He tolerated the procedure well with no intraoperative complications.  PLAN OF CARE: He will be observed overnight with anticipated discharge in the morning.  PATIENT DISPOSITION:  PACU - hemodynamically stable.

## 2014-03-18 ENCOUNTER — Encounter (HOSPITAL_COMMUNITY): Payer: Self-pay | Admitting: Urology

## 2014-03-18 DIAGNOSIS — N401 Enlarged prostate with lower urinary tract symptoms: Secondary | ICD-10-CM | POA: Diagnosis not present

## 2014-03-18 MED ORDER — CIPROFLOXACIN HCL 500 MG PO TABS
500.0000 mg | ORAL_TABLET | Freq: Two times a day (BID) | ORAL | Status: DC
Start: 2014-03-18 — End: 2014-09-12

## 2014-03-18 MED ORDER — PHENAZOPYRIDINE HCL 200 MG PO TABS: 200.0000 mg | ORAL_TABLET | Freq: Three times a day (TID) | ORAL | Status: DC | PRN

## 2014-03-18 MED ORDER — OXYCODONE-ACETAMINOPHEN 10-325 MG PO TABS: 1.0000 | ORAL_TABLET | ORAL | Status: DC | PRN

## 2014-03-18 NOTE — Progress Notes (Signed)
Patient urinated three times. Urine pink and clear. Patient does not have any pain with urination. Will discharge at this time per MD order.

## 2014-03-18 NOTE — Discharge Summary (Signed)
Physician Discharge Summary  Patient ID: [name] MRN: [id number] DOB/AGE: 66-Jul-1947 66 y.o.  Admit date: 03/17/2014 Discharge date: 03/18/2014  Admission Diagnoses:1.  BPH with incomplete bladder emptying 2.  Suspected right lung nodule  Discharge Diagnoses:  Active Problems:   Benign prostatic hypertrophy (BPH) with incomplete bladder emptying   Discharged Condition: good  Hospital Course: He was electively taken to the operating room on 03/17/14 for transurethral resection of his prostate due to outlet obstruction and an elevated PVR.  His surgery proceeded without complication and the evening of his surgery he was noted to be doing well but was complaining of some lower abdominal pain.  That resolved with a bowel movement.  His Foley catheter has now been removed and once he voids spontaneously he will be discharged home. His preoperative chest x-ray revealed a questionable mid right lung nodule that was evaluated with a CT scan of the chest.  The area in question did not turn out to be a neoplasm although he did have some findings, that because he has been a smoker, indicate the need for a follow-up CT again in 1 year.  I will supply his primary care physician with this information for that follow-up.   Discharge Exam: Blood pressure 149/89, pulse 115, temperature 98.9 F (37.2 C), temperature source Oral, resp. rate 18, height 6' (1.829 m), weight 69.854 kg (154 lb), SpO2 100.00%. General appearance: alert, cooperative and no distress GI: soft, non-tender; bowel sounds normal; no masses,  no organomegaly  Disposition: He is felt to be ready for discharge at this time.  His catheter was removed about an hour ago and he has not urinated yet I told him that once he voided spontaneously to be discharged home.  I will continue him on antibiotics for a couple of days.  I will also supply a copy of his CT scan results to his primary care physician for further follow-up of the  findings noted on that study.      Discharge Instructions   Discharge patient    Complete by:  As directed             Medication List    STOP taking these medications       tamsulosin 0.4 MG Caps capsule  Commonly known as:  FLOMAX      TAKE these medications       amLODipine-benazepril 10-40 MG per capsule  Commonly known as:  LOTREL  Take 1 capsule by mouth every morning.     ciprofloxacin 500 MG tablet  Commonly known as:  CIPRO  Take 1 tablet (500 mg total) by mouth 2 (two) times daily.     fluconazole 200 MG tablet  Commonly known as:  DIFLUCAN  Take 200 mg by mouth every morning.     omeprazole 40 MG capsule  Commonly known as:  PRILOSEC  Take 40 mg by mouth every morning.     oxyCODONE-acetaminophen 10-325 MG per tablet  Commonly known as:  PERCOCET  Take 1-2 tablets by mouth every 4 (four) hours as needed for pain. Maximum dose per 24 hours - 8 pills     phenazopyridine 200 MG tablet  Commonly known as:  PYRIDIUM  Take 1 tablet (200 mg total) by mouth 3 (three) times daily as needed for pain.     pravastatin 40 MG tablet  Commonly known as:  PRAVACHOL  Take 40 mg by mouth at bedtime.     traZODone 150 MG tablet  Commonly known as:  DESYREL  Take 50 mg by mouth at bedtime.       Follow-up Information   Follow up with Garnett FarmTTELIN,Verneda Hollopeter C, MD On 03/25/2014. (at 9:00)    Specialty:  Urology   Contact information:   [address] [place] [address] [phone]       Signed: Garnett FarmOTTELIN,Zuleyka Kloc C 03/18/2014, 8:12 AM

## 2014-03-18 NOTE — Care Management Note (Addendum)
    Page 1 of 1   03/18/2014     11:40:12 AM CARE MANAGEMENT NOTE 03/18/2014  Patient:  Alan LewandowskyJOHNSON,Kashus   Account Number:  [id number]  Date Initiated:  03/18/2014  Documentation initiated by:  Lanier ClamMAHABIR,KATHY  Subjective/Objective Assessment:   67 Y/O M ADMITTED W/BPH     Action/Plan:   FROM HOME.   Anticipated DC Date:  03/18/2014   Anticipated DC Plan:  HOME/SELF CARE      DC Planning Services  CM consult      Choice offered to / List presented to:             Status of service:  Completed, signed off Medicare Important Message given?   (If response is "NO", the following Medicare IM given date fields will be blank) Date Medicare IM given:   Medicare IM given by:   Date Additional Medicare IM given:   Additional Medicare IM given by:    Discharge Disposition:  HOME/SELF CARE  Per UR Regulation:  Reviewed for med. necessity/level of care/duration of stay  If discussed at Long Length of Stay Meetings, dates discussed:    Comments:  03/18/14 KATHY MAHABIR RN,BSN NCM [phone] D/C HOME NO NEEDS OR ORDERS.

## 2014-09-07 ENCOUNTER — Emergency Department (HOSPITAL_COMMUNITY)
Admission: EM | Admit: 2014-09-07 | Discharge: 2014-09-08 | Disposition: A | Discharge status: Home / Self Care | Payer: Medicare Other | Source: Home / Self Care | Attending: Emergency Medicine | Admitting: Emergency Medicine

## 2014-09-07 ENCOUNTER — Encounter (HOSPITAL_COMMUNITY): Payer: Self-pay

## 2014-09-07 ENCOUNTER — Emergency Department (HOSPITAL_COMMUNITY): Payer: Medicare Other

## 2014-09-07 DIAGNOSIS — R1013 Epigastric pain: Secondary | ICD-10-CM

## 2014-09-07 DIAGNOSIS — M1990 Unspecified osteoarthritis, unspecified site: Secondary | ICD-10-CM | POA: Diagnosis present

## 2014-09-07 DIAGNOSIS — F17210 Nicotine dependence, cigarettes, uncomplicated: Secondary | ICD-10-CM | POA: Diagnosis present

## 2014-09-07 DIAGNOSIS — R55 Syncope and collapse: Secondary | ICD-10-CM | POA: Diagnosis not present

## 2014-09-07 DIAGNOSIS — Z8673 Personal history of transient ischemic attack (TIA), and cerebral infarction without residual deficits: Secondary | ICD-10-CM

## 2014-09-07 DIAGNOSIS — Z8711 Personal history of peptic ulcer disease: Secondary | ICD-10-CM

## 2014-09-07 DIAGNOSIS — M79606 Pain in leg, unspecified: Secondary | ICD-10-CM | POA: Diagnosis present

## 2014-09-07 DIAGNOSIS — K219 Gastro-esophageal reflux disease without esophagitis: Secondary | ICD-10-CM | POA: Diagnosis present

## 2014-09-07 DIAGNOSIS — I951 Orthostatic hypotension: Secondary | ICD-10-CM | POA: Diagnosis not present

## 2014-09-07 DIAGNOSIS — I10 Essential (primary) hypertension: Secondary | ICD-10-CM | POA: Diagnosis present

## 2014-09-07 DIAGNOSIS — F1193 Opioid use, unspecified with withdrawal: Secondary | ICD-10-CM | POA: Diagnosis present

## 2014-09-07 DIAGNOSIS — R3914 Feeling of incomplete bladder emptying: Secondary | ICD-10-CM | POA: Diagnosis present

## 2014-09-07 DIAGNOSIS — F1410 Cocaine abuse, uncomplicated: Secondary | ICD-10-CM | POA: Diagnosis present

## 2014-09-07 DIAGNOSIS — K279 Peptic ulcer, site unspecified, unspecified as acute or chronic, without hemorrhage or perforation: Secondary | ICD-10-CM | POA: Insufficient documentation

## 2014-09-07 DIAGNOSIS — N179 Acute kidney failure, unspecified: Secondary | ICD-10-CM | POA: Diagnosis present

## 2014-09-07 DIAGNOSIS — N401 Enlarged prostate with lower urinary tract symptoms: Secondary | ICD-10-CM | POA: Diagnosis present

## 2014-09-07 HISTORY — DX: Peptic ulcer, site unspecified, unspecified as acute or chronic, without hemorrhage or perforation: K27.9

## 2014-09-07 LAB — I-STAT CHEM 8, ED
BUN: 7 mg/dL (ref 6–23)
CREATININE: 0.6 mg/dL (ref 0.50–1.35)
Calcium, Ion: 1.19 mmol/L (ref 1.13–1.30)
Chloride: 101 mmol/L (ref 96–112)
Glucose, Bld: 140 mg/dL — ABNORMAL HIGH (ref 70–99)
HCT: 53 % — ABNORMAL HIGH (ref 39.0–52.0)
Hemoglobin: 18 g/dL — ABNORMAL HIGH (ref 13.0–17.0)
POTASSIUM: 3.6 mmol/L (ref 3.5–5.1)
Sodium: 140 mmol/L (ref 135–145)
TCO2: 24 mmol/L (ref 0–100)

## 2014-09-07 LAB — CBC WITH DIFFERENTIAL/PLATELET
BASOS PCT: 0 % (ref 0–1)
Basophils Absolute: 0 10*3/uL (ref 0.0–0.1)
EOS ABS: 0 10*3/uL (ref 0.0–0.7)
Eosinophils Relative: 0 % (ref 0–5)
HCT: 47.2 % (ref 39.0–52.0)
HEMOGLOBIN: 15.4 g/dL (ref 13.0–17.0)
Lymphocytes Relative: 7 % — ABNORMAL LOW (ref 12–46)
Lymphs Abs: 0.8 10*3/uL (ref 0.7–4.0)
MCH: 29.1 pg (ref 26.0–34.0)
MCHC: 32.6 g/dL (ref 30.0–36.0)
MCV: 89.1 fL (ref 78.0–100.0)
MONO ABS: 0.5 10*3/uL (ref 0.1–1.0)
MONOS PCT: 4 % (ref 3–12)
NEUTROS ABS: 9.5 10*3/uL — AB (ref 1.7–7.7)
Neutrophils Relative %: 89 % — ABNORMAL HIGH (ref 43–77)
Platelets: 360 10*3/uL (ref 150–400)
RBC: 5.3 MIL/uL (ref 4.22–5.81)
RDW: 13.3 % (ref 11.5–15.5)
WBC: 10.7 10*3/uL — ABNORMAL HIGH (ref 4.0–10.5)

## 2014-09-07 LAB — HEPATIC FUNCTION PANEL
ALBUMIN: 4.4 g/dL (ref 3.5–5.2)
ALK PHOS: 116 U/L (ref 39–117)
ALT: 26 U/L (ref 0–53)
AST: 29 U/L (ref 0–37)
Bilirubin, Direct: 0.2 mg/dL (ref 0.0–0.5)
Indirect Bilirubin: 0.3 mg/dL (ref 0.3–0.9)
TOTAL PROTEIN: 9.7 g/dL — AB (ref 6.0–8.3)
Total Bilirubin: 0.5 mg/dL (ref 0.3–1.2)

## 2014-09-07 LAB — URINALYSIS, ROUTINE W REFLEX MICROSCOPIC
Bilirubin Urine: NEGATIVE
Glucose, UA: 100 mg/dL — AB
HGB URINE DIPSTICK: NEGATIVE
Ketones, ur: NEGATIVE mg/dL
LEUKOCYTES UA: NEGATIVE
NITRITE: NEGATIVE
PROTEIN: NEGATIVE mg/dL
Specific Gravity, Urine: 1.017 (ref 1.005–1.030)
UROBILINOGEN UA: 1 mg/dL (ref 0.0–1.0)
pH: 7 (ref 5.0–8.0)

## 2014-09-07 LAB — I-STAT TROPONIN, ED: Troponin i, poc: 0 ng/mL (ref 0.00–0.08)

## 2014-09-07 LAB — LIPASE, BLOOD: LIPASE: 18 U/L (ref 11–59)

## 2014-09-07 LAB — POC OCCULT BLOOD, ED: Fecal Occult Bld: NEGATIVE

## 2014-09-07 MED ORDER — HYDROMORPHONE HCL 1 MG/ML IJ SOLN
1.0000 mg | Freq: Once | INTRAMUSCULAR | Status: AC
  Administered 2014-09-07: 1 mg via INTRAVENOUS
  Filled 2014-09-07: qty 1

## 2014-09-07 MED ORDER — MORPHINE SULFATE 4 MG/ML IJ SOLN
4.0000 mg | Freq: Once | INTRAMUSCULAR | Status: AC
  Administered 2014-09-07: 4 mg via INTRAVENOUS
  Filled 2014-09-07: qty 1

## 2014-09-07 MED ORDER — PROMETHAZINE HCL 25 MG/ML IJ SOLN
25.0000 mg | Freq: Once | INTRAMUSCULAR | Status: AC
  Administered 2014-09-07: 25 mg via INTRAMUSCULAR
  Filled 2014-09-07: qty 1

## 2014-09-07 MED ORDER — PANTOPRAZOLE SODIUM 40 MG IV SOLR
40.0000 mg | Freq: Once | INTRAVENOUS | Status: AC
  Administered 2014-09-07: 40 mg via INTRAVENOUS
  Filled 2014-09-07: qty 40

## 2014-09-07 MED ORDER — OMEPRAZOLE 40 MG PO CPDR
40.0000 mg | DELAYED_RELEASE_CAPSULE | ORAL | Status: DC
Start: 2014-09-07 — End: 2020-01-17

## 2014-09-07 MED ORDER — RANITIDINE HCL 150 MG PO CAPS: 150.0000 mg | ORAL_CAPSULE | Freq: Every day | ORAL | Status: DC

## 2014-09-07 MED ORDER — IOHEXOL 300 MG/ML  SOLN
100.0000 mL | Freq: Once | INTRAMUSCULAR | Status: AC | PRN
  Administered 2014-09-07: 100 mL via INTRAVENOUS

## 2014-09-07 MED ORDER — ONDANSETRON HCL 4 MG/2ML IJ SOLN
4.0000 mg | Freq: Once | INTRAMUSCULAR | Status: AC
  Administered 2014-09-07: 4 mg via INTRAVENOUS
  Filled 2014-09-07: qty 2

## 2014-09-07 MED ORDER — AMLODIPINE BESYLATE 10 MG PO TABS
10.0000 mg | ORAL_TABLET | Freq: Once | ORAL | Status: AC
  Administered 2014-09-07: 10 mg via ORAL
  Filled 2014-09-07: qty 1

## 2014-09-07 NOTE — ED Provider Notes (Signed)
CSN: [id number]     Arrival date & time 09/07/14  1755 History   First MD Initiated Contact with Patient 09/07/14 1834     Chief Complaint  Patient presents with  . Abdominal Pain     (Consider location/radiation/quality/duration/timing/severity/associated sxs/prior Treatment) HPI   69 year old male with history of GERD, PUD, hypertension presenting for evaluation of abdominal pain. Patient reports for the past 2 days he has had persistent upper abdominal pain. He described pain as a dull and achy sensation, unrelenting, he is unable to tolerate any by mouth. Eating and drinking worsen his pain. He has vomited up yellow emesis. He reported having normal bowel movement without black tarry stool or blood. His vomitus is nonbloody nonbilious. No chest pain or shortness of breath, no dysuria, no hernia, no rash. He tries taking Alka-Seltzer but unable to keep it down. He denies any prior abdominal surgery. PCP: [facility].  Past Medical History  Diagnosis Date  . Hypertension   . GERD (gastroesophageal reflux disease)   . Arthritis   . Peptic ulcer disease    Past Surgical History  Procedure Laterality Date  . Lung surgery  2008     right   . Transurethral resection of prostate N/A 03/17/2014    Procedure: TRANSURETHRAL RESECTION OF THE PROSTATE (TURP) WITH GYRUS;  Surgeon: Garnett Farm, MD;  Location: [facility];  Service: Urology;  Laterality: N/A;   No family history on file. History  Substance Use Topics  . Smoking status: Current Every Day Smoker -- 0.50 packs/day for 35 years    Types: Cigarettes  . Smokeless tobacco: Never Used  . Alcohol Use: No     Comment: hx of etoh use- 5 years ago     Review of Systems  All other systems reviewed and are negative.     Allergies  Review of patient's allergies indicates no known allergies.  Home Medications   Prior to Admission medications   Medication Sig Start Date End Date Taking? Authorizing Provider   amLODipine-benazepril (LOTREL) 10-40 MG per capsule Take 1 capsule by mouth every morning.   Yes Historical Provider, MD  fluconazole (DIFLUCAN) 200 MG tablet Take 200 mg by mouth every morning.   Yes Historical Provider, MD  omeprazole (PRILOSEC) 40 MG capsule Take 40 mg by mouth every morning.   Yes Historical Provider, MD  pravastatin (PRAVACHOL) 40 MG tablet Take 40 mg by mouth at bedtime.   Yes Historical Provider, MD  tamsulosin (FLOMAX) 0.4 MG CAPS capsule Take 1 capsule by mouth at bedtime. 08/24/14  Yes Historical Provider, MD  traZODone (DESYREL) 150 MG tablet Take 50 mg by mouth at bedtime.   Yes Historical Provider, MD  ciprofloxacin (CIPRO) 500 MG tablet Take 1 tablet (500 mg total) by mouth 2 (two) times daily. 03/18/14   Ihor Gully, MD  oxyCODONE-acetaminophen (PERCOCET) 10-325 MG per tablet Take 1-2 tablets by mouth every 4 (four) hours as needed for pain. Maximum dose per 24 hours - 8 pills 03/18/14   Ihor Gully, MD  phenazopyridine (PYRIDIUM) 200 MG tablet Take 1 tablet (200 mg total) by mouth 3 (three) times daily as needed for pain. 03/18/14   Ihor Gully, MD   BP 156/88 mmHg  Pulse 98  Temp(Src) 98.2 F (36.8 C) (Oral)  Resp 16  SpO2 99% Physical Exam  Constitutional: He is oriented to person, place, and time. He appears well-developed and well-nourished. No distress.  Frail-appearing African-American male, nontoxic in appearance  HENT:  Head: Atraumatic.  Eyes: Conjunctivae are normal.  Neck: Normal range of motion. Neck supple.  Cardiovascular: Normal rate and regular rhythm.   Pulmonary/Chest: Effort normal and breath sounds normal. No respiratory distress. He has no wheezes. He has no rales. He exhibits no tenderness.  Abdominal: Soft. He exhibits no distension. There is tenderness (Epigastric and periumbilical tenderness on palpation with guarding but without rebound tenderness. No abdominal bruit noted.).  Musculoskeletal: He exhibits no edema.   Neurological: He is alert and oriented to person, place, and time.  Skin: No rash noted.  Psychiatric: He has a normal mood and affect.  Nursing note and vitals reviewed.   ED Course  Procedures (including critical care time)  Patient here with upper abdominal pain and associate nausea or vomiting. He does have reproducible abdominal tenderness on exam. He'll require a CT scan further evaluation. Workup initiated pain medication given.  10:27 PM Care discussed with Dr. Madilyn Hook who did evaluate pt.  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  Does not think he has heroin withdrawal.  His labs are reassuring, negative hemoccult, abd/pelvis CT is without acute finding. Suspect pain is related to PUD/gastritis.  Pt does have a PCP which he can f/u  Recommend avoid alcohol and street drugs.    Prior to discharge, patient's blood pressure is elevated at 193/102. Patient admits that he has not been able to keep his normal blood pressure medication down. Will give him blood pressure medication will monitor in the meantime.  Otherwise suspect elevated BP is due to not taking his meds.  No active CP.  Doubt hypertensive Urgency/Emergency.  Discussed with DR. Madilyn Hook who agrees.    Labs Review Labs Reviewed  CBC WITH DIFFERENTIAL/PLATELET - Abnormal; Notable for the following:    WBC 10.7 (*)    Neutrophils Relative % 89 (*)    Neutro Abs 9.5 (*)    Lymphocytes Relative 7 (*)    All other components within normal limits  URINALYSIS, ROUTINE W REFLEX MICROSCOPIC - Abnormal; Notable for the following:    Glucose, UA 100 (*)    All other components within normal limits  HEPATIC FUNCTION PANEL - Abnormal; Notable for the following:    Total Protein 9.7 (*)    All other components within normal limits  I-STAT CHEM 8, ED - Abnormal; Notable for the following:    Glucose, Bld 140 (*)    Hemoglobin 18.0 (*)    HCT 53.0 (*)    All other components within normal limits  LIPASE, BLOOD  I-STAT TROPOININ,  ED  POC OCCULT BLOOD, ED    Imaging Review Ct Abdomen Pelvis W Contrast  09/07/2014   CLINICAL DATA:  Several episodes of nausea and vomiting +upper abdominal pain, discomfort for 2 days, remote history of peptic ulcer disease  EXAM: CT ABDOMEN AND PELVIS WITH CONTRAST  TECHNIQUE: Multidetector CT imaging of the abdomen and pelvis was performed using the standard protocol following bolus administration of intravenous contrast.  CONTRAST:  OMNIPAQUE IOHEXOL 300 MG/ML  SOLN  COMPARISON:  04/20/2004  FINDINGS: Mild steatosis of the liver. Gallbladder is normal. Spleen is normal. Pancreas is normal. Mild fullness of the adrenal glands, stable, consistent with mild adrenal hyperplasia. No significant abnormalities in either kidney. Tiny midpole cyst on the left. Bilateral renal hilum vascular calcification.  Moderate calcification to severe calcification of the aortoiliac vessels. Partially visualized scrotal hydrocele. Bladder normal.  Nonobstructive bowel gas pattern. Stomach, small bowel, and large bowel show no acute findings.  Visualized portions of the lung bases clear. No acute osseous abnormalities. Significant disc bulges from L2-3 through L5-S1, with evidence of disc extrusion to were the left at L5-S1.  IMPRESSION: No acute findings.   Electronically Signed   By: Esperanza Heiraymond  Rubner M.D.   On: 09/07/2014 22:03     EKG Interpretation   Date/Time:  Sunday September 07 2014 20:10:22 EDT Ventricular Rate:  83 PR Interval:  166 QRS Duration: 83 QT Interval:  391 QTC Calculation: 459 R Axis:   70 Text Interpretation:  Sinus rhythm Anteroseptal infarct, old Borderline  repolarization abnormality Confirmed by Lincoln Brighamees, Liz [phone]([id number]) on 09/07/2014  8:25:02 PM      MDM   Final diagnoses:  Peptic ulcer disease  Epigastric pain    BP 193/102 mmHg  Pulse 89  Temp(Src) 98.2 F (36.8 C) (Oral)  Resp 18  SpO2 98%  I have reviewed nursing notes and vital signs. I personally reviewed the imaging  tests through PACS system  I reviewed available ER/hospitalization records thought the EMR     Fayrene HelperBowie Paislea Hatton, PA-C 09/07/14 2316  Tilden FossaElizabeth Rees, MD 09/07/14 2330

## 2014-09-07 NOTE — ED Notes (Signed)
Pt is unable to urinate at this time. x2

## 2014-09-07 NOTE — ED Notes (Signed)
Pt given urinal and made aware of need for urine specimen 

## 2014-09-07 NOTE — Discharge Instructions (Signed)
Peptic Ulcer A peptic ulcer is a sore in the lining of your esophagus (esophageal ulcer), stomach (gastric ulcer), or in the first part of your small intestine (duodenal ulcer). The ulcer causes erosion into the deeper tissue. CAUSES  Normally, the lining of the stomach and the small intestine protects itself from the acid that digests food. The protective lining can be damaged by:  An infection caused by a bacterium called Helicobacter pylori (H. pylori).  Regular use of nonsteroidal anti-inflammatory drugs (NSAIDs), such as ibuprofen or aspirin.  Smoking tobacco. Other risk factors include being older than 50, drinking alcohol excessively, and having a family history of ulcer disease.  SYMPTOMS   Burning pain or gnawing in the area between the chest and the belly button.  Heartburn.  Nausea and vomiting.  Bloating. The pain can be worse on an empty stomach and at night. If the ulcer results in bleeding, it can cause:  Black, tarry stools.  Vomiting of bright red blood.  Vomiting of coffee-ground-looking materials. DIAGNOSIS  A diagnosis is usually made based upon your history and an exam. Other tests and procedures may be performed to find the cause of the ulcer. Finding a cause will help determine the best treatment. Tests and procedures may include:  Blood tests, stool tests, or breath tests to check for the bacterium H. pylori.  An upper gastrointestinal (GI) series of the esophagus, stomach, and small intestine.  An endoscopy to examine the esophagus, stomach, and small intestine.  A biopsy. TREATMENT  Treatment may include:  Eliminating the cause of the ulcer, such as smoking, NSAIDs, or alcohol.  Medicines to reduce the amount of acid in your digestive tract.  Antibiotic medicines if the ulcer is caused by the H. pylori bacterium.  An upper endoscopy to treat a bleeding ulcer.  Surgery if the bleeding is severe or if the ulcer created a hole somewhere in the  digestive system. HOME CARE INSTRUCTIONS   Avoid tobacco, alcohol, and caffeine. Smoking can increase the acid in the stomach, and continued smoking will impair the healing of ulcers.  Avoid foods and drinks that seem to cause discomfort or aggravate your ulcer.  Only take medicines as directed by your caregiver. Do not substitute over-the-counter medicines for prescription medicines without talking to your caregiver.  Keep any follow-up appointments and tests as directed. SEEK MEDICAL CARE IF:   Your do not improve within 7 days of starting treatment.  You have ongoing indigestion or heartburn. SEEK IMMEDIATE MEDICAL CARE IF:   You have sudden, sharp, or persistent abdominal pain.  You have bloody or dark black, tarry stools.  You vomit blood or vomit that looks like coffee grounds.  You become light-headed, weak, or feel faint.  You become sweaty or clammy. MAKE SURE YOU:   Understand these instructions.  Will watch your condition.  Will get help right away if you are not doing well or get worse. Document Released: 05/06/2000 Document Revised: 09/23/2013 Document Reviewed: 12/07/2011 Exit[employer] Patient Information 2015 [employer]. This information is not intended to replace advice given to you by your health care provider. Make sure you discuss any questions you have with your health care provider.  

## 2014-09-07 NOTE — ED Notes (Signed)
He c/o several episodes of n/v plus upper abd. Discomfort x 2 days.  He is in no distress.  He cites remote hx of peptic ulcer dis.

## 2014-09-08 NOTE — ED Notes (Signed)
Patient with Hx of HTN, reports no meds in two days, denies pain, visual changes, lightheadedness, dizziness, ok per PA to d/c.

## 2014-09-09 ENCOUNTER — Encounter (HOSPITAL_COMMUNITY): Payer: Self-pay

## 2014-09-09 ENCOUNTER — Inpatient Hospital Stay (HOSPITAL_COMMUNITY)
Admission: EM | Admit: 2014-09-09 | Discharge: 2014-09-12 | DRG: 312 | Disposition: A | Discharge status: Home / Self Care | Payer: Medicare Other | Attending: Internal Medicine | Admitting: Internal Medicine

## 2014-09-09 ENCOUNTER — Emergency Department (HOSPITAL_COMMUNITY): Payer: Medicare Other

## 2014-09-09 DIAGNOSIS — R3914 Feeling of incomplete bladder emptying: Secondary | ICD-10-CM | POA: Diagnosis present

## 2014-09-09 DIAGNOSIS — N179 Acute kidney failure, unspecified: Secondary | ICD-10-CM | POA: Diagnosis present

## 2014-09-09 DIAGNOSIS — R55 Syncope and collapse: Secondary | ICD-10-CM | POA: Diagnosis present

## 2014-09-09 DIAGNOSIS — I10 Essential (primary) hypertension: Secondary | ICD-10-CM | POA: Diagnosis present

## 2014-09-09 DIAGNOSIS — K219 Gastro-esophageal reflux disease without esophagitis: Secondary | ICD-10-CM | POA: Diagnosis present

## 2014-09-09 DIAGNOSIS — Z8673 Personal history of transient ischemic attack (TIA), and cerebral infarction without residual deficits: Secondary | ICD-10-CM | POA: Diagnosis not present

## 2014-09-09 DIAGNOSIS — Z8711 Personal history of peptic ulcer disease: Secondary | ICD-10-CM | POA: Diagnosis not present

## 2014-09-09 DIAGNOSIS — N19 Unspecified kidney failure: Secondary | ICD-10-CM

## 2014-09-09 DIAGNOSIS — I951 Orthostatic hypotension: Secondary | ICD-10-CM | POA: Diagnosis present

## 2014-09-09 DIAGNOSIS — R531 Weakness: Secondary | ICD-10-CM

## 2014-09-09 DIAGNOSIS — M25559 Pain in unspecified hip: Secondary | ICD-10-CM

## 2014-09-09 DIAGNOSIS — M79606 Pain in leg, unspecified: Secondary | ICD-10-CM | POA: Diagnosis present

## 2014-09-09 DIAGNOSIS — F1910 Other psychoactive substance abuse, uncomplicated: Secondary | ICD-10-CM | POA: Diagnosis present

## 2014-09-09 DIAGNOSIS — F1193 Opioid use, unspecified with withdrawal: Secondary | ICD-10-CM | POA: Diagnosis present

## 2014-09-09 DIAGNOSIS — R112 Nausea with vomiting, unspecified: Secondary | ICD-10-CM | POA: Diagnosis present

## 2014-09-09 DIAGNOSIS — N401 Enlarged prostate with lower urinary tract symptoms: Secondary | ICD-10-CM | POA: Diagnosis present

## 2014-09-09 DIAGNOSIS — F17210 Nicotine dependence, cigarettes, uncomplicated: Secondary | ICD-10-CM | POA: Diagnosis present

## 2014-09-09 DIAGNOSIS — F1410 Cocaine abuse, uncomplicated: Secondary | ICD-10-CM | POA: Diagnosis present

## 2014-09-09 DIAGNOSIS — M1990 Unspecified osteoarthritis, unspecified site: Secondary | ICD-10-CM | POA: Diagnosis present

## 2014-09-09 LAB — BASIC METABOLIC PANEL
ANION GAP: 13 (ref 5–15)
BUN: 45 mg/dL — ABNORMAL HIGH (ref 6–23)
CALCIUM: 9.6 mg/dL (ref 8.4–10.5)
CO2: 25 mmol/L (ref 19–32)
Chloride: 102 mmol/L (ref 96–112)
Creatinine, Ser: 3.91 mg/dL — ABNORMAL HIGH (ref 0.50–1.35)
GFR, EST AFRICAN AMERICAN: 17 mL/min — AB (ref >90)
GFR, EST NON AFRICAN AMERICAN: 15 mL/min — AB (ref >90)
Glucose, Bld: 116 mg/dL — ABNORMAL HIGH (ref 70–99)
Potassium: 4.5 mmol/L (ref 3.5–5.1)
Sodium: 140 mmol/L (ref 135–145)

## 2014-09-09 LAB — CBC
HCT: 44.3 % (ref 39.0–52.0)
HEMOGLOBIN: 14.2 g/dL (ref 13.0–17.0)
MCH: 29 pg (ref 26.0–34.0)
MCHC: 32.1 g/dL (ref 30.0–36.0)
MCV: 90.6 fL (ref 78.0–100.0)
PLATELETS: 389 10*3/uL (ref 150–400)
RBC: 4.89 MIL/uL (ref 4.22–5.81)
RDW: 13.7 % (ref 11.5–15.5)
WBC: 13.8 10*3/uL — ABNORMAL HIGH (ref 4.0–10.5)

## 2014-09-09 LAB — CBG MONITORING, ED: GLUCOSE-CAPILLARY: 117 mg/dL — AB (ref 70–99)

## 2014-09-09 MED ORDER — SODIUM CHLORIDE 0.9 % IV BOLUS (SEPSIS)
1000.0000 mL | Freq: Once | INTRAVENOUS | Status: AC
  Administered 2014-09-09: 1000 mL via INTRAVENOUS

## 2014-09-09 NOTE — ED Notes (Signed)
Pt presents with c/o dizziness, blurred vision, and tingling in both arms. Pt reports his symptoms started this morning. Pt reports he gets dizzy whenever he moves from a sitting position, has fallen once today already.

## 2014-09-09 NOTE — ED Notes (Signed)
Dr. Jenkins at bedside. 

## 2014-09-09 NOTE — ED Provider Notes (Signed)
CSN: [id number]     Arrival date & time 09/09/14  2013 History   First MD Initiated Contact with Patient 09/09/14 2045     Chief Complaint  Patient presents with  . Dizziness  . Blurred Vision     (Consider location/radiation/quality/duration/timing/severity/associated sxs/prior Treatment) HPI.... Patient was evaluated in the emergency department on 09/07/2014 with a diagnosis of peptic ulcer disease. He now complains of  lightheadedness, dizziness with standing, tingling in his hands, blurred vision. He apparently had a syncopal spell earlier today. Past history includes heroin and alcohol usage. No chest pain, dyspnea, dysuria, fever, chills, cough.  Severity is moderate to severe  Past Medical History  Diagnosis Date  . Hypertension   . GERD (gastroesophageal reflux disease)   . Arthritis   . Peptic ulcer disease    Past Surgical History  Procedure Laterality Date  . Lung surgery  2008     right   . Transurethral resection of prostate N/A 03/17/2014    Procedure: TRANSURETHRAL RESECTION OF THE PROSTATE (TURP) WITH GYRUS;  Surgeon: Garnett Farm, MD;  Location: [facility];  Service: Urology;  Laterality: N/A;   No family history on file. History  Substance Use Topics  . Smoking status: Current Every Day Smoker -- 0.50 packs/day for 35 years    Types: Cigarettes  . Smokeless tobacco: Never Used  . Alcohol Use: No     Comment: hx of etoh use- 5 years ago     Review of Systems  All other systems reviewed and are negative.     Allergies  Review of patient's allergies indicates no known allergies.  Home Medications   Prior to Admission medications   Medication Sig Start Date End Date Taking? Authorizing Provider  amLODipine-benazepril (LOTREL) 10-40 MG per capsule Take 1 capsule by mouth every morning.   Yes Historical Provider, MD  fluconazole (DIFLUCAN) 200 MG tablet Take 200 mg by mouth every morning.   Yes Historical Provider, MD  omeprazole (PRILOSEC) 40 MG capsule  Take 1 capsule (40 mg total) by mouth every morning. 09/07/14  Yes Fayrene Helper, PA-C  pravastatin (PRAVACHOL) 40 MG tablet Take 40 mg by mouth at bedtime.   Yes Historical Provider, MD  tamsulosin (FLOMAX) 0.4 MG CAPS capsule Take 1 capsule by mouth at bedtime. 08/24/14  Yes Historical Provider, MD  traZODone (DESYREL) 150 MG tablet Take 150 mg by mouth at bedtime.    Yes Historical Provider, MD  ciprofloxacin (CIPRO) 500 MG tablet Take 1 tablet (500 mg total) by mouth 2 (two) times daily. Patient not taking: Reported on 09/09/2014 03/18/14   Ihor Gully, MD  oxyCODONE-acetaminophen (PERCOCET) 10-325 MG per tablet Take 1-2 tablets by mouth every 4 (four) hours as needed for pain. Maximum dose per 24 hours - 8 pills Patient not taking: Reported on 09/09/2014 03/18/14   Ihor Gully, MD  phenazopyridine (PYRIDIUM) 200 MG tablet Take 1 tablet (200 mg total) by mouth 3 (three) times daily as needed for pain. Patient not taking: Reported on 09/09/2014 03/18/14   Ihor Gully, MD  ranitidine (ZANTAC) 150 MG capsule Take 1 capsule (150 mg total) by mouth daily. Patient not taking: Reported on 09/09/2014 09/07/14   Fayrene Helper, PA-C   BP 112/63 mmHg  Pulse 81  Temp(Src) 97.5 F (36.4 C) (Oral)  Resp 16  SpO2 97% Physical Exam  Constitutional: He is oriented to person, place, and time.  Thin, ill-appearing  HENT:  Head: Normocephalic and atraumatic.  Eyes: Conjunctivae and EOM are normal.  Pupils are equal, round, and reactive to light.  Neck: Normal range of motion. Neck supple.  Cardiovascular: Normal rate and regular rhythm.   Pulmonary/Chest: Effort normal and breath sounds normal.  Abdominal: Soft. Bowel sounds are normal.  Musculoskeletal: Normal range of motion.  Neurological: He is alert and oriented to person, place, and time.  Skin: Skin is warm and dry.  Psychiatric: He has a normal mood and affect. His behavior is normal.  Nursing note and vitals reviewed.   ED Course  Procedures  (including critical care time) Labs Review Labs Reviewed  CBC - Abnormal; Notable for the following:    WBC 13.8 (*)    All other components within normal limits  BASIC METABOLIC PANEL - Abnormal; Notable for the following:    Glucose, Bld 116 (*)    BUN 45 (*)    Creatinine, Ser 3.91 (*)    GFR calc non Af Amer 15 (*)    GFR calc Af Amer 17 (*)    All other components within normal limits  CBG MONITORING, ED - Abnormal; Notable for the following:    Glucose-Capillary 117 (*)    All other components within normal limits  URINALYSIS, ROUTINE W REFLEX MICROSCOPIC  URINE RAPID DRUG SCREEN (HOSP PERFORMED)    Imaging Review Ct Head Wo Contrast  09/09/2014   CLINICAL DATA:  Dizziness, blurry vision, extremity tingling beginning this morning. Dizziness when moving from assisting position. Fall today. History of hypertension.  EXAM: CT HEAD WITHOUT CONTRAST  TECHNIQUE: Contiguous axial images were obtained from the base of the skull through the vertex without intravenous contrast.  COMPARISON:  CT of the head January 05, 2005  FINDINGS: No intraparenchymal hemorrhage, mass effect, midline shift nor acute large vascular territory infarct. Re- demonstration of RIGHT thalamus lacunar infarct. Resolution of LEFT basal ganglia hemorrhage with focal encephalomalacia. Patchy supratentorial white matter hypodensities.  No abnormal extra-axial fluid collections. Moderate calcific atherosclerosis the carotid siphons and included vertebral arteries. Ocular globes and orbital contents are unremarkable. Paranasal sinuses and mastoid air cells are well aerated. Small RIGHT frontal scalp lipoma.  IMPRESSION: No acute intracranial process.  Involutional changes. Remote RIGHT thalamus, LEFT basal ganglia infarcts. Moderate white matter changes suggest chronic small vessel ischemic disease.   Electronically Signed   By: Awilda Metroourtnay  Bloomer   On: 09/09/2014 22:31     EKG Interpretation   Date/Time:  Tuesday September 09 2014 20:25:20 EDT Ventricular Rate:  98 PR Interval:  144 QRS Duration: 86 QT Interval:  364 QTC Calculation: 465 R Axis:   73 Text Interpretation:  Sinus rhythm Atrial premature complex Probable left  atrial enlargement Low voltage, extremity leads Probable left ventricular  hypertrophy Confirmed by Adriana SimasOOK  MD, Yovan Leeman ([id number]) on 09/09/2014 11:07:53 PM  Also confirmed by Adriana SimasOOK  MD, Juanita Streight ([id number])  on 09/09/2014 11:23:34 PM      MDM   Final diagnoses:  Weakness  Syncope, unspecified syncope type  Renal failure   Patient has had a syncopal spell. EKG normal. Hemoglobin stable. Glucose normal. Creatinine has elevated to 3.91. CT head negative. IV fluids. Admit.     Donnetta HutchingBrian Shermar Friedland, MD 09/10/14 [phone]

## 2014-09-10 DIAGNOSIS — R3914 Feeling of incomplete bladder emptying: Secondary | ICD-10-CM

## 2014-09-10 DIAGNOSIS — R112 Nausea with vomiting, unspecified: Secondary | ICD-10-CM

## 2014-09-10 DIAGNOSIS — I951 Orthostatic hypotension: Principal | ICD-10-CM

## 2014-09-10 DIAGNOSIS — N401 Enlarged prostate with lower urinary tract symptoms: Secondary | ICD-10-CM

## 2014-09-10 DIAGNOSIS — F1910 Other psychoactive substance abuse, uncomplicated: Secondary | ICD-10-CM | POA: Diagnosis present

## 2014-09-10 DIAGNOSIS — N179 Acute kidney failure, unspecified: Secondary | ICD-10-CM

## 2014-09-10 DIAGNOSIS — R55 Syncope and collapse: Secondary | ICD-10-CM

## 2014-09-10 LAB — URINALYSIS, ROUTINE W REFLEX MICROSCOPIC
Bilirubin Urine: NEGATIVE
GLUCOSE, UA: NEGATIVE mg/dL
Hgb urine dipstick: NEGATIVE
KETONES UR: NEGATIVE mg/dL
Nitrite: NEGATIVE
PROTEIN: 30 mg/dL — AB
Specific Gravity, Urine: 1.022 (ref 1.005–1.030)
UROBILINOGEN UA: 1 mg/dL (ref 0.0–1.0)
pH: 5 (ref 5.0–8.0)

## 2014-09-10 LAB — CBC
HCT: 37.3 % — ABNORMAL LOW (ref 39.0–52.0)
Hemoglobin: 11.7 g/dL — ABNORMAL LOW (ref 13.0–17.0)
MCH: 28.3 pg (ref 26.0–34.0)
MCHC: 31.4 g/dL (ref 30.0–36.0)
MCV: 90.1 fL (ref 78.0–100.0)
Platelets: 306 10*3/uL (ref 150–400)
RBC: 4.14 MIL/uL — ABNORMAL LOW (ref 4.22–5.81)
RDW: 13.7 % (ref 11.5–15.5)
WBC: 8.9 10*3/uL (ref 4.0–10.5)

## 2014-09-10 LAB — BASIC METABOLIC PANEL
Anion gap: 8 (ref 5–15)
BUN: 36 mg/dL — ABNORMAL HIGH (ref 6–23)
CO2: 21 mmol/L (ref 19–32)
CREATININE: 2.34 mg/dL — AB (ref 0.50–1.35)
Calcium: 8.4 mg/dL (ref 8.4–10.5)
Chloride: 108 mmol/L (ref 96–112)
GFR calc non Af Amer: 27 mL/min — ABNORMAL LOW (ref >90)
GFR, EST AFRICAN AMERICAN: 32 mL/min — AB (ref >90)
Glucose, Bld: 107 mg/dL — ABNORMAL HIGH (ref 70–99)
POTASSIUM: 3.6 mmol/L (ref 3.5–5.1)
SODIUM: 137 mmol/L (ref 135–145)

## 2014-09-10 LAB — URINE MICROSCOPIC-ADD ON

## 2014-09-10 LAB — RAPID URINE DRUG SCREEN, HOSP PERFORMED
Amphetamines: NOT DETECTED
BENZODIAZEPINES: NOT DETECTED
Barbiturates: NOT DETECTED
Cocaine: POSITIVE — AB
OPIATES: POSITIVE — AB
Tetrahydrocannabinol: NOT DETECTED

## 2014-09-10 LAB — CK: Total CK: 177 U/L (ref 7–232)

## 2014-09-10 MED ORDER — ENSURE ENLIVE PO LIQD
237.0000 mL | Freq: Two times a day (BID) | ORAL | Status: DC
  Administered 2014-09-10 – 2014-09-12 (×5): 237 mL via ORAL

## 2014-09-10 MED ORDER — SODIUM CHLORIDE 0.9 % IV SOLN
INTRAVENOUS | Status: DC
  Administered 2014-09-10: 23:00:00 via INTRAVENOUS
  Administered 2014-09-10: 100 mL/h via INTRAVENOUS
  Administered 2014-09-10 – 2014-09-11 (×2): via INTRAVENOUS

## 2014-09-10 MED ORDER — TAMSULOSIN HCL 0.4 MG PO CAPS
0.4000 mg | ORAL_CAPSULE | Freq: Every day | ORAL | Status: DC
  Administered 2014-09-10 – 2014-09-11 (×3): 0.4 mg via ORAL
  Filled 2014-09-10 (×3): qty 1

## 2014-09-10 MED ORDER — SODIUM CHLORIDE 0.9 % IJ SOLN
3.0000 mL | Freq: Two times a day (BID) | INTRAMUSCULAR | Status: DC
  Administered 2014-09-10 – 2014-09-11 (×2): 3 mL via INTRAVENOUS

## 2014-09-10 MED ORDER — FLUCONAZOLE 200 MG PO TABS
200.0000 mg | ORAL_TABLET | Freq: Every day | ORAL | Status: DC
  Administered 2014-09-10 – 2014-09-12 (×3): 200 mg via ORAL
  Filled 2014-09-10 (×3): qty 1

## 2014-09-10 MED ORDER — OXYCODONE HCL 5 MG PO TABS
5.0000 mg | ORAL_TABLET | ORAL | Status: DC | PRN
  Administered 2014-09-10: 5 mg via ORAL
  Filled 2014-09-10: qty 1

## 2014-09-10 MED ORDER — TRAZODONE HCL 150 MG PO TABS
150.0000 mg | ORAL_TABLET | Freq: Every day | ORAL | Status: DC
  Administered 2014-09-10 – 2014-09-11 (×3): 150 mg via ORAL
  Filled 2014-09-10 (×3): qty 1

## 2014-09-10 MED ORDER — LORAZEPAM 2 MG/ML IJ SOLN: 1.0000 mg | Freq: Four times a day (QID) | INTRAMUSCULAR | Status: DC | PRN

## 2014-09-10 MED ORDER — AMLODIPINE BESYLATE 10 MG PO TABS
10.0000 mg | ORAL_TABLET | Freq: Every day | ORAL | Status: DC
  Administered 2014-09-10 – 2014-09-12 (×3): 10 mg via ORAL
  Filled 2014-09-10 (×3): qty 1

## 2014-09-10 MED ORDER — HYDROMORPHONE HCL 1 MG/ML IJ SOLN
0.5000 mg | INTRAMUSCULAR | Status: DC | PRN
  Administered 2014-09-10: 1 mg via INTRAVENOUS
  Filled 2014-09-10: qty 1

## 2014-09-10 MED ORDER — AMLODIPINE BESY-BENAZEPRIL HCL 10-40 MG PO CAPS: 1.0000 | ORAL_CAPSULE | ORAL | Status: DC

## 2014-09-10 MED ORDER — LORAZEPAM 1 MG PO TABS
0.0000 mg | ORAL_TABLET | Freq: Four times a day (QID) | ORAL | Status: AC
  Administered 2014-09-10: 2 mg via ORAL
  Filled 2014-09-10: qty 2

## 2014-09-10 MED ORDER — ACETAMINOPHEN 325 MG PO TABS: 650.0000 mg | ORAL_TABLET | Freq: Four times a day (QID) | ORAL | Status: DC | PRN

## 2014-09-10 MED ORDER — ACETAMINOPHEN 650 MG RE SUPP: 650.0000 mg | Freq: Four times a day (QID) | RECTAL | Status: DC | PRN

## 2014-09-10 MED ORDER — ONDANSETRON HCL 4 MG/2ML IJ SOLN
4.0000 mg | Freq: Four times a day (QID) | INTRAMUSCULAR | Status: DC | PRN
  Administered 2014-09-10 – 2014-09-11 (×4): 4 mg via INTRAVENOUS
  Filled 2014-09-10 (×4): qty 2

## 2014-09-10 MED ORDER — THIAMINE HCL 100 MG/ML IJ SOLN
100.0000 mg | Freq: Every day | INTRAMUSCULAR | Status: DC
  Filled 2014-09-10 (×3): qty 1

## 2014-09-10 MED ORDER — VITAMIN B-1 100 MG PO TABS
100.0000 mg | ORAL_TABLET | Freq: Every day | ORAL | Status: DC
  Administered 2014-09-10 – 2014-09-12 (×3): 100 mg via ORAL
  Filled 2014-09-10 (×3): qty 1

## 2014-09-10 MED ORDER — MUSCLE RUB 10-15 % EX CREA
TOPICAL_CREAM | CUTANEOUS | Status: DC | PRN
Start: 2014-09-10 — End: 2014-09-12
  Administered 2014-09-10: 23:00:00 via TOPICAL
  Administered 2014-09-11 (×2): 1 via TOPICAL
  Filled 2014-09-10: qty 85

## 2014-09-10 MED ORDER — ALUM & MAG HYDROXIDE-SIMETH 200-200-20 MG/5ML PO SUSP: 30.0000 mL | Freq: Four times a day (QID) | ORAL | Status: DC | PRN

## 2014-09-10 MED ORDER — NICOTINE 7 MG/24HR TD PT24
7.0000 mg | MEDICATED_PATCH | Freq: Every day | TRANSDERMAL | Status: DC
  Administered 2014-09-10 – 2014-09-12 (×3): 7 mg via TRANSDERMAL
  Filled 2014-09-10 (×3): qty 1

## 2014-09-10 MED ORDER — SODIUM CHLORIDE 0.9 % IV SOLN: INTRAVENOUS | Status: DC

## 2014-09-10 MED ORDER — HEPARIN SODIUM (PORCINE) 5000 UNIT/ML IJ SOLN
5000.0000 [IU] | Freq: Three times a day (TID) | INTRAMUSCULAR | Status: DC
  Administered 2014-09-10 – 2014-09-12 (×8): 5000 [IU] via SUBCUTANEOUS
  Filled 2014-09-10 (×9): qty 1

## 2014-09-10 MED ORDER — LORAZEPAM 1 MG PO TABS: 0.0000 mg | ORAL_TABLET | Freq: Two times a day (BID) | ORAL | Status: DC

## 2014-09-10 MED ORDER — ADULT MULTIVITAMIN W/MINERALS CH
1.0000 | ORAL_TABLET | Freq: Every day | ORAL | Status: DC
  Administered 2014-09-10 – 2014-09-12 (×3): 1 via ORAL
  Filled 2014-09-10 (×3): qty 1

## 2014-09-10 MED ORDER — OXYCODONE HCL 5 MG PO TABS
5.0000 mg | ORAL_TABLET | ORAL | Status: DC | PRN
  Administered 2014-09-10 – 2014-09-12 (×9): 10 mg via ORAL
  Filled 2014-09-10 (×9): qty 2

## 2014-09-10 MED ORDER — PNEUMOCOCCAL VAC POLYVALENT 25 MCG/0.5ML IJ INJ
0.5000 mL | INJECTION | INTRAMUSCULAR | Status: AC
  Administered 2014-09-11: 0.5 mL via INTRAMUSCULAR
  Filled 2014-09-10 (×2): qty 0.5

## 2014-09-10 MED ORDER — PRAVASTATIN SODIUM 40 MG PO TABS
40.0000 mg | ORAL_TABLET | Freq: Every day | ORAL | Status: DC
  Administered 2014-09-10: 40 mg via ORAL
  Filled 2014-09-10: qty 1

## 2014-09-10 MED ORDER — LORAZEPAM 1 MG PO TABS: 1.0000 mg | ORAL_TABLET | Freq: Four times a day (QID) | ORAL | Status: DC | PRN

## 2014-09-10 MED ORDER — PANTOPRAZOLE SODIUM 40 MG PO TBEC
40.0000 mg | DELAYED_RELEASE_TABLET | Freq: Every day | ORAL | Status: DC
  Administered 2014-09-10 – 2014-09-12 (×3): 40 mg via ORAL
  Filled 2014-09-10 (×3): qty 1

## 2014-09-10 MED ORDER — FOLIC ACID 1 MG PO TABS
1.0000 mg | ORAL_TABLET | Freq: Every day | ORAL | Status: DC
  Administered 2014-09-10 – 2014-09-12 (×3): 1 mg via ORAL
  Filled 2014-09-10 (×3): qty 1

## 2014-09-10 MED ORDER — BENAZEPRIL HCL 40 MG PO TABS
40.0000 mg | ORAL_TABLET | Freq: Every day | ORAL | Status: DC
Start: 2014-09-10 — End: 2014-09-10
  Administered 2014-09-10: 40 mg via ORAL
  Filled 2014-09-10: qty 1

## 2014-09-10 MED ORDER — ONDANSETRON HCL 4 MG PO TABS: 4.0000 mg | ORAL_TABLET | Freq: Four times a day (QID) | ORAL | Status: DC | PRN

## 2014-09-10 NOTE — H&P (Addendum)
[facility] Admission History and Physical       [name] WUJ:[id number] DOB: Apr 11, 1946 DOA: 09/09/2014  Referring physician: EDP PCP: Seen at [facility] Specialists:   Chief Complaint: Dizziness  HPI: [name] is a 69 y.o. male who presented to the ED with complaints of increased Dizziness and blurred vision since the AM, and falling after got out of his truck and stood up and felt as if he would pass out.   He does report having nausea and vomiting x 2 days he denies any diarrhea or fevers or chills.   He was evaluated in the ED and was found to have hypotension which improved with hydration, and he was found to have an elevated BUN/Cr of 45/3.91.    A CT scan of the Head was performed and was negative for acute findings.      Review of Systems: Unable to Obtain from the Patient Constitutional: No Weight Loss, No Weight Gain, Night Sweats, Fevers, Chills, Dizziness, +Light Headedness, Fatigue, or Generalized Weakness HEENT: No Headaches, Difficulty Swallowing,Tooth/Dental Problems,Sore Throat,  No Sneezing, Rhinitis, Ear Ache, Nasal Congestion, or Post Nasal Drip,  Cardio-vascular:  No Chest pain, Orthopnea, PND, Edema in Lower Extremities, Anasarca, Dizziness, Palpitations  Resp: No Dyspnea, No DOE, No Productive Cough, No Non-Productive Cough, No Hemoptysis, No Wheezing.    GI: No Heartburn, Indigestion, Abdominal Pain, +Nausea, +Vomiting, Diarrhea, Constipation, Hematemesis, Hematochezia, Melena, Change in Bowel Habits,  Loss of Appetite  GU: No Dysuria, No Change in Color of Urine, No Urgency or Urinary Frequency, No Flank pain.  Musculoskeletal: No Joint Pain or Swelling, No Decreased Range of Motion, No Back Pain.  Neurologic: +Syncope, No Seizures, Muscle Weakness, Paresthesia, Vision Disturbance or Loss, No Diplopia, No Vertigo, No Difficulty Walking,  Skin: No Rash or Lesions. Psych: No Change in Mood or Affect, No  Depression or Anxiety, No Memory loss, No Confusion, or Hallucinations   Past Medical History  Diagnosis Date  . Hypertension   . GERD (gastroesophageal reflux disease)   . Arthritis   . Peptic ulcer disease      Past Surgical History  Procedure Laterality Date  . Lung surgery  2008     right   . Transurethral resection of prostate N/A 03/17/2014    Procedure: TRANSURETHRAL RESECTION OF THE PROSTATE (TURP) WITH GYRUS;  Surgeon: Garnett Farm, MD;  Location: [facility];  Service: Urology;  Laterality: N/A;      Prior to Admission medications   Medication Sig Start Date End Date Taking? Authorizing Provider  amLODipine-benazepril (LOTREL) 10-40 MG per capsule Take 1 capsule by mouth every morning.   Yes Historical Provider, MD  fluconazole (DIFLUCAN) 200 MG tablet Take 200 mg by mouth every morning.   Yes Historical Provider, MD  omeprazole (PRILOSEC) 40 MG capsule Take 1 capsule (40 mg total) by mouth every morning. 09/07/14  Yes Fayrene Helper, PA-C  pravastatin (PRAVACHOL) 40 MG tablet Take 40 mg by mouth at bedtime.   Yes Historical Provider, MD  tamsulosin (FLOMAX) 0.4 MG CAPS capsule Take 1 capsule by mouth at bedtime. 08/24/14  Yes Historical Provider, MD  traZODone (DESYREL) 150 MG tablet Take 150 mg by mouth at bedtime.    Yes Historical Provider, MD  ciprofloxacin (CIPRO) 500 MG tablet Take 1 tablet (500 mg total) by mouth 2 (two) times daily. Patient not taking: Reported on 09/09/2014 03/18/14   Ihor Gully, MD  oxyCODONE-acetaminophen (PERCOCET) 10-325 MG per tablet Take 1-2 tablets by  mouth every 4 (four) hours as needed for pain. Maximum dose per 24 hours - 8 pills Patient not taking: Reported on 09/09/2014 03/18/14   Ihor GullyMark Ottelin, MD  phenazopyridine (PYRIDIUM) 200 MG tablet Take 1 tablet (200 mg total) by mouth 3 (three) times daily as needed for pain. Patient not taking: Reported on 09/09/2014 03/18/14   Ihor GullyMark Ottelin, MD  ranitidine (ZANTAC) 150 MG capsule Take 1 capsule (150 mg  total) by mouth daily. Patient not taking: Reported on 09/09/2014 09/07/14   Fayrene HelperBowie Tran, PA-C     No Known Allergies  Social History:  reports that he has been smoking Cigarettes.  He has a 17.5 pack-year smoking history. He has never used smokeless tobacco. He reports that he uses illicit drugs (Heroin). He reports that he does not drink alcohol.       Physical Exam:  GEN:  Pleasant  Thin  69 y.o. African American male examined and in no acute distress; cooperative with exam Filed Vitals:   09/09/14 2202 09/09/14 2312 09/09/14 2330 09/10/14 0000  BP: 99/59 112/63 130/95 117/62  Pulse:  81 93 75  Temp:      TempSrc:      Resp: 16 16 21 12   SpO2: 100% 97% 97% 99%   Blood pressure 117/62, pulse 75, temperature 97.5 F (36.4 C), temperature source Oral, resp. rate 12, SpO2 99 %. PSYCH: He is alert and oriented x4; does not appear anxious does not appear depressed; affect is normal HEENT: Normocephalic and Atraumatic, Mucous membranes pink; PERRLA; EOM intact; Fundi:  Benign;  No scleral icterus, Nares: Patent, Oropharynx: Clear,  Fair Dentition,    Neck:  FROM, No Cervical Lymphadenopathy nor Thyromegaly or Carotid Bruit; No JVD; Breasts:: Not examined CHEST WALL: No tenderness CHEST: Normal respiration, clear to auscultation bilaterally HEART: Regular rate and rhythm; no murmurs rubs or gallops BACK: No kyphosis or scoliosis; No CVA tenderness ABDOMEN: Positive Bowel Sounds, Scaphoid, Soft Non-Tender, No Rebound or Guarding; No Masses, No Organomegaly Rectal Exam: Not done EXTREMITIES: No Cyanosis, Clubbing, or Edema; No Ulcerations. Genitalia: not examined PULSES: 2+ and symmetric SKIN: Normal hydration no rash or ulceration CNS:  Alert and Oriented x 4, No Focal Deficits Vascular: pulses palpable throughout    Labs on Admission:  Basic Metabolic Panel:  Recent Labs Lab 09/07/14 1917 09/09/14 2052  NA 140 140  K 3.6 4.5  CL 101 102  CO2  --  25  GLUCOSE 140* 116*    BUN 7 45*  CREATININE 0.60 3.91*  CALCIUM  --  9.6   Liver Function Tests:  Recent Labs Lab 09/07/14 1907  AST 29  ALT 26  ALKPHOS 116  BILITOT 0.5  PROT 9.7*  ALBUMIN 4.4    Recent Labs Lab 09/07/14 1907  LIPASE 18   No results for input(s): AMMONIA in the last 168 hours. CBC:  Recent Labs Lab 09/07/14 1907 09/07/14 1917 09/09/14 2052  WBC 10.7*  --  13.8*  NEUTROABS 9.5*  --   --   HGB 15.4 18.0* 14.2  HCT 47.2 53.0* 44.3  MCV 89.1  --  90.6  PLT 360  --  389   Cardiac Enzymes: No results for input(s): CKTOTAL, CKMB, CKMBINDEX, TROPONINI in the last 168 hours.  BNP (last 3 results) No results for input(s): BNP in the last 8760 hours.  ProBNP (last 3 results) No results for input(s): PROBNP in the last 8760 hours.  CBG:  Recent Labs Lab 09/09/14 2025  GLUCAP 117*  Radiological Exams on Admission: Ct Head Wo Contrast  09/09/2014   CLINICAL DATA:  Dizziness, blurry vision, extremity tingling beginning this morning. Dizziness when moving from assisting position. Fall today. History of hypertension.  EXAM: CT HEAD WITHOUT CONTRAST  TECHNIQUE: Contiguous axial images were obtained from the base of the skull through the vertex without intravenous contrast.  COMPARISON:  CT of the head January 05, 2005  FINDINGS: No intraparenchymal hemorrhage, mass effect, midline shift nor acute large vascular territory infarct. Re- demonstration of RIGHT thalamus lacunar infarct. Resolution of LEFT basal ganglia hemorrhage with focal encephalomalacia. Patchy supratentorial white matter hypodensities.  No abnormal extra-axial fluid collections. Moderate calcific atherosclerosis the carotid siphons and included vertebral arteries. Ocular globes and orbital contents are unremarkable. Paranasal sinuses and mastoid air cells are well aerated. Small RIGHT frontal scalp lipoma.  IMPRESSION: No acute intracranial process.  Involutional changes. Remote RIGHT thalamus, LEFT basal ganglia  infarcts. Moderate white matter changes suggest chronic small vessel ischemic disease.   Electronically Signed   By: Awilda Metro   On: 09/09/2014 22:31     EKG: Independently reviewed.    Assessment/Plan:   69 y.o. male with  Principal Problem:   1.   AKI (acute kidney injury)   IVFs   Monitor BUN/Cr   Active Problems:   2.   Syncope -  More like PreSyncope due to #3   IVFs   Telemetry Monitoring   Check Orthostatic Vitals q shift x 4     3.   Orthostatic hypotension   IVFs for Rehydration     4.   Nausea and vomiting   PRN IV Zofran     5.   Benign prostatic hypertrophy (BPH) with incomplete bladder emptying   Continue Vesicare Rx      6.    Polysubstance Abuse   +UDS for Cocaine and Opiates   Counseled      7.   DVT Prophylaxis    SQ Heparin          Code Status:     FULL CODE     Family Communication:    No Family Present    Disposition Plan:    Inpatient Status        Time spent:  27  Minutes      Ron Parker [facility] Pager [phone]   If 7AM -7PM Please Contact the Day Rounding Team MD for [facility]  If 7PM-7AM, Please Contact Night-Floor Coverage  [email or web address] Password [email or web address] 09/10/2014, 12:18 AM     ADDENDUM:   Patient was seen and examined on 09/10/2014

## 2014-09-10 NOTE — Progress Notes (Addendum)
Progress Note   [name] AVW:[id number] DOB: 17-Feb-1946 DOA: 09/09/2014 PCP: No primary care provider on file.  Confirmed.  Used to go to [facility] and see Dr. Nils Pyle.   Brief Narrative:   [name] is an 69 y.o. male with a PMH of polysubstance abuse who was admitted on 09/09/14 with chief complaint of dizziness, blurred vision, presyncope/syncope with fall after having a 2 day history of nausea/vomiting. Upon initial evaluation in the ED, the patient was hypotensive with acute renal failure.  Assessment/Plan:   Principal Problem:   Syncope / orthostatic hypotension - Likely from orthostatic hypotension given his 2 day history of nausea/vomiting. - Continue to hydrate. - Continue to monitor on telemetry. - Physical therapy evaluation when stable. - CT of the head negative for acute findings.  Active Problems:   Cerebrovascular disease - Remote thalamic and basal ganglia stroke noted on CT of the head. - Needs risk factor modification. - Check FLP. Blood pressure controlled. - Consider adding low-dose aspirin after physical therapy evaluation if not a high fall risk.    Hypertension  - Controlled on Norvasc.     Leg pain - CK checked. No evidence of rhabdomyolysis. Analgesics and supportive care with a heating pad/topical muscle rubs.    Benign prostatic hypertrophy (BPH) with incomplete bladder emptying - Continue Vesicare.    AKI (acute kidney injury) - Likely prerenal in etiology given history of nausea/vomiting. - Hydrate and monitor. If no improvement with hydration, obtain renal ultrasound.    Nausea and vomiting - Supportive care with IV fluids and antinausea medicines.    Polysubstance abuse - Counseled. IV narcotics discontinued. Avoid further IV narcotics. - Has been noted to be pain medication seeking. - Currently on Ativan detox protocol.    DVT prophylaxis - Heparin ordered.  Code Status: Full. Family Communication: No  family currently at the bedside. Mikolaj Woolstenhulme (brother) is his emergency contact. Declines my offer to call. Disposition Plan: Home when stable likely 24-48 hours if syncope does not return, no arrhythmic events noted on telemetry and pending physical therapy evaluation.   IV Access:    Peripheral IV   Procedures and diagnostic studies:   Ct Head Wo Contrast 09/09/2014: No acute intracranial process.  Involutional changes. Remote RIGHT thalamus, LEFT basal ganglia infarcts. Moderate white matter changes suggest chronic small vessel ischemic disease.     Medical Consultants:    None.  Anti-Infectives:    None.  Subjective:   [name] reports bilateral leg pain for the last 3-4 days, describes it as "excrutiating".  No history of similar pain. No current complaints of nausea, vomiting or dizziness. Appetite good.   Objective:    Filed Vitals:   09/10/14 0059 09/10/14 0131 09/10/14 0507 09/10/14 1406  BP:  120/77 104/57   Pulse: 78 79 71   Temp:  98.5 F (36.9 C) 98.2 F (36.8 C) 98.2 F (36.8 C)  TempSrc:   Oral Oral  Resp:  16 14   Height:  6' (1.829 m)    Weight:  65.7 kg (144 lb 13.5 oz)    SpO2:  100% 100%     Intake/Output Summary (Last 24 hours) at 09/10/14 1436 Last data filed at 09/10/14 8295  Gross per 24 hour  Intake      0 ml  Output    350 ml  Net   -350 ml    Exam: Gen:  NAD Cardiovascular:  RRR, No M/R/G Respiratory:  Lungs CTAB Gastrointestinal:  Abdomen soft, NT/ND, + BS Extremities:  No C/E/C   Data Reviewed:    Labs: Basic Metabolic Panel:  Recent Labs Lab 09/07/14 1917 09/09/14 2052 09/10/14 0332  NA 140 140 137  K 3.6 4.5 3.6  CL 101 102 108  CO2  --  25 21  GLUCOSE 140* 116* 107*  BUN 7 45* 36*  CREATININE 0.60 3.91* 2.34*  CALCIUM  --  9.6 8.4   GFR Estimated Creatinine Clearance: 28.9 mL/min (by C-G formula based on Cr of 2.34). Liver Function Tests:  Recent Labs Lab 09/07/14 1907  AST 29  ALT 26   ALKPHOS 116  BILITOT 0.5  PROT 9.7*  ALBUMIN 4.4    Recent Labs Lab 09/07/14 1907  LIPASE 18   CBC:  Recent Labs Lab 09/07/14 1907 09/07/14 1917 09/09/14 2052 09/10/14 0332  WBC 10.7*  --  13.8* 8.9  NEUTROABS 9.5*  --   --   --   HGB 15.4 18.0* 14.2 11.7*  HCT 47.2 53.0* 44.3 37.3*  MCV 89.1  --  90.6 90.1  PLT 360  --  389 306   CBG:  Recent Labs Lab 09/09/14 2025  GLUCAP 117*   Microbiology No results found for this or any previous visit (from the past 240 hour(s)).   Medications:   . amLODipine  10 mg Oral Daily  . benazepril  40 mg Oral Daily  . feeding supplement (ENSURE ENLIVE)  237 mL Oral BID BM  . fluconazole  200 mg Oral Daily  . folic acid  1 mg Oral Daily  . heparin  5,000 Units Subcutaneous 3 times per day  . LORazepam  0-4 mg Oral Q6H   Followed by  . [START ON 09/12/2014] LORazepam  0-4 mg Oral Q12H  . multivitamin with minerals  1 tablet Oral Daily  . nicotine  7 mg Transdermal Daily  . pantoprazole  40 mg Oral Daily  . [START ON 09/11/2014] pneumococcal 23 valent vaccine  0.5 mL Intramuscular Tomorrow-1000  . pravastatin  40 mg Oral QHS  . sodium chloride  3 mL Intravenous Q12H  . tamsulosin  0.4 mg Oral QHS  . thiamine  100 mg Oral Daily   Or  . thiamine  100 mg Intravenous Daily  . traZODone  150 mg Oral QHS   Continuous Infusions: . sodium chloride 100 mL/hr (09/10/14 1034)    Time spent: 35 minutes with > 50% of time discussing current diagnostic test results, clinical impression and plan of care.    LOS: 1 day   Riot Waterworth  [facility] Pager [phone]. If unable to reach me by pager, please call my cell phone at [phone].  *Please refer to [email or web address], password [email or web address] to get updated schedule on who will round on this patient, as hospitalists switch teams weekly. If 7PM-7AM, please contact night-coverage at [email or web address], password [email or web address] for any overnight needs.  09/10/2014, 2:36 PM

## 2014-09-10 NOTE — Progress Notes (Signed)
INITIAL NUTRITION ASSESSMENT  Pt meets criteria for NON-SEVERE (moderate) MALNUTRITION in the context of chronic illness as evidenced by mild/moderate muscle wasting and 31 lbs weight loss (18% body weight) in 6 months . DOCUMENTATION CODES Per approved criteria  -Non-severe (moderate) malnutrition in the context of chronic illness   INTERVENTION: - Continue current diet - Will order Ensure Enlive po BID, each supplement provides 350 kcal and 20 grams of protein   NUTRITION DIAGNOSIS: Inadequate protein-energy intake related to decreased appetite and intakes as evidenced by pt report, lack of appetite with nausea and vomiting x6 days PTA.   Goal: Pt to meet >= 90% estimated needs  Monitor:  Meal and supplement intakes, weight trends, labs, I/O's  Reason for Assessment: Malnutrition Screening Tool  69 y.o. male  Admitting Dx: AKI (acute kidney injury)  ASSESSMENT: Pt is a 69 year old with past medical hx of HTN, GERD, peptic ulcer disease, and arthritis. He was noted to have nausea and vomiting PTA with abdominal pain x2 days PTA.  Pt seen for MST. He reports nausea and vomiting since last Thursday but that he has not had any vomiting since admission. He reports ongoing nausea. Pt reports he had eggs, grits, bacon and coffee for breakfast this AM and was able to eat a small amount.   He denies chewing/swallowing issues and has dentures. Pt does not drink nutrition supplements at home. Not meeting needs. Labs and medications have been reviewed; BUN/creatinine now trending down, GFR: 32.  Nutrition Focused Physical Exam:  Subcutaneous Fat:  Orbital Region: WDL Upper Arm Region: WDL Thoracic and Lumbar Region: WDL  Muscle:  Temple Region: mild/moderate weight loss Clavicle Bone Region: mild/moderate weight loss Clavicle and Acromion Bone Region: WDL Scapular Bone Region: WDL Dorsal Hand: WDL Patellar Region: WDL Anterior Thigh Region: WDL Posterior Calf Region:  WDL  Edema: none present    Height: Ht Readings from Last 1 Encounters:  09/10/14 6' (1.829 m)    Weight: Wt Readings from Last 1 Encounters:  09/10/14 144 lb 13.5 oz (65.7 kg)    Ideal Body Weight: 178 lbs (80.91 kg)  % Ideal Body Weight: 81%  Wt Readings from Last 10 Encounters:  09/10/14 144 lb 13.5 oz (65.7 kg)  07/13/07 160 lb 6.4 oz (72.757 kg)  05/23/07 152 lb 9.6 oz (69.219 kg)  04/12/07 162 lb 8 oz (73.71 kg)  04/05/07 159 lb 1.6 oz (72.167 kg)    Usual Body Weight: 175 lbs (79.54 kg)  % Usual Body Weight: 82%  BMI:  Body mass index is 19.64 kg/(m^2).  Estimated Nutritional Needs: Kcal: 1600-1800 Protein: 70-90 grams Fluid: 2L/day  Skin: WDL  Diet Order: Diet regular Room service appropriate?: Yes; Fluid consistency:: Thin  EDUCATION NEEDS: -No education needs identified at this time   Intake/Output Summary (Last 24 hours) at 09/10/14 1240 Last data filed at 09/10/14 [id number]  Gross per 24 hour  Intake      0 ml  Output    350 ml  Net   -350 ml    Last BM: PTA   Labs:   Recent Labs Lab 09/07/14 1917 09/09/14 2052 09/10/14 0332  NA 140 140 137  K 3.6 4.5 3.6  CL 101 102 108  CO2  --  25 21  BUN 7 45* 36*  CREATININE 0.60 3.91* 2.34*  CALCIUM  --  9.6 8.4  GLUCOSE 140* 116* 107*    CBG (last 3)   Recent Labs  09/09/14 2025  GLUCAP 117*  Scheduled Meds: . amLODipine  10 mg Oral Daily  . benazepril  40 mg Oral Daily  . feeding supplement (ENSURE ENLIVE)  237 mL Oral BID BM  . fluconazole  200 mg Oral Daily  . folic acid  1 mg Oral Daily  . heparin  5,000 Units Subcutaneous 3 times per day  . LORazepam  0-4 mg Oral Q6H   Followed by  . [START ON 09/12/2014] LORazepam  0-4 mg Oral Q12H  . multivitamin with minerals  1 tablet Oral Daily  . nicotine  7 mg Transdermal Daily  . pantoprazole  40 mg Oral Daily  . [START ON 09/11/2014] pneumococcal 23 valent vaccine  0.5 mL Intramuscular Tomorrow-1000  . pravastatin  40 mg Oral  QHS  . sodium chloride  3 mL Intravenous Q12H  . tamsulosin  0.4 mg Oral QHS  . thiamine  100 mg Oral Daily   Or  . thiamine  100 mg Intravenous Daily  . traZODone  150 mg Oral QHS    Continuous Infusions: . sodium chloride 100 mL/hr (09/10/14 1034)    Past Medical History  Diagnosis Date  . Hypertension   . GERD (gastroesophageal reflux disease)   . Arthritis   . Peptic ulcer disease     Past Surgical History  Procedure Laterality Date  . Lung surgery  2008     right   . Transurethral resection of prostate N/A 03/17/2014    Procedure: TRANSURETHRAL RESECTION OF THE PROSTATE (TURP) WITH GYRUS;  Surgeon: Garnett Farm, MD;  Location: [facility];  Service: Urology;  Laterality: N/A;    Trenton Gammon, RD, LDN Inpatient Clinical Dietitian Pager # [phone] After hours/weekend pager # [phone]

## 2014-09-11 ENCOUNTER — Inpatient Hospital Stay (HOSPITAL_COMMUNITY): Payer: Medicare Other

## 2014-09-11 DIAGNOSIS — I951 Orthostatic hypotension: Secondary | ICD-10-CM | POA: Diagnosis not present

## 2014-09-11 LAB — LIPID PANEL
Cholesterol: 122 mg/dL (ref 0–200)
HDL: 34 mg/dL — ABNORMAL LOW (ref >39)
LDL Cholesterol: 66 mg/dL (ref 0–99)
Total CHOL/HDL Ratio: 3.6 RATIO
Triglycerides: 108 mg/dL (ref <150)
VLDL: 22 mg/dL (ref 0–40)

## 2014-09-11 LAB — COMPREHENSIVE METABOLIC PANEL
ALK PHOS: 77 U/L (ref 39–117)
ALT: 19 U/L (ref 0–53)
ANION GAP: 7 (ref 5–15)
AST: 22 U/L (ref 0–37)
Albumin: 3.4 g/dL — ABNORMAL LOW (ref 3.5–5.2)
BUN: 14 mg/dL (ref 6–23)
CHLORIDE: 107 mmol/L (ref 96–112)
CO2: 25 mmol/L (ref 19–32)
Calcium: 9 mg/dL (ref 8.4–10.5)
Creatinine, Ser: 0.83 mg/dL (ref 0.50–1.35)
GFR calc non Af Amer: 90 mL/min — ABNORMAL LOW (ref >90)
Glucose, Bld: 127 mg/dL — ABNORMAL HIGH (ref 70–99)
Potassium: 3.7 mmol/L (ref 3.5–5.1)
Sodium: 139 mmol/L (ref 135–145)
TOTAL PROTEIN: 7.5 g/dL (ref 6.0–8.3)
Total Bilirubin: 0.5 mg/dL (ref 0.3–1.2)

## 2014-09-11 MED ORDER — CLONIDINE HCL 0.1 MG PO TABS
0.1000 mg | ORAL_TABLET | Freq: Two times a day (BID) | ORAL | Status: DC
  Administered 2014-09-11 – 2014-09-12 (×3): 0.1 mg via ORAL
  Filled 2014-09-11 (×3): qty 1

## 2014-09-11 NOTE — Evaluation (Signed)
Physical Therapy Evaluation Patient Details Name: [name] MRN: [id number] DOB: 1946-01-20 Today's Date: 09/11/2014   History of Present Illness  69 y.o. male with a PMH of polysubstance abuse who was admitted on 09/09/14 with chief complaint of dizziness, blurred vision, presyncope/syncope as well as nausea/vomiting  Clinical Impression  Pt admitted with above diagnosis. Pt currently with functional limitations due to the deficits listed below (see PT Problem List).  Pt will benefit from skilled PT to increase their independence and safety with mobility to allow discharge to the venue listed below.  RN reports pt to go for hip xrays however wished PT to work with pt prior to this.  Pt reporting pain in upper anterior thighs (over quadriceps muscles) when asked to show location and denied hx of fall prior to admission so mobilized pt according to pain tolerance.  Pt reports pain improved somewhat with use of RW, no true antalgic type gait pattern observed however pt limited distance due to pain.  Pt reports he has family that can assist upon d/c.  Recommended pt use RW upon d/c for pain control.     Follow Up Recommendations Home health PT;Supervision for mobility/OOB    Equipment Recommendations  Rolling walker with 5" wheels    Recommendations for Other Services       Precautions / Restrictions Precautions Precautions: Fall      Mobility  Bed Mobility Overal bed mobility: Modified Independent                Transfers Overall transfer level: Needs assistance Equipment used: None Transfers: Sit to/from Stand Sit to Stand: Min guard         General transfer comment: pt with unsteadiness upon standing however able to self correct, used RW for ambulation so cues for taking RW back to bed upon return to sitting  Ambulation/Gait Ambulation/Gait assistance: Min guard Ambulation Distance (Feet): 25 Feet Assistive device: Rolling walker (2 wheeled) Gait  Pattern/deviations: Step-through pattern;Trunk flexed;Decreased stride length     General Gait Details: pt reports 7/10 bilateral thigh pain with standing however reports improved with use of RW, distance limited by fatigue and pain, no antalgic type gait observed, questioned pt about if having hip pain at all which he then replied yes however only originally stated thigh pain throughout session  Stairs            Wheelchair Mobility    Modified Rankin (Stroke Patients Only)       Balance Overall balance assessment:  (pt reports no fall prior to admission, per chart review pt with fall prior to admission)                                           Pertinent Vitals/Pain Pain Assessment: 0-10 Pain Score: 6  Pain Location: bilateral thigh pain - upper anterior Pain Descriptors / Indicators: Aching Pain Intervention(s): Limited activity within patient's tolerance;Monitored during session;Premedicated before session    Home Living Family/patient expects to be discharged to:: Private residence Living Arrangements: Other relatives (brother) Available Help at Discharge: Family   Home Access: Level entry     Home Layout: One level Home Equipment: None      Prior Function Level of Independence: Independent               Hand Dominance        Extremity/Trunk Assessment  Lower Extremity Assessment: RLE deficits/detail;LLE deficits/detail RLE Deficits / Details: grossly 4/5 pain throughout, reports anterior upper thigh pain with knee extension and hip abduction LLE Deficits / Details: grossly 4/5 pain throughout, reports anterior upper thigh pain with knee extension and hip abduction     Communication   Communication: No difficulties  Cognition Arousal/Alertness: Awake/alert Behavior During Therapy: WFL for tasks assessed/performed Overall Cognitive Status: Within Functional Limits for tasks assessed                       General Comments      Exercises        Assessment/Plan    PT Assessment Patient needs continued PT services  PT Diagnosis Difficulty walking;Acute pain   PT Problem List Decreased strength;Decreased mobility;Decreased activity tolerance;Decreased knowledge of use of DME  PT Treatment Interventions DME instruction;Gait training;Functional mobility training;Therapeutic activities;Therapeutic exercise;Patient/family education   PT Goals (Current goals can be found in the Care Plan section) Acute Rehab PT Goals PT Goal Formulation: With patient Time For Goal Achievement: 09/18/14 Potential to Achieve Goals: Good    Frequency Min 3X/week   Barriers to discharge        Co-evaluation               End of Session Equipment Utilized During Treatment: Gait belt Activity Tolerance: Patient limited by pain Patient left: in bed;with call bell/phone within reach;with bed alarm set Nurse Communication: Mobility status         Time: 1610-9604 PT Time Calculation (min) (ACUTE ONLY): 10 min   Charges:   PT Evaluation $Initial PT Evaluation Tier I: 1 Procedure     PT G Codes:        [name],[name] 09/11/2014, 2:33 PM [name], PT, DPT 09/11/2014 Pager: [phone]

## 2014-09-11 NOTE — Progress Notes (Signed)
Progress Note   [name] [id number] DOB: [birth year] DOA: 09/09/2014 PCP: No primary care provider on file.  Confirmed.  [facility] and [facility] Dr. Nils Pylesi-Bonsu.   Brief Narrative:   [name] is an 69 y.o. male with a PMH of polysubstance abuse who was admitted on 09/09/14 with chief complaint of dizziness, blurred vision, presyncope/syncope with fall after having a 2 day history of nausea/vomiting. Upon initial evaluation in the ED, the patient was hypotensive with acute renal failure.  Assessment/Plan:   Principal Problem:   Syncope / orthostatic hypotension - Likely from orthostatic hypotension given his 2 day history of nausea/vomiting. - Continue to hydrate. - Continue to monitor on telemetry. - Physical therapy evaluation. - CT of the head negative for acute findings.  Active Problems:   Cerebrovascular disease - Remote thalamic and basal ganglia stroke noted on CT of the head. - Needs risk factor modification. - Check FLP. Blood pressure controlled. - Consider adding low-dose aspirin after physical therapy evaluation if not a high fall risk.    Hypertension  - Controlled on Norvasc.     Leg pain - CK checked. No evidence of rhabdomyolysis. Analgesics and supportive care with a heating pad/topical muscle rubs. - Check hip films.    Benign prostatic hypertrophy (BPH) with incomplete bladder emptying - Continue Vesicare.    AKI (acute kidney injury) - Likely prerenal in etiology given history of nausea/vomiting. - Hydrate and monitor. If no improvement with hydration, obtain renal ultrasound.    Nausea and vomiting - Supportive care with IV fluids and antinausea medicines.    Polysubstance abuse - Counseled. IV narcotics discontinued. Avoid further IV narcotics. - Has been noted to be pain medication seeking. - Currently on Ativan detox protocol. - Start clonidine for opiate withdrawal.   - Snorts powder heroin weekly, denies  IVDA.    DVT prophylaxis - Heparin ordered.  Code Status: Full. Family Communication: No family currently at the bedside. Santa LighterWalter Boven (brother) is his emergency contact. Declines my offer to call. Disposition Plan: Home when stable likely 24-48 hours if syncope does not return, no arrhythmic events noted on telemetry and pending physical therapy evaluation.   IV Access:    Peripheral IV   Procedures and diagnostic studies:   Ct Head Wo Contrast 09/09/2014: No acute intracranial process.  Involutional changes. Remote RIGHT thalamus, LEFT basal ganglia infarcts. Moderate white matter changes suggest chronic small vessel ischemic disease.     Medical Consultants:    None.  Anti-Infectives:    None.  Subjective:   [name] reports bilateral leg pain for the last 3-4 days, describes it as "excrutiating", which he says is a dull ache and has not been helped by increasing his oxycodone.  Denies diarrhea but had an episode of vomiting.  No appetite today, but ate good yesterday.  Requesting additional pain medications.   Objective:    Filed Vitals:   09/10/14 0507 09/10/14 1406 09/10/14 2236 09/11/14 0449  BP: 104/57   152/88  Pulse: 71   91  Temp: 98.2 F (36.8 C) 98.2 F (36.8 C) 97.8 F (36.6 C) 99.1 F (37.3 C)  TempSrc: Oral Oral Oral Oral  Resp: 14     Height:      Weight:      SpO2: 100%  100% 100%    Intake/Output Summary (Last 24 hours) at 09/11/14 0825 Last data filed at 09/11/14 0639  Gross per 24 hour  Intake  720 ml  Output   2725 ml  Net  -2005 ml    Exam: Gen:  NAD Cardiovascular:  RRR, No M/R/G Respiratory:  Lungs CTAB Gastrointestinal:  Abdomen soft, NT/ND, + BS Extremities:  No C/E/C, no muscular atrophy, no synovitis of knees, pain with palpation of hips.   Data Reviewed:    Labs: Basic Metabolic Panel:  Recent Labs Lab 09/07/14 1917 09/09/14 2052 09/10/14 0332  NA 140 140 137  K 3.6 4.5 3.6  CL 101 102 108    CO2  --  25 21  GLUCOSE 140* 116* 107*  BUN 7 45* 36*  CREATININE 0.60 3.91* 2.34*  CALCIUM  --  9.6 8.4   GFR Estimated Creatinine Clearance: 28.9 mL/min (by C-G formula based on Cr of 2.34). Liver Function Tests:  Recent Labs Lab 09/07/14 1907  AST 29  ALT 26  ALKPHOS 116  BILITOT 0.5  PROT 9.7*  ALBUMIN 4.4    Recent Labs Lab 09/07/14 1907  LIPASE 18   CBC:  Recent Labs Lab 09/07/14 1907 09/07/14 1917 09/09/14 2052 09/10/14 0332  WBC 10.7*  --  13.8* 8.9  NEUTROABS 9.5*  --   --   --   HGB 15.4 18.0* 14.2 11.7*  HCT 47.2 53.0* 44.3 37.3*  MCV 89.1  --  90.6 90.1  PLT 360  --  389 306   CBG:  Recent Labs Lab 09/09/14 2025  GLUCAP 117*   Microbiology No results found for this or any previous visit (from the past 240 hour(s)).   Medications:   . amLODipine  10 mg Oral Daily  . feeding supplement (ENSURE ENLIVE)  237 mL Oral BID BM  . fluconazole  200 mg Oral Daily  . folic acid  1 mg Oral Daily  . heparin  5,000 Units Subcutaneous 3 times per day  . LORazepam  0-4 mg Oral Q6H   Followed by  . [START ON 09/12/2014] LORazepam  0-4 mg Oral Q12H  . multivitamin with minerals  1 tablet Oral Daily  . nicotine  7 mg Transdermal Daily  . pantoprazole  40 mg Oral Daily  . pneumococcal 23 valent vaccine  0.5 mL Intramuscular Tomorrow-1000  . sodium chloride  3 mL Intravenous Q12H  . tamsulosin  0.4 mg Oral QHS  . thiamine  100 mg Oral Daily   Or  . thiamine  100 mg Intravenous Daily  . traZODone  150 mg Oral QHS   Continuous Infusions: . sodium chloride 125 mL/hr at 09/11/14 0710    Time spent: 25 minutes.    LOS: 2 days   Din Bookwalter  [facility] Pager [phone]. If unable to reach me by pager, please call my cell phone at [phone].  *Please refer to [email or web address], password [email or web address] to get updated schedule on who will round on this patient, as hospitalists switch teams weekly. If 7PM-7AM, please contact night-coverage at [email or web address],  password [email or web address] for any overnight needs.  09/11/2014, 8:25 AM

## 2014-09-11 NOTE — Progress Notes (Signed)
PT Cancellation Note  Patient Details Name: [name] MRN: [id number] DOB: 1945/11/28   Cancelled Treatment:    Reason Eval/Treat Not Completed: Other (comment) (pt reports nausea and vomiting at this time, prefers PT to check back)   [name],KATHrine E 09/11/2014, 10:37 AM Zenovia JarredKati Dalma Panchal, PT, DPT 09/11/2014 Pager: [phone]

## 2014-09-12 MED ORDER — OXYCODONE HCL 5 MG PO TABS: 5.0000 mg | ORAL_TABLET | ORAL | Status: DC | PRN

## 2014-09-12 MED ORDER — ENSURE ENLIVE PO LIQD: 237.0000 mL | Freq: Two times a day (BID) | ORAL | Status: DC

## 2014-09-12 MED ORDER — ADULT MULTIVITAMIN W/MINERALS CH: 1.0000 | ORAL_TABLET | Freq: Every day | ORAL | Status: AC

## 2014-09-12 MED ORDER — THIAMINE HCL 100 MG PO TABS: 100.0000 mg | ORAL_TABLET | Freq: Every day | ORAL | Status: DC

## 2014-09-12 MED ORDER — CLONIDINE HCL 0.1 MG PO TABS: 0.1000 mg | ORAL_TABLET | Freq: Two times a day (BID) | ORAL | Status: DC

## 2014-09-12 MED ORDER — FOLIC ACID 1 MG PO TABS: 1.0000 mg | ORAL_TABLET | Freq: Every day | ORAL | Status: DC

## 2014-09-12 MED ORDER — ACETAMINOPHEN 325 MG PO TABS: 650.0000 mg | ORAL_TABLET | Freq: Four times a day (QID) | ORAL | Status: AC | PRN

## 2014-09-12 NOTE — Discharge Summary (Signed)
Physician Discharge Summary  [name] ZOX:[id number] DOB: 1945/06/13 DOA: 09/09/2014  PCP: No primary care provider on file.  Admit date: 09/09/2014 Discharge date: 09/12/2014   Recommendations for Outpatient Follow-Up:   1. The [facility]. Appointment scheduled for 09/16/14. 2. He has a history of heroin abuse, referred to outpatient substance abuse counseling.   Discharge Diagnosis:   Principal Problem:    Syncope Active Problems:    Benign prostatic hypertrophy (BPH) with incomplete bladder emptying    AKI (acute kidney injury)    Orthostatic hypotension    Nausea and vomiting    Polysubstance abuse   Discharge disposition:  Home with home physical therapy services.    Discharge Condition: Improved.  Diet recommendation: Low sodium, heart healthy.    Wound care: None.   History of Present Illness:   [name] is an 69 y.o. male with a PMH of polysubstance abuse who was admitted on 09/09/14 with chief complaint of dizziness, blurred vision, presyncope/syncope with fall after having a 2 day history of nausea/vomiting. Upon initial evaluation in the ED, the patient was hypotensive with acute renal failure.  Hospital Course by Problem:   Principal Problem:  Syncope / orthostatic hypotension - Likely from orthostatic hypotension given his 2 day history of nausea/vomiting. - Hydrated with improvement of symptoms and no further presyncope. - No arrhythmias noted on telemetry. - Physical therapy evaluation performed, home physical therapy set up. - CT of the head negative for acute findings.  Active Problems:  Cerebrovascular disease - Remote thalamic and basal ganglia stroke noted on CT of the head. - Needs risk factor modification. - LDL 66, total cholesterol 811. Blood pressure controlled.   Hypertension  - Controlled on Norvasc.    Leg pain - CK checked. No evidence  of rhabdomyolysis. Analgesics and supportive care with a heating pad/topical muscle rubs. - Hip films only showed mild osteoarthritis.   Benign prostatic hypertrophy (BPH) with incomplete bladder emptying - Continue Vesicare.   AKI (acute kidney injury) - Likely prerenal in etiology given history of nausea/vomiting. - Creatinine normalized with hydration.   Nausea and vomiting - Supportive care with IV fluids and antinausea medicines.   Polysubstance abuse - Counseled. IV narcotics discontinued. Avoid further IV narcotics. - Has been noted to be pain medication seeking. - Completed Ativan detox protocol. - Continue clonidine for opiate withdrawal.  - Snorts powder heroin weekly, denies IVDA. - Child psychotherapist provided substance abuse resource/treatment options prior to discharge.   Medical Consultants:    None.   Discharge Exam:   Filed Vitals:   09/12/14 0647  BP: 140/75  Pulse: 88  Temp: 98.1 F (36.7 C)  Resp: 16   Filed Vitals:   09/11/14 0449 09/11/14 1418 09/11/14 2104 09/12/14 0647  BP: 152/88 159/88 153/86 140/75  Pulse: 91 90 102 88  Temp: 99.1 F (37.3 C) 98.3 F (36.8 C) 99.2 F (37.3 C) 98.1 F (36.7 C)  TempSrc: Oral Oral Oral Oral  Resp:   15 16  Height:      Weight:      SpO2: 100% 100% 97% 100%    Gen:  NAD Cardiovascular:  RRR, No M/R/G Respiratory: Lungs CTAB Gastrointestinal: Abdomen soft, NT/ND with normal active bowel sounds. Extremities: No C/E/C   The results of significant diagnostics from this hospitalization (including imaging, microbiology, ancillary and laboratory) are listed below for reference.     Procedures and Diagnostic Studies:  Ct Head Wo Contrast 09/09/2014: No acute intracranial process. Involutional changes. Remote RIGHT thalamus, LEFT basal ganglia infarcts. Moderate white matter changes suggest chronic small vessel ischemic disease.   Labs:   Basic Metabolic Panel:  Recent Labs Lab 09/07/14 1917  09/09/14 2052 09/10/14 0332 09/11/14 0838  NA 140 140 137 139  K 3.6 4.5 3.6 3.7  CL 101 102 108 107  CO2  --  25 21 25   GLUCOSE 140* 116* 107* 127*  BUN 7 45* 36* 14  CREATININE 0.60 3.91* 2.34* 0.83  CALCIUM  --  9.6 8.4 9.0   GFR Estimated Creatinine Clearance: 81.4 mL/min (by C-G formula based on Cr of 0.83). Liver Function Tests:  Recent Labs Lab 09/07/14 1907 09/11/14 0838  AST 29 22  ALT 26 19  ALKPHOS 116 77  BILITOT 0.5 0.5  PROT 9.7* 7.5  ALBUMIN 4.4 3.4*    Recent Labs Lab 09/07/14 1907  LIPASE 18   CBC:  Recent Labs Lab 09/07/14 1907 09/07/14 1917 09/09/14 2052 09/10/14 0332  WBC 10.7*  --  13.8* 8.9  NEUTROABS 9.5*  --   --   --   HGB 15.4 18.0* 14.2 11.7*  HCT 47.2 53.0* 44.3 37.3*  MCV 89.1  --  90.6 90.1  PLT 360  --  389 306   Cardiac Enzymes:  Recent Labs Lab 09/10/14 1526  CKTOTAL 177   CBG:  Recent Labs Lab 09/09/14 2025  GLUCAP 117*   Lipid Profile  Recent Labs  09/11/14 0838  CHOL 122  HDL 34*  LDLCALC 66  TRIG 454108  CHOLHDL 3.6    Discharge Instructions:   Discharge Instructions    Call MD for:  extreme fatigue    Complete by:  As directed      Call MD for:  persistant dizziness or light-headedness    Complete by:  As directed      Call MD for:  persistant nausea and vomiting    Complete by:  As directed      Call MD for:  severe uncontrolled pain    Complete by:  As directed      Diet - low sodium heart healthy    Complete by:  As directed      Discharge instructions    Complete by:  As directed   You were cared for by Dr. Hillery Aldohristina Rama  (a hospitalist) during your hospital stay. If you have any questions about your discharge medications or the care you received while you were in the hospital after you are discharged, you can call the unit and ask to speak with the hospitalist on call if the hospitalist that took care of you is not available. Once you are discharged, your primary care physician will  handle any further medical issues. Please note that NO REFILLS for any discharge medications will be authorized once you are discharged, as it is imperative that you return to your primary care physician (or establish a relationship with a primary care physician if you do not have one) for your aftercare needs so that they can reassess your need for medications and monitor your lab values.  Any outstanding tests can be reviewed by your PCP at your follow up visit.  It is also important to review any medicine changes with your PCP.  Please bring these d/c instructions with you to your next visit so your physician can review these changes with you.  If you do not have a primary care physician, you  can call [phone] for a physician referral.  It is highly recommended that you obtain a PCP for hospital follow up.  It is important that you are under the care of a doctor to follow up on your blood pressure and monitor your kidney function.     Face-to-face encounter (required for Medicare/Medicaid patients)    Complete by:  As directed   I RAMA,CHRISTINA certify that this patient is under my care and that I, or a nurse practitioner or physician's assistant working with me, had a face-to-face encounter that meets the physician face-to-face encounter requirements with this patient on 09/12/2014. The encounter with the patient was in whole, or in part for the following medical condition(s) which is the primary reason for home health care (List medical condition): Here w/ weakness from ARF, heroin abuse, leg pain.  Needs PT.  The encounter with the patient was in whole, or in part, for the following medical condition, which is the primary reason for home health care:  Weakness, acute renal failure  I certify that, based on my findings, the following services are medically necessary home health services:  Physical therapy  Reason for Medically Necessary Home Health Services:  Therapy- Therapeutic Exercises to Increase  Strength and Endurance  My clinical findings support the need for the above services:  Pain interferes with ambulation/mobility  Further, I certify that my clinical findings support that this patient is homebound due to:  Pain interferes with ambulation/mobility     Home Health    Complete by:  As directed   To provide the following care/treatments:  PT     Increase activity slowly    Complete by:  As directed             Medication List    STOP taking these medications        ciprofloxacin 500 MG tablet  Commonly known as:  CIPRO     fluconazole 200 MG tablet  Commonly known as:  DIFLUCAN     oxyCODONE-acetaminophen 10-325 MG per tablet  Commonly known as:  PERCOCET     phenazopyridine 200 MG tablet  Commonly known as:  PYRIDIUM     ranitidine 150 MG capsule  Commonly known as:  ZANTAC      TAKE these medications        acetaminophen 325 MG tablet  Commonly known as:  TYLENOL  Take 2 tablets (650 mg total) by mouth every 6 (six) hours as needed for mild pain (or Fever >/= 101).     amLODipine-benazepril 10-40 MG per capsule  Commonly known as:  LOTREL  Take 1 capsule by mouth every morning.     cloNIDine 0.1 MG tablet  Commonly known as:  CATAPRES  Take 1 tablet (0.1 mg total) by mouth 2 (two) times daily.     feeding supplement (ENSURE ENLIVE) Liqd  Take 237 mLs by mouth 2 (two) times daily between meals.     folic acid 1 MG tablet  Commonly known as:  FOLVITE  Take 1 tablet (1 mg total) by mouth daily.     multivitamin with minerals Tabs tablet  Take 1 tablet by mouth daily.     omeprazole 40 MG capsule  Commonly known as:  PRILOSEC  Take 1 capsule (40 mg total) by mouth every morning.     oxyCODONE 5 MG immediate release tablet  Commonly known as:  Oxy IR/ROXICODONE  Take 1-2 tablets (5-10 mg total) by mouth every 4 (four) hours as needed  for moderate pain.     pravastatin 40 MG tablet  Commonly known as:  PRAVACHOL  Take 40 mg by mouth at  bedtime.     tamsulosin 0.4 MG Caps capsule  Commonly known as:  FLOMAX  Take 1 capsule by mouth at bedtime.     thiamine 100 MG tablet  Take 1 tablet (100 mg total) by mouth daily.     traZODone 150 MG tablet  Commonly known as:  DESYREL  Take 150 mg by mouth at bedtime.           Follow-up Information    Follow up with [facility]    . Go on 09/16/2014.   Why:  Appointment time is 10:30 am . Appointment for ; Acute Renal Failure and BP.   Contact information:   [address] [place] [address] [phone]      Follow up with [facility].   Why:  HHPT   Contact information:   [address] [place] [address] [phone]        Time coordinating discharge: 35 minutes.  Signed:  RAMA,CHRISTINA  Pager [phone] [facility] 09/12/2014, 3:42 PM

## 2014-09-12 NOTE — Clinical Documentation Improvement (Signed)
Presents with Syncope, Opioid Dependent with Withdrawal, ARF.  Please clarify the likely etiology of the patient's syncope and document findings in next progress note and include in discharge summary.   Orthostatic Hypotension as documented  Nausea and Vomiting - please provide more acute condition  ARF as documented  Other Condition  Cannot Clinically Determine  Thank You, Shellee MiloEileen T Raissa Dam ,RN Clinical Documentation Specialist:  [phone]  [facility]

## 2014-09-12 NOTE — Clinical Documentation Improvement (Signed)
Presents with Syncope, ARF, Opioid Dependence with Withdrawal.   Seen by Nutrition who feels patient has moderate malnutrition  31 lb weight loss in 6 months  BMI of 19  Ensure Enlive po BID ordered  Diagnosis not seen on Problem List  Please assess consult dated 09/10/14 and render your opinion in next progress note and include in discharge summary if applicable.  Other Condition Cannot clinically determine  Thank You, Shellee MiloEileen T Shirle Provencal ,RN Clinical Documentation Specialist:  [phone]  [facility]

## 2014-09-12 NOTE — Clinical Documentation Improvement (Signed)
Presents with Syncope; ARF is documented.   Creatinines for this admission are: 3.91 and 2.34  Please provide the patient's baseline Creatinine if known to capture the diagnosis and document findings in next progress note and include in discharge summary.  Thank You, Shellee MiloEileen T Atanacio Melnyk ,RN Clinical Documentation Specialist:  [phone]  [facility]- [facility] Information [facility]

## 2014-09-12 NOTE — Care Management Note (Signed)
    Page 1 of 1   09/12/2014     2:03:33 PM CARE MANAGEMENT NOTE 09/12/2014  Patient:  Gala LewandowskyJOHNSON,Isaia   Account Number:  [id number]  Date Initiated:  09/10/2014  Documentation initiated by:  Lanier ClamMAHABIR,Elwyn Klosinski  Subjective/Objective Assessment:   69 y/o admitted w/AKI     Action/Plan:   From home.   Anticipated DC Date:  09/12/2014   Anticipated DC Plan:  HOME W HOME HEALTH SERVICES      DC Planning Services  CM consult      Choice offered to / List presented to:  C-1 Patient        HH arranged  HH-2 PT      [facility]  [facility]   Status of service:  Completed, signed off Medicare Important Message given?  YES (If response is "NO", the following Medicare IM given date fields will be blank) Date Medicare IM given:  09/12/2014 Medicare IM given by:  [facility]Freddye Cardamone Date Additional Medicare IM given:   Additional Medicare IM given by:    Discharge Disposition:  HOME W HOME HEALTH SERVICES  Per UR Regulation:  Reviewed for med. necessity/level of care/duration of stay  If discussed at Long Length of Stay Meetings, dates discussed:    Comments:  09/12/14 Lanier ClamKathy Antionette Luster RN BSN NCM [phone] [facility]rep [place] aware.CSW provided otpt rehab detox resources.

## 2014-09-16 ENCOUNTER — Inpatient Hospital Stay: Payer: Medicare Other | Admitting: Family Medicine

## 2014-09-17 ENCOUNTER — Ambulatory Visit: Payer: Medicare Other | Attending: Family Medicine | Admitting: Family Medicine

## 2014-09-17 ENCOUNTER — Encounter: Payer: Self-pay | Admitting: Family Medicine

## 2014-09-17 VITALS — BP 103/65 | HR 87 | Temp 98.1°F | Resp 18 | Ht 72.0 in | Wt 146.0 lb

## 2014-09-17 DIAGNOSIS — M25551 Pain in right hip: Secondary | ICD-10-CM | POA: Diagnosis not present

## 2014-09-17 DIAGNOSIS — M25552 Pain in left hip: Secondary | ICD-10-CM

## 2014-09-17 DIAGNOSIS — N179 Acute kidney failure, unspecified: Secondary | ICD-10-CM

## 2014-09-17 DIAGNOSIS — F1910 Other psychoactive substance abuse, uncomplicated: Secondary | ICD-10-CM | POA: Diagnosis not present

## 2014-09-17 LAB — BASIC METABOLIC PANEL
BUN: 15 mg/dL (ref 6–23)
CHLORIDE: 102 meq/L (ref 96–112)
CO2: 27 meq/L (ref 19–32)
Calcium: 9.4 mg/dL (ref 8.4–10.5)
Creat: 0.65 mg/dL (ref 0.50–1.35)
Glucose, Bld: 98 mg/dL (ref 70–99)
Potassium: 5.3 mEq/L (ref 3.5–5.3)
SODIUM: 138 meq/L (ref 135–145)

## 2014-09-17 MED ORDER — TRAMADOL HCL 50 MG PO TABS: 50.0000 mg | ORAL_TABLET | Freq: Three times a day (TID) | ORAL | Status: DC | PRN

## 2014-09-17 MED ORDER — KETOROLAC TROMETHAMINE 60 MG/2ML IM SOLN
60.0000 mg | Freq: Once | INTRAMUSCULAR | Status: AC
Start: 2014-09-17 — End: 2014-09-17
  Administered 2014-09-17: 60 mg via INTRAMUSCULAR

## 2014-09-17 NOTE — Progress Notes (Signed)
Subjective:    Patient ID: [name], male    DOB: [birth year], 69 y.o.   MRN: [id number]  HPI  Admit Date: 09/09/14 Discharge Date: 09/12/14  nilo fallin is a 69 year old male with a history of polysubstance abuse who had presented with nausea, vomiting, dizziness, presyncope and was found to be hypertensive on presentation with a blood pressure of 88/58 and in acute renal failure with a creatinine of 3.91.  He was placed on IV hydration; CT head performed revealed no acute intracranial process, remote right thalamus, left basal ganglia infarcts and small vessel ischemic disease. Bilateral hip and pelvic x-ray done for bilateral hip pain  revealed no acute abnormality with mild degenerative changes. He received IV narcotics but then was noted to be pain medication seeking and was placed on Ativan detox protocol and clonidine for opiate withdrawal but was discharged on oxycodone.  Interval History: His vomiting and nausea have resolved however he complains of bilateral hip pain which he has had for 2 weeks and is worse on rising from a seated position; pain is 9 out of 10, wakes him up from sleep and is unresolved with Tylenol. Denies fevers, night sweats or chills.  Of note he uses heroine and last used it a week ago.  Past Medical History  Diagnosis Date  . Hypertension   . GERD (gastroesophageal reflux disease)   . Arthritis   . Peptic ulcer disease     Past Surgical History  Procedure Laterality Date  . Lung surgery  2008     right   . Transurethral resection of prostate N/A 03/17/2014    Procedure: TRANSURETHRAL RESECTION OF THE PROSTATE (TURP) WITH GYRUS;  Surgeon: Garnett Farm, MD;  Location: [facility];  Service: Urology;  Laterality: N/A;   History   Social History  . Marital Status: Divorced    Spouse Name: N/A  . Number of Children: N/A  . Years of Education: N/A   Occupational History  . Not on file.   Social History Main Topics  . Smoking status:  Current Every Day Smoker -- 0.25 packs/day for 35 years    Types: Cigarettes  . Smokeless tobacco: Never Used  . Alcohol Use: No     Comment: hx of etoh use- 5 years ago   . Drug Use: Yes    Special: Heroin     Comment: hx of last use, 1 week ago  . Sexual Activity: No   Other Topics Concern  . Not on file   Social History Narrative     No Known Allergies  Current Outpatient Prescriptions on File Prior to Visit  Medication Sig Dispense Refill  . acetaminophen (TYLENOL) 325 MG tablet Take 2 tablets (650 mg total) by mouth every 6 (six) hours as needed for mild pain (or Fever >/= 101).    Marland Kitchen amLODipine-benazepril (LOTREL) 10-40 MG per capsule Take 1 capsule by mouth every morning.    . cloNIDine (CATAPRES) 0.1 MG tablet Take 1 tablet (0.1 mg total) by mouth 2 (two) times daily. 60 tablet 11  . feeding supplement, ENSURE ENLIVE, (ENSURE ENLIVE) LIQD Take 237 mLs by mouth 2 (two) times daily between meals. 237 mL 12  . folic acid (FOLVITE) 1 MG tablet Take 1 tablet (1 mg total) by mouth daily. 30 tablet 0  . Multiple Vitamin (MULTIVITAMIN WITH MINERALS) TABS tablet Take 1 tablet by mouth daily.    Marland Kitchen omeprazole (PRILOSEC) 40 MG capsule Take 1 capsule (40 mg total) by mouth  every morning. 30 capsule 0  . pravastatin (PRAVACHOL) 40 MG tablet Take 40 mg by mouth at bedtime.    . tamsulosin (FLOMAX) 0.4 MG CAPS capsule Take 1 capsule by mouth at bedtime.  11  . thiamine 100 MG tablet Take 1 tablet (100 mg total) by mouth daily. 30 tablet 2  . traZODone (DESYREL) 150 MG tablet Take 150 mg by mouth at bedtime.     Marland Kitchen. oxyCODONE (OXY IR/ROXICODONE) 5 MG immediate release tablet Take 1-2 tablets (5-10 mg total) by mouth every 4 (four) hours as needed for moderate pain. (Patient not taking: Reported on 09/17/2014) 15 tablet 0   No current facility-administered medications on file prior to visit.    Review of Systems  General: negative for fever, weight loss, appetite change Eyes: no visual  symptoms. ENT: no ear symptoms, no sinus tenderness, no nasal congestion or sore throat. Neck: no pain  Respiratory: no wheezing, shortness of breath, cough Cardiovascular: no chest pain, no dyspnea on exertion, no pedal edema, no orthopnea. Gastrointestinal: no abdominal pain, no diarrhea, no constipation Genito-Urinary: no urinary frequency, no dysuria, no polyuria. Hematologic: no bruising Endocrine: no cold or heat intolerance Neurological: no headaches, no seizures, no tremors Musculoskeletal: see HPI Skin: no pruritus, no rash. Psychological: no depression, no anxiety,       Objective: Filed Vitals:   09/17/14 1158  BP: 103/65  Pulse: 87  Temp: 98.1 F (36.7 C)  Resp: 18      Physical Exam  Constitutional: normal appearing,  Eyes: PERRLA HEENT: Head is atraumatic, normal sinuses, normal oropharynx, normal appearing tonsils and palate, tympanic membrane is normal bilaterally. Neck: normal range of motion, no thyromegaly, no JVD Cardiovascular: normal rate and rhythm, normal heart sounds, no murmurs, rub or gallop, no pedal edema Respiratory: clear to auscultation bilaterally, no wheezes, no rales, no rhonchi Abdomen: soft, not tender to palpation, normal bowel sounds, no enlarged organs Extremities: point tenderness in hips bilaterally, significantly reduced ROM Skin: warm and dry, no lesions. Neurological: alert, oriented x3, cranial nerves I-XII grossly intact , normal motor strength, normal sensation. Psychological: normal mood.         Assessment & Plan:  69 year old male with a history of hypertension recently hospitalized for prerenal acute kidney injury secondary to dehydration from nausea and vomiting which have now ceased but the patient has bilateral hip pain.  Acute kidney injury: Resolved evidenced by a drop in creatinine from 3.91 to 0.83 Patient to avoid nephrotoxins and I will send off a basic metabolic panel today to evaluate.  Bilateral hip  pain: Suggestive of osteoarthritis as evidenced by his hip x-ray which reveals degenerative changes bilaterally. Due to his history of substance abuse I am reluctant to give him controlled medications. I am giving him Toradol 60 mg 1 Dose and I will place. him on short course of tramadol. If symptoms persist he will need pain management referral.  Substance abuse: Educated on sequelae of above and social worker called in to discuss outpatient resources for rehabilitation.  Hypertension:  Controlled

## 2014-09-17 NOTE — Patient Instructions (Signed)

## 2014-09-17 NOTE — Progress Notes (Signed)
Patient hospitalized Tues-Fri, patient was dehydrated, had been vomiting. Pain has pain in both hips, at level 9, described as sore, "won't go away, can't hardly touch it", started 2 weeks ago. Patient reports no falls, patient woke up in the middle of the night with hip pain, pain has gotten worse. Patient has to get up slowly and wait a few seconds before he can walk. Patient is ready to quit smoking.

## 2014-09-19 ENCOUNTER — Other Ambulatory Visit: Payer: Self-pay | Admitting: Family Medicine

## 2014-09-19 DIAGNOSIS — M25551 Pain in right hip: Secondary | ICD-10-CM

## 2014-09-19 DIAGNOSIS — M25552 Pain in left hip: Principal | ICD-10-CM

## 2014-09-22 ENCOUNTER — Telehealth: Payer: Self-pay | Admitting: Clinical

## 2014-09-22 ENCOUNTER — Telehealth: Payer: Self-pay

## 2014-09-22 NOTE — Telephone Encounter (Signed)
Patient returned call to nurse. Patient verified date of birth. Patient aware of normal labs. Patient voiced understanding.

## 2014-09-22 NOTE — Telephone Encounter (Signed)
-----   Message from Enobong Amao, MD sent at 09/19/2014  8:20 AM EDT ----- Please inform the patient that labs are normal. Thank you. 

## 2014-09-22 NOTE — Telephone Encounter (Signed)
Patient called is returning nurse's phone call to review results. Please f/u with patient  °

## 2014-09-22 NOTE — Telephone Encounter (Signed)
No message

## 2014-09-22 NOTE — Telephone Encounter (Signed)
Voicemail not set up, did not leave message(see above)

## 2014-09-22 NOTE — Telephone Encounter (Signed)
Nurse called patient. Reached voicemail stating "I'm sorry, but the person you are trying to reach does not have a voicemail set up".

## 2014-09-22 NOTE — Telephone Encounter (Signed)
-----   Message from Jaclyn ShaggyEnobong Amao, MD sent at 09/19/2014  8:20 AM EDT ----- Please inform the patient that labs are normal. Thank you.

## 2014-10-09 ENCOUNTER — Ambulatory Visit: Payer: Medicare Other | Admitting: Family Medicine

## 2015-11-05 ENCOUNTER — Encounter (HOSPITAL_COMMUNITY): Payer: Self-pay

## 2015-11-05 ENCOUNTER — Emergency Department (HOSPITAL_COMMUNITY)
Admission: EM | Admit: 2015-11-05 | Discharge: 2015-11-06 | Disposition: A | Discharge status: Home / Self Care | Payer: Medicare Other | Attending: Emergency Medicine | Admitting: Emergency Medicine

## 2015-11-05 ENCOUNTER — Emergency Department (HOSPITAL_COMMUNITY): Payer: Medicare Other

## 2015-11-05 DIAGNOSIS — F1190 Opioid use, unspecified, uncomplicated: Secondary | ICD-10-CM

## 2015-11-05 DIAGNOSIS — Y92410 Unspecified street and highway as the place of occurrence of the external cause: Secondary | ICD-10-CM | POA: Insufficient documentation

## 2015-11-05 DIAGNOSIS — R55 Syncope and collapse: Secondary | ICD-10-CM | POA: Diagnosis not present

## 2015-11-05 DIAGNOSIS — Y9389 Activity, other specified: Secondary | ICD-10-CM | POA: Diagnosis not present

## 2015-11-05 DIAGNOSIS — Y999 Unspecified external cause status: Secondary | ICD-10-CM | POA: Insufficient documentation

## 2015-11-05 DIAGNOSIS — F17210 Nicotine dependence, cigarettes, uncomplicated: Secondary | ICD-10-CM | POA: Diagnosis not present

## 2015-11-05 DIAGNOSIS — M1990 Unspecified osteoarthritis, unspecified site: Secondary | ICD-10-CM | POA: Diagnosis not present

## 2015-11-05 DIAGNOSIS — R32 Unspecified urinary incontinence: Secondary | ICD-10-CM | POA: Diagnosis not present

## 2015-11-05 DIAGNOSIS — I10 Essential (primary) hypertension: Secondary | ICD-10-CM | POA: Diagnosis not present

## 2015-11-05 DIAGNOSIS — R29898 Other symptoms and signs involving the musculoskeletal system: Secondary | ICD-10-CM

## 2015-11-05 DIAGNOSIS — F1120 Opioid dependence, uncomplicated: Secondary | ICD-10-CM | POA: Diagnosis not present

## 2015-11-05 DIAGNOSIS — Z79899 Other long term (current) drug therapy: Secondary | ICD-10-CM | POA: Diagnosis not present

## 2015-11-05 LAB — CBC WITH DIFFERENTIAL/PLATELET
BASOS ABS: 0 10*3/uL (ref 0.0–0.1)
Basophils Relative: 0 %
EOS PCT: 1 %
Eosinophils Absolute: 0.1 10*3/uL (ref 0.0–0.7)
HEMATOCRIT: 43.6 % (ref 39.0–52.0)
HEMOGLOBIN: 13.9 g/dL (ref 13.0–17.0)
LYMPHS ABS: 1.6 10*3/uL (ref 0.7–4.0)
LYMPHS PCT: 14 %
MCH: 28.8 pg (ref 26.0–34.0)
MCHC: 31.9 g/dL (ref 30.0–36.0)
MCV: 90.5 fL (ref 78.0–100.0)
Monocytes Absolute: 1 10*3/uL (ref 0.1–1.0)
Monocytes Relative: 9 %
NEUTROS ABS: 8.5 10*3/uL — AB (ref 1.7–7.7)
NEUTROS PCT: 76 %
PLATELETS: 306 10*3/uL (ref 150–400)
RBC: 4.82 MIL/uL (ref 4.22–5.81)
RDW: 13.1 % (ref 11.5–15.5)
WBC: 11.1 10*3/uL — AB (ref 4.0–10.5)

## 2015-11-05 LAB — I-STAT TROPONIN, ED: Troponin i, poc: 0 ng/mL (ref 0.00–0.08)

## 2015-11-05 LAB — URINALYSIS, ROUTINE W REFLEX MICROSCOPIC
Glucose, UA: NEGATIVE mg/dL
Hgb urine dipstick: NEGATIVE
Ketones, ur: NEGATIVE mg/dL
NITRITE: NEGATIVE
PH: 5.5 (ref 5.0–8.0)
Protein, ur: 30 mg/dL — AB
SPECIFIC GRAVITY, URINE: 1.039 — AB (ref 1.005–1.030)

## 2015-11-05 LAB — CBG MONITORING, ED
GLUCOSE-CAPILLARY: 104 mg/dL — AB (ref 65–99)
GLUCOSE-CAPILLARY: 103 mg/dL — AB (ref 65–99)

## 2015-11-05 LAB — URINE MICROSCOPIC-ADD ON

## 2015-11-05 LAB — RAPID URINE DRUG SCREEN, HOSP PERFORMED
Amphetamines: POSITIVE — AB
BARBITURATES: NOT DETECTED
BENZODIAZEPINES: POSITIVE — AB
COCAINE: POSITIVE — AB
OPIATES: POSITIVE — AB
Tetrahydrocannabinol: NOT DETECTED

## 2015-11-05 NOTE — ED Notes (Signed)
Patient transported to CT 
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
## 2015-11-05 NOTE — ED Notes (Signed)
Pt is A&Ox4 
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
## 2015-11-06 ENCOUNTER — Emergency Department (HOSPITAL_COMMUNITY): Payer: Medicare Other

## 2015-11-06 DIAGNOSIS — R55 Syncope and collapse: Secondary | ICD-10-CM | POA: Diagnosis not present

## 2015-11-06 LAB — COMPREHENSIVE METABOLIC PANEL
ALK PHOS: 93 U/L (ref 38–126)
ALT: 22 U/L (ref 17–63)
ANION GAP: 9 (ref 5–15)
AST: 28 U/L (ref 15–41)
Albumin: 3.8 g/dL (ref 3.5–5.0)
BILIRUBIN TOTAL: 1 mg/dL (ref 0.3–1.2)
BUN: 15 mg/dL (ref 6–20)
CALCIUM: 9.5 mg/dL (ref 8.9–10.3)
CO2: 23 mmol/L (ref 22–32)
CREATININE: 0.87 mg/dL (ref 0.61–1.24)
Chloride: 108 mmol/L (ref 101–111)
GFR calc Af Amer: >60 mL/min (ref >60)
GFR calc non Af Amer: >60 mL/min (ref >60)
Glucose, Bld: 98 mg/dL (ref 65–99)
Potassium: 3.3 mmol/L — ABNORMAL LOW (ref 3.5–5.1)
SODIUM: 140 mmol/L (ref 135–145)
TOTAL PROTEIN: 8.6 g/dL — AB (ref 6.5–8.1)

## 2015-11-06 LAB — ETHANOL

## 2015-11-06 LAB — CK: Total CK: 253 U/L (ref 49–397)

## 2015-11-06 MED ORDER — IOPAMIDOL (ISOVUE-300) INJECTION 61%
100.0000 mL | Freq: Once | INTRAVENOUS | Status: AC | PRN
  Administered 2015-11-06: 100 mL via INTRAVENOUS

## 2015-11-06 NOTE — Discharge Instructions (Signed)
You have been seen today for a near syncopal episode. Your imaging and lab tests showed no acute abnormalities. It is recommended that you follow-up with a neurologist as soon as possible. Call the number provided to set up an appointment. It is also recommended that you stop using drugs, as this could be contributing to your symptoms. Follow up with PCP as needed. Return to ED should symptoms worsen.

## 2016-10-31 ENCOUNTER — Emergency Department (HOSPITAL_COMMUNITY): Payer: Medicare Other

## 2016-10-31 ENCOUNTER — Encounter (HOSPITAL_COMMUNITY): Payer: Self-pay

## 2016-10-31 ENCOUNTER — Emergency Department (HOSPITAL_COMMUNITY)
Admission: EM | Admit: 2016-10-31 | Discharge: 2016-10-31 | Disposition: A | Discharge status: Home / Self Care | Payer: Medicare Other | Attending: Emergency Medicine | Admitting: Emergency Medicine

## 2016-10-31 DIAGNOSIS — Z79899 Other long term (current) drug therapy: Secondary | ICD-10-CM | POA: Insufficient documentation

## 2016-10-31 DIAGNOSIS — Z87891 Personal history of nicotine dependence: Secondary | ICD-10-CM | POA: Diagnosis not present

## 2016-10-31 DIAGNOSIS — J440 Chronic obstructive pulmonary disease with acute lower respiratory infection: Secondary | ICD-10-CM | POA: Diagnosis not present

## 2016-10-31 DIAGNOSIS — R519 Headache, unspecified: Secondary | ICD-10-CM

## 2016-10-31 DIAGNOSIS — R51 Headache: Secondary | ICD-10-CM | POA: Diagnosis not present

## 2016-10-31 DIAGNOSIS — J209 Acute bronchitis, unspecified: Secondary | ICD-10-CM

## 2016-10-31 DIAGNOSIS — R05 Cough: Secondary | ICD-10-CM | POA: Diagnosis present

## 2016-10-31 DIAGNOSIS — I10 Essential (primary) hypertension: Secondary | ICD-10-CM | POA: Insufficient documentation

## 2016-10-31 MED ORDER — ACETAMINOPHEN 500 MG PO TABS
1000.0000 mg | ORAL_TABLET | Freq: Once | ORAL | Status: AC
  Administered 2016-10-31: 1000 mg via ORAL
  Filled 2016-10-31: qty 2

## 2016-10-31 MED ORDER — ALBUTEROL SULFATE HFA 108 (90 BASE) MCG/ACT IN AERS: 2.0000 | INHALATION_SPRAY | RESPIRATORY_TRACT | 3 refills | Status: AC | PRN

## 2016-10-31 MED ORDER — IPRATROPIUM BROMIDE 0.02 % IN SOLN
0.5000 mg | Freq: Once | RESPIRATORY_TRACT | Status: AC
  Administered 2016-10-31: 0.5 mg via RESPIRATORY_TRACT
  Filled 2016-10-31: qty 2.5

## 2016-10-31 MED ORDER — DOXYCYCLINE HYCLATE 100 MG PO CAPS: 100.0000 mg | ORAL_CAPSULE | Freq: Two times a day (BID) | ORAL | 0 refills | Status: DC

## 2016-10-31 MED ORDER — PREDNISONE 20 MG PO TABS
60.0000 mg | ORAL_TABLET | Freq: Once | ORAL | Status: AC
  Administered 2016-10-31: 60 mg via ORAL
  Filled 2016-10-31: qty 3

## 2016-10-31 MED ORDER — ALBUTEROL SULFATE (2.5 MG/3ML) 0.083% IN NEBU
5.0000 mg | INHALATION_SOLUTION | Freq: Once | RESPIRATORY_TRACT | Status: AC
  Administered 2016-10-31: 5 mg via RESPIRATORY_TRACT
  Filled 2016-10-31: qty 6

## 2016-10-31 MED ORDER — PREDNISONE 20 MG PO TABS: 40.0000 mg | ORAL_TABLET | Freq: Every day | ORAL | 0 refills | Status: DC

## 2016-10-31 NOTE — ED Provider Notes (Signed)
[facility]-EMERGENCY DEPT Provider Note   CSN: [id number] Arrival date & time: 10/31/16  1122     History   Chief Complaint Chief Complaint  Patient presents with  . Shortness of Breath  . Headache    HPI Jacobs Golab is a 71 y.o. male.  Patient c/o increased non prod cough, and mild sob for the past 2 weeks. Cough episodic, persistent. No sore throat or runny nose.  No fever or chills. +smoker. Uses mdi prn. Denies chest pain or discomfort. No leg pain or swelling. Also c/o severe frontal headache x 2 weeks. Pain gradual in onset, constant.  Denies head trauma. No syncope. No neck pain or stiffness. No associated neurologic symptoms. No change in speech or vision. No numbness/weakness, problems w balance/gait or normal function.  States unlike prior rare headaches.    The history is provided by the patient.  Shortness of Breath  Associated symptoms include headaches. Pertinent negatives include no fever, no rhinorrhea, no sore throat, no neck pain, no cough, no chest pain, no vomiting, no abdominal pain, no rash and no leg swelling.  Headache   Associated symptoms include shortness of breath. Pertinent negatives include no fever and no vomiting.    Past Medical History:  Diagnosis Date  . Arthritis   . GERD (gastroesophageal reflux disease)   . Hypertension   . Peptic ulcer disease     Patient Active Problem List   Diagnosis Date Noted  . Hip pain, bilateral 09/17/2014  . AKI (acute kidney injury) (HCC) 09/10/2014  . Orthostatic hypotension 09/10/2014  . Nausea and vomiting 09/10/2014  . Polysubstance abuse 09/10/2014  . Syncope 09/09/2014  . Peptic ulcer disease   . Benign prostatic hypertrophy (BPH) with incomplete bladder emptying 03/17/2014  . CHEST PAIN, RIGHT 05/23/2007  . Essential hypertension 04/05/2007  . NON-HEALING SURGICAL WOUND NEC 03/19/2007  . METHICILLIN-RESISTANT STAPHYLOCOCCUS AUREUS PNEUMONIA 03/15/2007  . EMPYEMA 03/15/2007    Past Surgical  History:  Procedure Laterality Date  . LUNG SURGERY  2008    right   . TRANSURETHRAL RESECTION OF PROSTATE N/A 03/17/2014   Procedure: TRANSURETHRAL RESECTION OF THE PROSTATE (TURP) WITH GYRUS;  Surgeon: Garnett Farm, MD;  Location: [facility];  Service: Urology;  Laterality: N/A;       Home Medications    Prior to Admission medications   Medication Sig Start Date End Date Taking? Authorizing Provider  acetaminophen (TYLENOL) 325 MG tablet Take 2 tablets (650 mg total) by mouth every 6 (six) hours as needed for mild pain (or Fever >/= 101). 09/12/14   Rama, Maryruth Bun, MD  amLODipine-benazepril (LOTREL) 10-40 MG per capsule Take 1 capsule by mouth every morning.    [provider]  cloNIDine (CATAPRES) 0.1 MG tablet Take 1 tablet (0.1 mg total) by mouth 2 (two) times daily. 09/12/14   Rama, Maryruth Bun, MD  feeding supplement, ENSURE ENLIVE, (ENSURE ENLIVE) LIQD Take 237 mLs by mouth 2 (two) times daily between meals. 09/12/14   Rama, Maryruth Bun, MD  folic acid (FOLVITE) 1 MG tablet Take 1 tablet (1 mg total) by mouth daily. 09/12/14   Rama, Maryruth Bun, MD  Multiple Vitamin (MULTIVITAMIN WITH MINERALS) TABS tablet Take 1 tablet by mouth daily. 09/12/14   Rama, Maryruth Bun, MD  omeprazole (PRILOSEC) 40 MG capsule Take 1 capsule (40 mg total) by mouth every morning. 09/07/14   Fayrene Helper, PA-C  oxyCODONE (OXY IR/ROXICODONE) 5 MG immediate release tablet Take 1-2 tablets (5-10 mg total) by mouth every  4 (four) hours as needed for moderate pain. Patient not taking: Reported on 09/17/2014 09/12/14   Rama, Maryruth Bun, MD  pravastatin (PRAVACHOL) 40 MG tablet Take 40 mg by mouth at bedtime.    [provider]  tamsulosin (FLOMAX) 0.4 MG CAPS capsule Take 1 capsule by mouth at bedtime. 08/24/14   [provider]  thiamine 100 MG tablet Take 1 tablet (100 mg total) by mouth daily. 09/12/14   Rama, Maryruth Bun, MD  traMADol (ULTRAM) 50 MG tablet Take 1 tablet (50 mg total) by  mouth every 8 (eight) hours as needed. 09/17/14   Jaclyn Shaggy, MD  traZODone (DESYREL) 150 MG tablet Take 150 mg by mouth at bedtime.     [provider]    Family History Family History  Problem Relation Age of Onset  . Cirrhosis Mother     Social History Social History  Substance Use Topics  . Smoking status: Former Smoker    Packs/day: 0.25    Years: 35.00    Types: Cigarettes  . Smokeless tobacco: Never Used  . Alcohol use No     Comment: hx of etoh use- 5 years ago      Allergies   Patient has no known allergies.   Review of Systems Review of Systems  Constitutional: Negative for fever.  HENT: Negative for congestion, rhinorrhea and sore throat.   Eyes: Negative for pain, redness and visual disturbance.  Respiratory: Positive for shortness of breath. Negative for cough.   Cardiovascular: Negative for chest pain and leg swelling.  Gastrointestinal: Negative for abdominal pain and vomiting.  Genitourinary: Negative for flank pain.  Musculoskeletal: Negative for back pain, neck pain and neck stiffness.  Skin: Negative for rash.  Neurological: Positive for headaches. Negative for weakness and numbness.  Hematological: Does not bruise/bleed easily.  Psychiatric/Behavioral: Negative for confusion.     Physical Exam Updated Vital Signs BP (!) 126/91 (BP Location: Left Arm)   Pulse 98   Temp 98.7 F (37.1 C) (Oral)   Resp 17   Ht 1.829 m (6')   Wt 64.9 kg (143 lb)   SpO2 100%   BMI 19.39 kg/m   Physical Exam  Constitutional: He is oriented to person, place, and time. He appears well-developed and well-nourished. No distress.  HENT:  Head: Atraumatic.  Mouth/Throat: Oropharynx is clear and moist.  No sinus or temporal tenderness.   Eyes: Conjunctivae and EOM are normal. Pupils are equal, round, and reactive to light.  Neck: Neck supple. No tracheal deviation present. No thyromegaly present.  No stiffness or rigidity  Cardiovascular: Normal rate,  regular rhythm, normal heart sounds and intact distal pulses.  Exam reveals no gallop and no friction rub.   No murmur heard. Pulmonary/Chest: Effort normal. No accessory muscle usage. No respiratory distress. He has wheezes.  Abdominal: Soft. Bowel sounds are normal. He exhibits no distension. There is no tenderness.  Musculoskeletal: He exhibits no edema or tenderness.  Neurological: He is alert and oriented to person, place, and time. No cranial nerve deficit.  Speech clear/fluent. Motor intact bil, stre 5/5. sens grossly intact.   Skin: Skin is warm and dry. No rash noted. He is not diaphoretic.  Psychiatric: He has a normal mood and affect.  Nursing note and vitals reviewed.    ED Treatments / Results  Labs (all labs ordered are listed, but only abnormal results are displayed) Results for orders placed or performed during the hospital encounter of 11/05/15  Urinalysis, Routine w reflex  microscopic  Result Value Ref Range   Color, Urine ORANGE (A) YELLOW   APPearance CLOUDY (A) CLEAR   Specific Gravity, Urine 1.039 (H) 1.005 - 1.030   pH 5.5 5.0 - 8.0   Glucose, UA NEGATIVE NEGATIVE mg/dL   Hgb urine dipstick NEGATIVE NEGATIVE   Bilirubin Urine SMALL (A) NEGATIVE   Ketones, ur NEGATIVE NEGATIVE mg/dL   Protein, ur 30 (A) NEGATIVE mg/dL   Nitrite NEGATIVE NEGATIVE   Leukocytes, UA SMALL (A) NEGATIVE  Comprehensive metabolic panel  Result Value Ref Range   Sodium 140 135 - 145 mmol/L   Potassium 3.3 (L) 3.5 - 5.1 mmol/L   Chloride 108 101 - 111 mmol/L   CO2 23 22 - 32 mmol/L   Glucose, Bld 98 65 - 99 mg/dL   BUN 15 6 - 20 mg/dL   Creatinine, Ser 4.09 0.61 - 1.24 mg/dL   Calcium 9.5 8.9 - 81.1 mg/dL   Total Protein 8.6 (H) 6.5 - 8.1 g/dL   Albumin 3.8 3.5 - 5.0 g/dL   AST 28 15 - 41 U/L   ALT 22 17 - 63 U/L   Alkaline Phosphatase 93 38 - 126 U/L   Total Bilirubin 1.0 0.3 - 1.2 mg/dL   GFR calc non Af Amer >60 >60 mL/min   GFR calc Af Amer >60 >60 mL/min   Anion gap 9  5 - 15  CBC with Differential  Result Value Ref Range   WBC 11.1 (H) 4.0 - 10.5 K/uL   RBC 4.82 4.22 - 5.81 MIL/uL   Hemoglobin 13.9 13.0 - 17.0 g/dL   HCT 91.4 78.2 - 95.6 %   MCV 90.5 78.0 - 100.0 fL   MCH 28.8 26.0 - 34.0 pg   MCHC 31.9 30.0 - 36.0 g/dL   RDW 21.3 08.6 - 57.8 %   Platelets 306 150 - 400 K/uL   Neutrophils Relative % 76 %   Neutro Abs 8.5 (H) 1.7 - 7.7 K/uL   Lymphocytes Relative 14 %   Lymphs Abs 1.6 0.7 - 4.0 K/uL   Monocytes Relative 9 %   Monocytes Absolute 1.0 0.1 - 1.0 K/uL   Eosinophils Relative 1 %   Eosinophils Absolute 0.1 0.0 - 0.7 K/uL   Basophils Relative 0 %   Basophils Absolute 0.0 0.0 - 0.1 K/uL  CK  Result Value Ref Range   Total CK 253 49 - 397 U/L  Ethanol  Result Value Ref Range   Alcohol, Ethyl (B) <5 <5 mg/dL  Urine rapid drug screen (hosp performed)  Result Value Ref Range   Opiates POSITIVE (A) NONE DETECTED   Cocaine POSITIVE (A) NONE DETECTED   Benzodiazepines POSITIVE (A) NONE DETECTED   Amphetamines POSITIVE (A) NONE DETECTED   Tetrahydrocannabinol NONE DETECTED NONE DETECTED   Barbiturates NONE DETECTED NONE DETECTED  Urine microscopic-add on  Result Value Ref Range   Squamous Epithelial / LPF 0-5 (A) NONE SEEN   WBC, UA 6-30 0 - 5 WBC/hpf   RBC / HPF 0-5 0 - 5 RBC/hpf   Bacteria, UA FEW (A) NONE SEEN   Casts HYALINE CASTS (A) NEGATIVE   Urine-Other YEAST PRESENT   CBG monitoring, ED  Result Value Ref Range   Glucose-Capillary 104 (H) 65 - 99 mg/dL  CBG monitoring, ED  Result Value Ref Range   Glucose-Capillary 103 (H) 65 - 99 mg/dL  I-stat troponin, ED  (not at [facility])  Result Value Ref Range   Troponin i, poc 0.00 0.00 -  0.08 ng/mL   Comment 3           Dg Chest 2 View  Result Date: 10/31/2016 CLINICAL DATA:  Severe headache and shortness of breath for 2 weeks. EXAM: CHEST  2 VIEW COMPARISON:  Chest CT 03/17/2014 and chest x-ray 03/10/2014 FINDINGS: The cardiac silhouette, mediastinal and hilar contours are  within normal limits and stable. Mild tortuosity and calcification of the thoracic aorta. Stable emphysematous changes and pulmonary scarring. No infiltrates, edema or effusions. Stable nodular density in the right mid lung since 2015. No new lesions. The bony thorax is intact. IMPRESSION: Chronic lung changes.  No acute overlying pulmonary process. Electronically Signed   By: Rudie Meyer M.D.   On: 10/31/2016 12:43   Ct Head Wo Contrast  Result Date: 10/31/2016 CLINICAL DATA:  Frontal headache for 2 weeks.  Blurred vision. EXAM: CT HEAD WITHOUT CONTRAST TECHNIQUE: Contiguous axial images were obtained from the base of the skull through the vertex without intravenous contrast. COMPARISON:  CT scan of November 05, 2015. FINDINGS: Brain: Mild chronic ischemic white matter disease is noted. Old lacunar infarctions are noted in the basal ganglia bilaterally. No mass effect or midline shift is noted. Ventricular size is within normal limits. There is no evidence of mass lesion, hemorrhage or acute infarction. Vascular: Atherosclerosis of carotid siphons is noted. Skull: Normal. Negative for fracture or focal lesion. Sinuses/Orbits: No acute finding. Other: None. IMPRESSION: Mild chronic ischemic white matter disease. 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 No acute intracranial abnormality seen. Electronically Signed   By: Lupita Raider, M.D.   On: 10/31/2016 13:32    EKG  EKG Interpretation None       Radiology Dg Chest 2 View  Result Date: 10/31/2016 CLINICAL DATA:  Severe headache and shortness of breath for 2 weeks. EXAM: CHEST  2 VIEW COMPARISON:  Chest CT 03/17/2014 and chest x-ray 03/10/2014 FINDINGS: The cardiac silhouette, mediastinal and hilar contours are within normal limits and stable. Mild tortuosity and calcification of the thoracic aorta. Stable emphysematous changes and pulmonary scarring. No infiltrates, edema or effusions. Stable nodular density in the right mid lung  since 2015. No new lesions. The bony thorax is intact. IMPRESSION: Chronic lung changes.  No acute overlying pulmonary process. Electronically Signed   By: Rudie Meyer M.D.   On: 10/31/2016 12:43   Ct Head Wo Contrast  Result Date: 10/31/2016 CLINICAL DATA:  Frontal headache for 2 weeks.  Blurred vision. EXAM: CT HEAD WITHOUT CONTRAST TECHNIQUE: Contiguous axial images were obtained from the base of the skull through the vertex without intravenous contrast. COMPARISON:  CT scan of November 05, 2015. FINDINGS: Brain: Mild chronic ischemic white matter disease is noted. Old lacunar infarctions are noted in the basal ganglia bilaterally. No mass effect or midline shift is noted. Ventricular size is within normal limits. There is no evidence of mass lesion, hemorrhage or acute infarction. Vascular: Atherosclerosis of carotid siphons is noted. Skull: Normal. Negative for fracture or focal lesion. Sinuses/Orbits: No acute finding. Other: None. IMPRESSION: Mild chronic ischemic white matter disease. 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 No acute intracranial abnormality seen. Electronically Signed   By: Lupita Raider, M.D.   On: 10/31/2016 13:32    Procedures Procedures (including critical care time)  Medications Ordered in ED Medications  acetaminophen (TYLENOL) tablet 1,000 mg (not administered)  albuterol (PROVENTIL) (2.5 MG/3ML) 0.083% nebulizer solution 5 mg (5 mg Nebulization Given 10/31/16 1237)     Initial Impression /  Assessment and Plan / ED Course  I have reviewed the triage vital signs and the nursing notes.  Pertinent labs & imaging results that were available during my care of the patient were reviewed by me and considered in my medical decision making (see chart for details).  Imaging studies.   Albuterol neb.   Reviewed nursing notes and prior charts for additional history.   Recheck mild wheezing.  Albuterol neb. pred po.  Given prod cough, copd, will also  rx antibiotic.   No chest pain. Breathing comfortably. Afeb.   Ct neg.  Patient currently appears stable for d/c.     Final Clinical Impressions(s) / ED Diagnoses   Final diagnoses:  None    New Prescriptions New Prescriptions   No medications on file     Cathren LaineSteinl, Daiya Tamer, MD 10/31/16 1421

## 2016-10-31 NOTE — Discharge Instructions (Signed)
It was our pleasure to provide your ER care today - we hope that you feel better.  Take antibiotic (doxycycline) as prescribed.  Take prednisone, and use albuterol inhaler as need.   Avoid any smoking or smoky areas.   Take acetaminophen as need for pain.  Follow up with primary care doctor/pulmonary specialist in the next 1-2 weeks.  Return to ER if worse, increased trouble breathing, chest pain, other concern.

## 2016-10-31 NOTE — ED Triage Notes (Signed)
Patient c/o headache x 2 weeks. Patient c/o nausea, blurred vision and sensitivity to light.   Patient also c/o SOB x 2 weeks. Patient has a productive cough with clear to brown sputum x 2 weeks.

## 2016-11-08 ENCOUNTER — Inpatient Hospital Stay (HOSPITAL_COMMUNITY)
Admission: EM | Admit: 2016-11-08 | Discharge: 2016-11-11 | DRG: 683 | Disposition: A | Discharge status: Home / Self Care | Payer: Medicare Other | Attending: Family Medicine | Admitting: Family Medicine

## 2016-11-08 ENCOUNTER — Encounter (HOSPITAL_COMMUNITY): Payer: Self-pay | Admitting: *Deleted

## 2016-11-08 ENCOUNTER — Observation Stay (HOSPITAL_COMMUNITY): Payer: Medicare Other

## 2016-11-08 ENCOUNTER — Emergency Department (HOSPITAL_COMMUNITY): Payer: Medicare Other

## 2016-11-08 DIAGNOSIS — E860 Dehydration: Secondary | ICD-10-CM | POA: Diagnosis present

## 2016-11-08 DIAGNOSIS — Z79899 Other long term (current) drug therapy: Secondary | ICD-10-CM

## 2016-11-08 DIAGNOSIS — F1920 Other psychoactive substance dependence, uncomplicated: Secondary | ICD-10-CM | POA: Diagnosis present

## 2016-11-08 DIAGNOSIS — K219 Gastro-esophageal reflux disease without esophagitis: Secondary | ICD-10-CM | POA: Diagnosis not present

## 2016-11-08 DIAGNOSIS — E872 Acidosis: Secondary | ICD-10-CM | POA: Diagnosis present

## 2016-11-08 DIAGNOSIS — N179 Acute kidney failure, unspecified: Secondary | ICD-10-CM | POA: Diagnosis not present

## 2016-11-08 DIAGNOSIS — N170 Acute kidney failure with tubular necrosis: Principal | ICD-10-CM | POA: Diagnosis present

## 2016-11-08 DIAGNOSIS — R109 Unspecified abdominal pain: Secondary | ICD-10-CM

## 2016-11-08 DIAGNOSIS — R51 Headache: Secondary | ICD-10-CM | POA: Diagnosis not present

## 2016-11-08 DIAGNOSIS — N400 Enlarged prostate without lower urinary tract symptoms: Secondary | ICD-10-CM | POA: Diagnosis present

## 2016-11-08 DIAGNOSIS — G4700 Insomnia, unspecified: Secondary | ICD-10-CM | POA: Diagnosis present

## 2016-11-08 DIAGNOSIS — Z6822 Body mass index (BMI) 22.0-22.9, adult: Secondary | ICD-10-CM

## 2016-11-08 DIAGNOSIS — F1911 Other psychoactive substance abuse, in remission: Secondary | ICD-10-CM | POA: Diagnosis present

## 2016-11-08 DIAGNOSIS — I10 Essential (primary) hypertension: Secondary | ICD-10-CM | POA: Diagnosis not present

## 2016-11-08 DIAGNOSIS — F319 Bipolar disorder, unspecified: Secondary | ICD-10-CM | POA: Diagnosis present

## 2016-11-08 DIAGNOSIS — J449 Chronic obstructive pulmonary disease, unspecified: Secondary | ICD-10-CM | POA: Diagnosis not present

## 2016-11-08 DIAGNOSIS — M47814 Spondylosis without myelopathy or radiculopathy, thoracic region: Secondary | ICD-10-CM | POA: Diagnosis present

## 2016-11-08 DIAGNOSIS — R519 Headache, unspecified: Secondary | ICD-10-CM

## 2016-11-08 DIAGNOSIS — R338 Other retention of urine: Secondary | ICD-10-CM | POA: Diagnosis present

## 2016-11-08 DIAGNOSIS — T464X5A Adverse effect of angiotensin-converting-enzyme inhibitors, initial encounter: Secondary | ICD-10-CM | POA: Diagnosis present

## 2016-11-08 DIAGNOSIS — R630 Anorexia: Secondary | ICD-10-CM | POA: Diagnosis present

## 2016-11-08 DIAGNOSIS — N401 Enlarged prostate with lower urinary tract symptoms: Secondary | ICD-10-CM | POA: Diagnosis present

## 2016-11-08 DIAGNOSIS — Z8711 Personal history of peptic ulcer disease: Secondary | ICD-10-CM

## 2016-11-08 DIAGNOSIS — Z87891 Personal history of nicotine dependence: Secondary | ICD-10-CM

## 2016-11-08 HISTORY — DX: Cerebral infarction, unspecified: I63.9

## 2016-11-08 HISTORY — DX: Unspecified asthma, uncomplicated: J45.909

## 2016-11-08 HISTORY — DX: Migraine, unspecified, not intractable, without status migrainosus: G43.909

## 2016-11-08 HISTORY — DX: Pneumonia, unspecified organism: J18.9

## 2016-11-08 HISTORY — DX: Pure hypercholesterolemia, unspecified: E78.00

## 2016-11-08 HISTORY — DX: Chronic obstructive pulmonary disease, unspecified: J44.9

## 2016-11-08 LAB — URINALYSIS, ROUTINE W REFLEX MICROSCOPIC
BILIRUBIN URINE: NEGATIVE
GLUCOSE, UA: NEGATIVE mg/dL
HGB URINE DIPSTICK: NEGATIVE
KETONES UR: 5 mg/dL — AB
NITRITE: NEGATIVE
PH: 5 (ref 5.0–8.0)
Protein, ur: NEGATIVE mg/dL
SPECIFIC GRAVITY, URINE: 1.024 (ref 1.005–1.030)

## 2016-11-08 LAB — CBC
HCT: 47.4 % (ref 39.0–52.0)
HEMOGLOBIN: 14.9 g/dL (ref 13.0–17.0)
MCH: 28.3 pg (ref 26.0–34.0)
MCHC: 31.4 g/dL (ref 30.0–36.0)
MCV: 90.1 fL (ref 78.0–100.0)
PLATELETS: 491 10*3/uL — AB (ref 150–400)
RBC: 5.26 MIL/uL (ref 4.22–5.81)
RDW: 16 % — ABNORMAL HIGH (ref 11.5–15.5)
WBC: 12.5 10*3/uL — AB (ref 4.0–10.5)

## 2016-11-08 LAB — BASIC METABOLIC PANEL
Anion gap: 12 (ref 5–15)
BUN: 76 mg/dL — ABNORMAL HIGH (ref 6–20)
CALCIUM: 10.2 mg/dL (ref 8.9–10.3)
CO2: 16 mmol/L — AB (ref 22–32)
CREATININE: 2.18 mg/dL — AB (ref 0.61–1.24)
Chloride: 111 mmol/L (ref 101–111)
GFR calc non Af Amer: 29 mL/min — ABNORMAL LOW (ref >60)
GFR, EST AFRICAN AMERICAN: 34 mL/min — AB (ref >60)
Glucose, Bld: 129 mg/dL — ABNORMAL HIGH (ref 65–99)
Potassium: 3.9 mmol/L (ref 3.5–5.1)
SODIUM: 139 mmol/L (ref 135–145)

## 2016-11-08 LAB — RAPID URINE DRUG SCREEN, HOSP PERFORMED
Amphetamines: POSITIVE — AB
BARBITURATES: NOT DETECTED
Benzodiazepines: NOT DETECTED
COCAINE: POSITIVE — AB
Opiates: POSITIVE — AB
TETRAHYDROCANNABINOL: NOT DETECTED

## 2016-11-08 LAB — PHOSPHORUS: Phosphorus: 3 mg/dL (ref 2.5–4.6)

## 2016-11-08 LAB — ETHANOL

## 2016-11-08 LAB — MAGNESIUM: Magnesium: 2.2 mg/dL (ref 1.7–2.4)

## 2016-11-08 MED ORDER — SODIUM CHLORIDE 0.9 % IV SOLN
INTRAVENOUS | Status: DC
  Administered 2016-11-08 – 2016-11-10 (×6): via INTRAVENOUS

## 2016-11-08 MED ORDER — METOCLOPRAMIDE HCL 5 MG/ML IJ SOLN
5.0000 mg | Freq: Once | INTRAMUSCULAR | Status: AC
  Administered 2016-11-08: 5 mg via INTRAVENOUS
  Filled 2016-11-08: qty 2

## 2016-11-08 MED ORDER — ACETAMINOPHEN 325 MG PO TABS: 650.0000 mg | ORAL_TABLET | Freq: Four times a day (QID) | ORAL | Status: DC | PRN

## 2016-11-08 MED ORDER — SODIUM CHLORIDE 0.9% FLUSH
3.0000 mL | Freq: Two times a day (BID) | INTRAVENOUS | Status: DC
  Administered 2016-11-09: 3 mL via INTRAVENOUS

## 2016-11-08 MED ORDER — SODIUM CHLORIDE 0.9 % IV BOLUS (SEPSIS)
1000.0000 mL | Freq: Once | INTRAVENOUS | Status: AC
  Administered 2016-11-08: 1000 mL via INTRAVENOUS

## 2016-11-08 MED ORDER — PANTOPRAZOLE SODIUM 40 MG PO TBEC
40.0000 mg | DELAYED_RELEASE_TABLET | Freq: Every day | ORAL | Status: DC
  Administered 2016-11-08 – 2016-11-11 (×4): 40 mg via ORAL
  Filled 2016-11-08 (×4): qty 1

## 2016-11-08 MED ORDER — DIPHENHYDRAMINE HCL 50 MG/ML IJ SOLN
12.5000 mg | Freq: Once | INTRAMUSCULAR | Status: AC
  Administered 2016-11-08: 12.5 mg via INTRAVENOUS
  Filled 2016-11-08: qty 1

## 2016-11-08 MED ORDER — ENOXAPARIN SODIUM 30 MG/0.3ML ~~LOC~~ SOLN
30.0000 mg | SUBCUTANEOUS | Status: DC
  Administered 2016-11-08: 30 mg via SUBCUTANEOUS
  Filled 2016-11-08: qty 0.3

## 2016-11-08 MED ORDER — TRAZODONE HCL 50 MG PO TABS
150.0000 mg | ORAL_TABLET | Freq: Every day | ORAL | Status: DC
  Administered 2016-11-08 – 2016-11-10 (×3): 150 mg via ORAL
  Filled 2016-11-08 (×3): qty 1

## 2016-11-08 MED ORDER — ZOLPIDEM TARTRATE 5 MG PO TABS
10.0000 mg | ORAL_TABLET | Freq: Every day | ORAL | Status: DC
  Administered 2016-11-08 – 2016-11-10 (×3): 10 mg via ORAL
  Filled 2016-11-08 (×3): qty 2

## 2016-11-08 MED ORDER — ACETAMINOPHEN 650 MG RE SUPP: 650.0000 mg | Freq: Four times a day (QID) | RECTAL | Status: DC | PRN

## 2016-11-08 MED ORDER — TOPIRAMATE 100 MG PO TABS
50.0000 mg | ORAL_TABLET | Freq: Two times a day (BID) | ORAL | Status: DC
  Administered 2016-11-08 – 2016-11-11 (×6): 50 mg via ORAL
  Filled 2016-11-08 (×6): qty 1

## 2016-11-08 MED ORDER — ALBUTEROL SULFATE (2.5 MG/3ML) 0.083% IN NEBU: 3.0000 mL | INHALATION_SOLUTION | RESPIRATORY_TRACT | Status: DC | PRN

## 2016-11-08 NOTE — ED Provider Notes (Signed)
[facility]-EMERGENCY DEPT Provider Note   CSN: [id number] Arrival date & time: 11/08/16  1115     History   Chief Complaint Chief Complaint  Patient presents with  . Shortness of Breath    HPI [name] is a 71 y.o. male.  HPI  71 year old male presents with continued headache and shortness of breath. Patient was seen at [facility] long on 6/11 with similar symptoms. He states the symptoms have been going on for 2 weeks. They have been steady, constant, and not worsening or improving. It is a frontal headache that feels like a throbbing. He also feels short of breath. Denies any chest pain. Denies any neck pain or stiffness. He does feel dizzy and has had some blurred vision. He states he's behind one episode of vomiting per day. No diarrhea. No back pain, abdominal pain, or chest pain. No diarrhea. He states he has not had hematuria or dysuria but states that his urine has been "dribbling". However he also tells me that he has had normal urine output. Patient states she's been taking the medicines he was prescribed after last visit but this has not been helping.  Past Medical History:  Diagnosis Date  . Arthritis   . COPD (chronic obstructive pulmonary disease) (HCC)   . GERD (gastroesophageal reflux disease)   . Hypertension   . Peptic ulcer disease     Patient Active Problem List   Diagnosis Date Noted  . Hypertension 11/08/2016  . COPD (chronic obstructive pulmonary disease) (HCC) 11/08/2016  . GERD (gastroesophageal reflux disease) 11/08/2016  . Acute kidney injury (HCC) 11/08/2016  . BPH (benign prostatic hyperplasia) 11/08/2016  . Polysubstance dependence (HCC) 11/08/2016  . Hip pain, bilateral 09/17/2014  . AKI (acute kidney injury) (HCC) 09/10/2014  . Orthostatic hypotension 09/10/2014  . Nausea and vomiting 09/10/2014  . Polysubstance abuse 09/10/2014  . Syncope 09/09/2014  . Peptic ulcer disease   . Benign prostatic hypertrophy (BPH) with incomplete bladder  emptying 03/17/2014  . CHEST PAIN, RIGHT 05/23/2007  . Essential hypertension 04/05/2007  . NON-HEALING SURGICAL WOUND NEC 03/19/2007  . METHICILLIN-RESISTANT STAPHYLOCOCCUS AUREUS PNEUMONIA 03/15/2007  . EMPYEMA 03/15/2007    Past Surgical History:  Procedure Laterality Date  . LUNG SURGERY  2008    right   . TRANSURETHRAL RESECTION OF PROSTATE N/A 03/17/2014   Procedure: TRANSURETHRAL RESECTION OF THE PROSTATE (TURP) WITH GYRUS;  Surgeon: Garnett Farm, MD;  Location: [facility] ORS;  Service: Urology;  Laterality: N/A;       Home Medications    Prior to Admission medications   Medication Sig Start Date End Date Taking? Authorizing Provider  acetaminophen (TYLENOL) 325 MG tablet Take 2 tablets (650 mg total) by mouth every 6 (six) hours as needed for mild pain (or Fever >/= 101). 09/12/14  Yes Rama, Maryruth Bun, MD  albuterol (PROVENTIL HFA;VENTOLIN HFA) 108 (90 Base) MCG/ACT inhaler Inhale 2 puffs into the lungs every 4 (four) hours as needed for wheezing or shortness of breath. 10/31/16  Yes Cathren Laine, MD  amLODipine-benazepril (LOTREL) 10-40 MG per capsule Take 1 capsule by mouth every morning.   Yes [provider]  cloNIDine (CATAPRES) 0.1 MG tablet Take 1 tablet (0.1 mg total) by mouth 2 (two) times daily. Patient taking differently: Take 0.1-0.2 mg by mouth See admin instructions. 0.1 mg in the morning and 0.2 mg at bedtime 09/12/14  Yes Rama, Maryruth Bun, MD  omeprazole (PRILOSEC) 40 MG capsule Take 1 capsule (40 mg total) by mouth every morning. 09/07/14  Yes Fayrene Helper, PA-C  SUBOXONE 8-2 MG FILM Place 1 Film under the tongue 2 (two) times daily.  10/31/16  Yes [provider]  topiramate (TOPAMAX) 50 MG tablet Take 50 mg by mouth 2 (two) times daily.   Yes [provider]  traZODone (DESYREL) 150 MG tablet Take 150 mg by mouth at bedtime.    Yes [provider]  zolpidem (AMBIEN) 10 MG tablet Take 10 mg by mouth at bedtime.   Yes [provider]  doxycycline (VIBRAMYCIN) 100 MG capsule Take 1 capsule (100 mg total) by mouth 2 (two) times daily. Patient not taking: Reported on 11/08/2016 10/31/16   Cathren Laine, MD  feeding supplement, ENSURE ENLIVE, (ENSURE ENLIVE) LIQD Take 237 mLs by mouth 2 (two) times daily between meals. Patient not taking: Reported on 11/08/2016 09/12/14   Rama, Maryruth Bun, MD  folic acid (FOLVITE) 1 MG tablet Take 1 tablet (1 mg total) by mouth daily. Patient not taking: Reported on 11/08/2016 09/12/14   Rama, Maryruth Bun, MD  Multiple Vitamin (MULTIVITAMIN WITH MINERALS) TABS tablet Take 1 tablet by mouth daily. Patient not taking: Reported on 11/08/2016 09/12/14   Rama, Maryruth Bun, MD  oxyCODONE (OXY IR/ROXICODONE) 5 MG immediate release tablet Take 1-2 tablets (5-10 mg total) by mouth every 4 (four) hours as needed for moderate pain. Patient not taking: Reported on 11/08/2016 09/12/14   Rama, Maryruth Bun, MD  predniSONE (DELTASONE) 20 MG tablet Take 2 tablets (40 mg total) by mouth daily. Patient not taking: Reported on 11/08/2016 10/31/16   Cathren Laine, MD  thiamine 100 MG tablet Take 1 tablet (100 mg total) by mouth daily. Patient not taking: Reported on 11/08/2016 09/12/14   Rama, Maryruth Bun, MD  traMADol (ULTRAM) 50 MG tablet Take 1 tablet (50 mg total) by mouth every 8 (eight) hours as needed. Patient not taking: Reported on 11/08/2016 09/17/14   Jaclyn Shaggy, MD    Family History Family History  Problem Relation Age of Onset  . Cirrhosis Mother     Social History Social History  Substance Use Topics  . Smoking status: Former Smoker    Packs/day: 0.25    Years: 35.00    Types: Cigarettes  . Smokeless tobacco: Never Used  . Alcohol use No     Comment: hx of etoh use- 5 years ago      Allergies   Patient has no known allergies.   Review of Systems Review of Systems  Constitutional: Negative for fever.  Eyes: Positive for visual disturbance. Negative for photophobia and pain.    Respiratory: Positive for shortness of breath. Negative for cough.   Cardiovascular: Negative for chest pain and leg swelling.  Gastrointestinal: Positive for vomiting. Negative for abdominal pain and diarrhea.  Musculoskeletal: Negative for back pain, neck pain and neck stiffness.  Neurological: Positive for dizziness and headaches.  All other systems reviewed and are negative.    Physical Exam Updated Vital Signs BP 123/88   Pulse 98   Temp 97.7 F (36.5 C) (Oral)   Resp 19   Ht 6' (1.829 m)   Wt 64.9 kg (143 lb)   SpO2 98%   BMI 19.39 kg/m   Physical Exam  Constitutional: He is oriented to person, place, and time. He appears well-developed and well-nourished. No distress.  HENT:  Head: Normocephalic and atraumatic.  Right Ear: External ear normal.  Left Ear: External ear normal.  Nose: Nose normal.  Eyes: Right eye exhibits no discharge. Left eye  exhibits no discharge.  Neck: Neck supple.  Cardiovascular: Normal rate, regular rhythm and normal heart sounds.   Pulmonary/Chest: Effort normal and breath sounds normal.  Abdominal: Soft. There is no tenderness.  Musculoskeletal: He exhibits no edema.  Neurological: He is alert and oriented to person, place, and time.  CN 3-12 grossly intact. 5/5 strength in all 4 extremities. Grossly normal sensation. Normal finger to nose.   Skin: Skin is warm and dry. He is not diaphoretic.  Nursing note and vitals reviewed.    ED Treatments / Results  Labs (all labs ordered are listed, but only abnormal results are displayed) Labs Reviewed  BASIC METABOLIC PANEL - Abnormal; Notable for the following:       Result Value   CO2 16 (*)    Glucose, Bld 129 (*)    BUN 76 (*)    Creatinine, Ser 2.18 (*)    GFR calc non Af Amer 29 (*)    GFR calc Af Amer 34 (*)    All other components within normal limits  CBC - Abnormal; Notable for the following:    WBC 12.5 (*)    RDW 16.0 (*)    Platelets 491 (*)    All other components  within normal limits  URINALYSIS, ROUTINE W REFLEX MICROSCOPIC  RAPID URINE DRUG SCREEN, HOSP PERFORMED  ETHANOL    EKG  EKG Interpretation  Date/Time:  Tuesday November 08 2016 11:18:40 EDT Ventricular Rate:  120 PR Interval:  130 QRS Duration: 98 QT Interval:  354 QTC Calculation: 500 R Axis:   75 Text Interpretation:  Sinus tachycardia Otherwise normal ECG rate is faster, otherwise similar to October 31 2016 Confirmed by Pricilla LovelessGoldston, Maeby Vankleeck [phone]([id number]) on 11/08/2016 2:31:45 PM       Radiology Dg Chest 2 View  Result Date: 11/08/2016 CLINICAL DATA:  Shortness of breath for 2 weeks. History of COPD and hypertension. Former smoker EXAM: CHEST  2 VIEW COMPARISON:  Radiographs 10/31/2016.  CT 03/17/2014. FINDINGS: The heart size and mediastinal contours are stable. The lungs are hyperinflated with stable blunting of both costophrenic angles. No evidence of airspace disease, edema, pleural effusion or pneumothorax. Retrosternal nodular density on the lateral view is unchanged, pleural based on CT. Stable mild thoracic spine degenerative changes. IMPRESSION: Stable chronic findings consistent with chronic obstructive pulmonary disease. No acute cardiopulmonary process. Electronically Signed   By: Carey BullocksWilliam  Veazey M.D.   On: 11/08/2016 15:04    Procedures Procedures (including critical care time)  Medications Ordered in ED Medications  0.9 %  sodium chloride infusion (not administered)  sodium chloride 0.9 % bolus 1,000 mL (1,000 mLs Intravenous New Bag/Given 11/08/16 1545)  metoCLOPramide (REGLAN) injection 5 mg (5 mg Intravenous Given 11/08/16 1546)  diphenhydrAMINE (BENADRYL) injection 12.5 mg (12.5 mg Intravenous Given 11/08/16 1545)     Initial Impression / Assessment and Plan / ED Course  I have reviewed the triage vital signs and the nursing notes.  Pertinent labs & imaging results that were available during my care of the patient were reviewed by me and considered in my medical decision  making (see chart for details).     Patient's headache and shorts breath is probably from the new onset renal failure. He has some dribbling when urinating so a postvoid residual be obtained to rule out urinary retention. Otherwise he is on some blood pressure medicine that could contribute to renal disease. Given the continued but not progressive headache and no neuro findings at all think repeat CT is  needed. PE is less likely as well. Will start IV fluids and admit to the hospitalist for further workup and care.  Final Clinical Impressions(s) / ED Diagnoses   Final diagnoses:  Acute kidney injury (HCC)  Frontal headache    New Prescriptions New Prescriptions   No medications on file     Pricilla Loveless, MD 11/08/16 [phone]

## 2016-11-08 NOTE — H&P (Signed)
History and Physical    [name] ZOX:[id number] DOB: [birth year] DOA: 11/08/2016   PCP: [name], MD   Patient coming from/Resides with: Private residence/lives with his brother and his wife  Chief Complaint: Short of breath and headache 2 weeks  HPI: [name] is a 71 y.o. male with medical history significant for hypertension, COPD, reflux, BPH, ongoing tobacco abuse and history of prior polysubstance abuse. Patient was evaluated in the ER on 6/11 for shortness of breath and was diagnosed with a COPD exacerbation and given a prednisone taper and doxycycline. At that time his renal function was within normal limits. He returns today with the above complaints. Chest x-ray was negative, CT of the head including sinuses was negative. Labs revealed acute kidney injury with a BUN of 76 and creatinine 2.18 with associated metabolic acidemia. In review of prior admission documentation, patient was admitted in 2016 with a similar presentation. His renal failure was felt to be multifactorial in etiology 2/2 dehydration and orthostasis as well as incomplete bladder emptying in setting of BPH. He does report that initially after being treated with steroids and antibiotics for his COPD symptoms his respiratory status improved. He states current shortness of breath is different.  ED Course:  Vital Signs: BP 123/88   Pulse 98   Temp 97.7 F (36.5 C) (Oral)   Resp 19   Ht 6' (1.829 m)   Wt 64.9 kg (143 lb)   SpO2 98%   BMI 19.39 kg/m  Chest x-ray: As above CT head without contrast: As above Lab data: Sodium 139, potassium 3.9, chloride 111, CO2 16, glucose 129, BUN 76, creatinine 2.18, calcium 10.2, anion gap 12, white count 12,500 differential not obtained, hemoglobin 14.9, platelets 491,000 Medications and treatments: Normal saline bolus 1 L, Reglan 5 mg IV 1, Benadryl 12.5 mg IV 1  Review of Systems:  In addition to the HPI above,  No Fever-chills, myalgias or  other constitutional symptoms No changes with Vision or hearing, new weakness, tingling, numbness in any extremity, dizziness, dysarthria or word finding difficulty, gait disturbance or imbalance, tremors or seizure activity No problems swallowing food or Liquids, indigestion/reflux, choking or coughing while eating, abdominal pain with or after eating No Chest pain, Cough, palpitations, orthopnea or DOE No Abdominal pain, N/V, melena,hematochezia, dark tarry stools, constipation No dysuria, malodorous urine, hematuria or flank pain No new skin rashes, lesions, masses or bruises, No new joint pains, aches, swelling or redness No recent unintentional weight gain or loss No polyuria, polydypsia or polyphagia   Past Medical History:  Diagnosis Date  . Arthritis   . COPD (chronic obstructive pulmonary disease) (HCC)   . GERD (gastroesophageal reflux disease)   . Hypertension   . Peptic ulcer disease     Past Surgical History:  Procedure Laterality Date  . LUNG SURGERY  2008    right   . TRANSURETHRAL RESECTION OF PROSTATE N/A 03/17/2014   Procedure: TRANSURETHRAL RESECTION OF THE PROSTATE (TURP) WITH GYRUS;  Surgeon: Garnett Farm, MD;  Location: [facility];  Service: Urology;  Laterality: N/A;    Social History   Social History  . Marital status: Divorced    Spouse name: N/A  . Number of children: N/A  . Years of education: N/A   Occupational History  . Not on file.   Social History Main Topics  . Smoking status: Former Smoker    Packs/day: 0.25    Years: 35.00    Types: Cigarettes  . Smokeless tobacco:  Never Used  . Alcohol use No     Comment: hx of etoh use- 5 years ago   . Drug use: Yes    Types: Heroin     Comment: hx of last use, 1 week ago  . Sexual activity: No   Other Topics Concern  . Not on file   Social History Narrative  . No narrative on file    Mobility: Independent Work history: Retired/disabled   No Known Allergies  Family History  Problem  Relation Age of Onset  . Cirrhosis Mother      Prior to Admission medications   Medication Sig Start Date End Date Taking? Authorizing Provider  acetaminophen (TYLENOL) 325 MG tablet Take 2 tablets (650 mg total) by mouth every 6 (six) hours as needed for mild pain (or Fever >/= 101). 09/12/14  Yes Rama, Maryruth Bun, MD  albuterol (PROVENTIL HFA;VENTOLIN HFA) 108 (90 Base) MCG/ACT inhaler Inhale 2 puffs into the lungs every 4 (four) hours as needed for wheezing or shortness of breath. 10/31/16  Yes Cathren Laine, MD  amLODipine-benazepril (LOTREL) 10-40 MG per capsule Take 1 capsule by mouth every morning.   Yes [provider]  cloNIDine (CATAPRES) 0.1 MG tablet Take 1 tablet (0.1 mg total) by mouth 2 (two) times daily. Patient taking differently: Take 0.1-0.2 mg by mouth See admin instructions. 0.1 mg in the morning and 0.2 mg at bedtime 09/12/14  Yes Rama, Maryruth Bun, MD  omeprazole (PRILOSEC) 40 MG capsule Take 1 capsule (40 mg total) by mouth every morning. 09/07/14  Yes Fayrene Helper, PA-C  SUBOXONE 8-2 MG FILM Place 1 Film under the tongue 2 (two) times daily.  10/31/16  Yes [provider]  topiramate (TOPAMAX) 50 MG tablet Take 50 mg by mouth 2 (two) times daily.   Yes [provider]  traZODone (DESYREL) 150 MG tablet Take 150 mg by mouth at bedtime.    Yes [provider]  zolpidem (AMBIEN) 10 MG tablet Take 10 mg by mouth at bedtime.   Yes [provider]  doxycycline (VIBRAMYCIN) 100 MG capsule Take 1 capsule (100 mg total) by mouth 2 (two) times daily. Patient not taking: Reported on 11/08/2016 10/31/16   Cathren Laine, MD  feeding supplement, ENSURE ENLIVE, (ENSURE ENLIVE) LIQD Take 237 mLs by mouth 2 (two) times daily between meals. Patient not taking: Reported on 11/08/2016 09/12/14   Rama, Maryruth Bun, MD  folic acid (FOLVITE) 1 MG tablet Take 1 tablet (1 mg total) by mouth daily. Patient not taking: Reported on 11/08/2016 09/12/14   Rama,  Maryruth Bun, MD  Multiple Vitamin (MULTIVITAMIN WITH MINERALS) TABS tablet Take 1 tablet by mouth daily. Patient not taking: Reported on 11/08/2016 09/12/14   Rama, Maryruth Bun, MD  oxyCODONE (OXY IR/ROXICODONE) 5 MG immediate release tablet Take 1-2 tablets (5-10 mg total) by mouth every 4 (four) hours as needed for moderate pain. Patient not taking: Reported on 11/08/2016 09/12/14   Rama, Maryruth Bun, MD  predniSONE (DELTASONE) 20 MG tablet Take 2 tablets (40 mg total) by mouth daily. Patient not taking: Reported on 11/08/2016 10/31/16   Cathren Laine, MD  thiamine 100 MG tablet Take 1 tablet (100 mg total) by mouth daily. Patient not taking: Reported on 11/08/2016 09/12/14   Rama, Maryruth Bun, MD  traMADol (ULTRAM) 50 MG tablet Take 1 tablet (50 mg total) by mouth every 8 (eight) hours as needed. Patient not taking: Reported on 11/08/2016 09/17/14   Jaclyn Shaggy, MD  Physical Exam: Vitals:   11/08/16 1354 11/08/16 1436 11/08/16 1515 11/08/16 1530  BP: 98/73  117/87 123/88  Pulse: (!) 105  94 98  Resp: 14  14 19   Temp:  97.7 F (36.5 C)    TempSrc:  Oral    SpO2: 100%  94% 98%  Weight:      Height:          Constitutional: NAD, calm, comfortable Eyes: PERRL, lids and conjunctivae normal ENMT: Mucous membranes are moist. Posterior pharynx clear of any exudate or lesions.Normal dentition.  Neck: normal, supple, no masses, no thyromegaly Respiratory: clear to auscultation bilaterally, no wheezing, no crackles. Normal respiratory effort. No accessory muscle use.  Cardiovascular: Regular rate and rhythm, no murmurs / rubs / gallops. No extremity edema. 2+ pedal pulses. No carotid bruits.  Abdomen: no tenderness, no masses palpated. No hepatosplenomegaly. Bowel sounds positive. Abdomen appears slightly distended although patient denies. Musculoskeletal: no clubbing / cyanosis. No joint deformity upper and lower extremities. Good ROM, no contractures. Normal muscle tone.  Skin: no rashes,  lesions, ulcers. No induration Neurologic: CN 2-12 grossly intact. Sensation intact, DTR normal. Strength 5/5 x all 4 extremities.  Psychiatric: Normal judgment and insight. Alert and oriented x 3. Normal mood.    Labs on Admission: I have personally reviewed following labs and imaging studies  CBC:  Recent Labs Lab 11/08/16 1122  WBC 12.5*  HGB 14.9  HCT 47.4  MCV 90.1  PLT 491*   Basic Metabolic Panel:  Recent Labs Lab 11/08/16 1122  NA 139  K 3.9  CL 111  CO2 16*  GLUCOSE 129*  BUN 76*  CREATININE 2.18*  CALCIUM 10.2   GFR: Estimated Creatinine Clearance: 29.8 mL/min (A) (by C-G formula based on SCr of 2.18 mg/dL (H)). Liver Function Tests: No results for input(s): AST, ALT, ALKPHOS, BILITOT, PROT, ALBUMIN in the last 168 hours. No results for input(s): LIPASE, AMYLASE in the last 168 hours. No results for input(s): AMMONIA in the last 168 hours. Coagulation Profile: No results for input(s): INR, PROTIME in the last 168 hours. Cardiac Enzymes: No results for input(s): CKTOTAL, CKMB, CKMBINDEX, TROPONINI in the last 168 hours. BNP (last 3 results) No results for input(s): PROBNP in the last 8760 hours. HbA1C: No results for input(s): HGBA1C in the last 72 hours. CBG: No results for input(s): GLUCAP in the last 168 hours. Lipid Profile: No results for input(s): CHOL, HDL, LDLCALC, TRIG, CHOLHDL, LDLDIRECT in the last 72 hours. Thyroid Function Tests: No results for input(s): TSH, T4TOTAL, FREET4, T3FREE, THYROIDAB in the last 72 hours. Anemia Panel: No results for input(s): VITAMINB12, FOLATE, FERRITIN, TIBC, IRON, RETICCTPCT in the last 72 hours. Urine analysis:    Component Value Date/Time   COLORURINE ORANGE (A) 11/05/2015 2230   APPEARANCEUR CLOUDY (A) 11/05/2015 2230   LABSPEC 1.039 (H) 11/05/2015 2230   PHURINE 5.5 11/05/2015 2230   GLUCOSEU NEGATIVE 11/05/2015 2230   HGBUR NEGATIVE 11/05/2015 2230   BILIRUBINUR SMALL (A) 11/05/2015 2230    KETONESUR NEGATIVE 11/05/2015 2230   PROTEINUR 30 (A) 11/05/2015 2230   UROBILINOGEN 1.0 09/09/2014 2330   NITRITE NEGATIVE 11/05/2015 2230   LEUKOCYTESUR SMALL (A) 11/05/2015 2230   Sepsis Labs: @LABRCNTIP (procalcitonin:4,lacticidven:4) )No results found for this or any previous visit (from the past 240 hour(s)).   Radiological Exams on Admission: Dg Chest 2 View  Result Date: 11/08/2016 CLINICAL DATA:  Shortness of breath for 2 weeks. History of COPD and hypertension. Former smoker EXAM: CHEST  2  VIEW COMPARISON:  Radiographs 10/31/2016.  CT 03/17/2014. FINDINGS: The heart size and mediastinal contours are stable. The lungs are hyperinflated with stable blunting of both costophrenic angles. No evidence of airspace disease, edema, pleural effusion or pneumothorax. Retrosternal nodular density on the lateral view is unchanged, pleural based on CT. Stable mild thoracic spine degenerative changes. IMPRESSION: Stable chronic findings consistent with chronic obstructive pulmonary disease. No acute cardiopulmonary process. Electronically Signed   By: Carey Bullocks M.D.   On: 11/08/2016 15:04    EKG: (Independently reviewed) Sinus tachycardia with ventricular rate 120 bpm, QTC 500 ms, no acute ischemic changes, normal R-wave rotation,  Assessment/Plan Principal Problem:   Acute kidney injury  -Patient presents with shortness of breath and headache with no diagnostic findings to explain symptoms other than metabolic acidemia in setting of acute kidney injury -Multifactorial etiology: Prerenal secondary to dehydration and ATN secondary to ACE inhibitor as well as possible degree of low renal perfusion from suspected transient hypotension (did not check orthostatic vital signs since patient has already been given 1 L of fluids in the ER) -Renal ultrasound -Hold offending medications -Continue gentle IV fluid hydration -Follow labs -See below -Baseline renal function: 15 and 0.87 -Current renal  function: 76 and 2.18  Active Problems:   BPH (benign prostatic hyperplasia) -Documented on previous admission in 2016 -Urinary retention could be contributing to acute kidney injury so check bladder scan    Hypertension -Current blood pressure readings suboptimal -In setting of dehydration and acute kidney injury will hold antihypertensive agents (clonidine, Norvasc and lisinopril)    COPD (chronic obstructive pulmonary disease)  -Currently stable without wheezing or hypoxemia -Patient reports stopped cigarette smoking 2 weeks ago -Continue MDI    GERD (gastroesophageal reflux disease) -Continue PPI    Polysubstance dependence  -And 2016 apparently was using heroin-patient denies illicit drugs at this juncture -Check urine drug screen and alcohol level      DVT prophylaxis: Lovenox Code Status: Full Family Communication: No family or friends at bedside Disposition Plan: Home Consults called: None    Kaleia Longhi L. ANP-BC [facility] Pager [phone]   If 7PM-7AM, please contact night-coverage [email or web address] Password [email or web address]  11/08/2016, 4:09 PM

## 2016-11-08 NOTE — ED Notes (Signed)
Got patient into a gown on the monitor warm blanket patient is resting waiting on provider

## 2016-11-08 NOTE — ED Triage Notes (Signed)
Pt states that he has been having SOB and headaches for 2 weeks. Pt states that he was seen last week and diagnosed with COPD. Pt states that he has continued to have decreased appetite as well.

## 2016-11-08 NOTE — ED Notes (Signed)
Patient transported to X-ray 

## 2016-11-08 NOTE — Progress Notes (Signed)
Admission note:  Arrival Method: via stretcher accompanied by RN Mental Orientation: A&Ox4 Telemetry: box 10, CCMD notified. Assessment: see doc flow sheet. Skin: warm, dry and intact. IV: R hand NSL. Pain: denies Safety Measures: discussed with pt, verbalized understanding.  Fall Prevention Safety Plan: discussed and reviewed with pt.  Admission Screening: in progress.  6700 Orientation: Patient has been oriented to the unit, staff and to the room. Call light and telephone within reach, will continue to monitor.

## 2016-11-09 DIAGNOSIS — F319 Bipolar disorder, unspecified: Secondary | ICD-10-CM | POA: Diagnosis present

## 2016-11-09 DIAGNOSIS — R630 Anorexia: Secondary | ICD-10-CM | POA: Diagnosis present

## 2016-11-09 DIAGNOSIS — N179 Acute kidney failure, unspecified: Secondary | ICD-10-CM

## 2016-11-09 DIAGNOSIS — Z79899 Other long term (current) drug therapy: Secondary | ICD-10-CM | POA: Diagnosis not present

## 2016-11-09 DIAGNOSIS — E872 Acidosis: Secondary | ICD-10-CM | POA: Diagnosis present

## 2016-11-09 DIAGNOSIS — R338 Other retention of urine: Secondary | ICD-10-CM | POA: Diagnosis present

## 2016-11-09 DIAGNOSIS — Z87891 Personal history of nicotine dependence: Secondary | ICD-10-CM | POA: Diagnosis not present

## 2016-11-09 DIAGNOSIS — G4700 Insomnia, unspecified: Secondary | ICD-10-CM | POA: Diagnosis present

## 2016-11-09 DIAGNOSIS — R51 Headache: Secondary | ICD-10-CM

## 2016-11-09 DIAGNOSIS — N170 Acute kidney failure with tubular necrosis: Secondary | ICD-10-CM | POA: Diagnosis present

## 2016-11-09 DIAGNOSIS — M47814 Spondylosis without myelopathy or radiculopathy, thoracic region: Secondary | ICD-10-CM | POA: Diagnosis present

## 2016-11-09 DIAGNOSIS — R519 Headache, unspecified: Secondary | ICD-10-CM

## 2016-11-09 DIAGNOSIS — I10 Essential (primary) hypertension: Secondary | ICD-10-CM | POA: Diagnosis present

## 2016-11-09 DIAGNOSIS — T464X5A Adverse effect of angiotensin-converting-enzyme inhibitors, initial encounter: Secondary | ICD-10-CM | POA: Diagnosis present

## 2016-11-09 DIAGNOSIS — F1911 Other psychoactive substance abuse, in remission: Secondary | ICD-10-CM | POA: Diagnosis present

## 2016-11-09 DIAGNOSIS — J449 Chronic obstructive pulmonary disease, unspecified: Secondary | ICD-10-CM | POA: Diagnosis present

## 2016-11-09 DIAGNOSIS — Z8711 Personal history of peptic ulcer disease: Secondary | ICD-10-CM | POA: Diagnosis not present

## 2016-11-09 DIAGNOSIS — N401 Enlarged prostate with lower urinary tract symptoms: Secondary | ICD-10-CM | POA: Diagnosis present

## 2016-11-09 DIAGNOSIS — Z6822 Body mass index (BMI) 22.0-22.9, adult: Secondary | ICD-10-CM | POA: Diagnosis not present

## 2016-11-09 DIAGNOSIS — K219 Gastro-esophageal reflux disease without esophagitis: Secondary | ICD-10-CM | POA: Diagnosis present

## 2016-11-09 DIAGNOSIS — E860 Dehydration: Secondary | ICD-10-CM | POA: Diagnosis present

## 2016-11-09 LAB — CBC
HCT: 40.3 % (ref 39.0–52.0)
Hemoglobin: 12.4 g/dL — ABNORMAL LOW (ref 13.0–17.0)
MCH: 27.7 pg (ref 26.0–34.0)
MCHC: 30.8 g/dL (ref 30.0–36.0)
MCV: 90.2 fL (ref 78.0–100.0)
PLATELETS: 376 10*3/uL (ref 150–400)
RBC: 4.47 MIL/uL (ref 4.22–5.81)
RDW: 16.1 % — ABNORMAL HIGH (ref 11.5–15.5)
WBC: 9.3 10*3/uL (ref 4.0–10.5)

## 2016-11-09 LAB — COMPREHENSIVE METABOLIC PANEL
ALK PHOS: 65 U/L (ref 38–126)
ALT: 21 U/L (ref 17–63)
ANION GAP: 8 (ref 5–15)
AST: 25 U/L (ref 15–41)
Albumin: 3 g/dL — ABNORMAL LOW (ref 3.5–5.0)
BUN: 59 mg/dL — ABNORMAL HIGH (ref 6–20)
CALCIUM: 9.2 mg/dL (ref 8.9–10.3)
CO2: 21 mmol/L — AB (ref 22–32)
CREATININE: 1.32 mg/dL — AB (ref 0.61–1.24)
Chloride: 114 mmol/L — ABNORMAL HIGH (ref 101–111)
GFR calc Af Amer: >60 mL/min (ref >60)
GFR, EST NON AFRICAN AMERICAN: 54 mL/min — AB (ref >60)
Glucose, Bld: 97 mg/dL (ref 65–99)
Potassium: 3.6 mmol/L (ref 3.5–5.1)
SODIUM: 143 mmol/L (ref 135–145)
TOTAL PROTEIN: 6.3 g/dL — AB (ref 6.5–8.1)
Total Bilirubin: 0.7 mg/dL (ref 0.3–1.2)

## 2016-11-09 LAB — MRSA PCR SCREENING: MRSA by PCR: POSITIVE — AB

## 2016-11-09 MED ORDER — MUPIROCIN 2 % EX OINT
1.0000 "application " | TOPICAL_OINTMENT | Freq: Two times a day (BID) | CUTANEOUS | Status: DC
  Administered 2016-11-09 – 2016-11-11 (×5): 1 via NASAL
  Filled 2016-11-09 (×2): qty 22

## 2016-11-09 MED ORDER — BUPRENORPHINE HCL-NALOXONE HCL 8-2 MG SL FILM: 1.0000 | ORAL_FILM | Freq: Two times a day (BID) | SUBLINGUAL | Status: DC

## 2016-11-09 MED ORDER — ENOXAPARIN SODIUM 40 MG/0.4ML ~~LOC~~ SOLN
40.0000 mg | SUBCUTANEOUS | Status: DC
  Administered 2016-11-09 – 2016-11-10 (×2): 40 mg via SUBCUTANEOUS
  Filled 2016-11-09 (×2): qty 0.4

## 2016-11-09 MED ORDER — CHLORHEXIDINE GLUCONATE CLOTH 2 % EX PADS
6.0000 | MEDICATED_PAD | Freq: Every day | CUTANEOUS | Status: DC
  Administered 2016-11-09 – 2016-11-11 (×3): 6 via TOPICAL

## 2016-11-09 MED ORDER — BUPRENORPHINE HCL-NALOXONE HCL 8-2 MG SL SUBL
1.0000 | SUBLINGUAL_TABLET | Freq: Two times a day (BID) | SUBLINGUAL | Status: DC
  Administered 2016-11-09 – 2016-11-11 (×4): 1 via SUBLINGUAL
  Filled 2016-11-09 (×4): qty 1

## 2016-11-09 NOTE — Progress Notes (Signed)
PROGRESS NOTE    [name]  GNF:[id number] DOB: [birth year] DOA: 11/08/2016 PCP: Gilda CreasePavelock, Richard M, MD  Outpatient Specialists:     Brief Narrative:  2768 Prior polysubstance abuse-currently on Suboxone replacement COPD BPH Reflux HTN  Recent admission from ED 6/11 short of breath given doxycycline  Readmitted 6/20 short of breath headache and found to have EOMI and 76, creatinine 2.1   Assessment & Plan:   Principal Problem:   Acute kidney injury (HCC) Active Problems:   AKI (acute kidney injury) (HCC)   Hypertension   COPD (chronic obstructive pulmonary disease) (HCC)   GERD (gastroesophageal reflux disease)   BPH (benign prostatic hyperplasia)   Polysubstance dependence (HCC)   Frontal headache   AK I  Continue IV fluids, holding clonidine Norvasc lisinopril  Repeat labs a.m.  Saline 100 cc per hour  Polysubstance abuse  Continue methadone 8 mg twice a day as per home regimen  Counseled on cocaine, amphetamine and the fact that these might have contributed to his AK I  Stable COPD stage B  Continue inhalers  Unlikely will do repeat quit smoking   Reflux  Continue PPI  Bipolar/insomnia  Continue trazodone 150 at bedtime, zolpidem 10 mg    Lovenox  Full code Inpatient No family    Subjective: Awake alert pleasant   headaches have decreased   feeling overall better Passing good urine   Objective: Vitals:   11/08/16 1817 11/08/16 2138 11/09/16 0615 11/09/16 1014  BP: (!) 145/82 136/76 140/78 (!) 143/81  Pulse: 90 95 84 92  Resp: 18 18 18 18   Temp: 97.8 F (36.6 C) 97.8 F (36.6 C) 98.5 F (36.9 C) 98.4 F (36.9 C)  TempSrc: Oral Oral Oral Oral  SpO2: 98% 99% 97% 98%  Weight: 74.6 kg (164 lb 7.4 oz) 73.6 kg (162 lb 4.1 oz)    Height: 6' (1.829 m)       Intake/Output Summary (Last 24 hours) at 11/09/16 1452 Last data filed at 11/09/16 0900  Gross per 24 hour  Intake          1426.67 ml  Output              225 ml  Net           1201.67 ml   Filed Weights   11/08/16 1124 11/08/16 1817 11/08/16 2138  Weight: 64.9 kg (143 lb) 74.6 kg (164 lb 7.4 oz) 73.6 kg (162 lb 4.1 oz)    Examination:  General exam: Appears calm and comfortable  Respiratory system: Clear to auscultation. Respiratory effort normal. Cardiovascular system: S1 & S2 heard, RRR. No JVD, murmurs, rubs, gallops or clicks. No pedal edema. Gastrointestinal system: Abdomen is nondistended, soft and nontender. No organomegaly or masses felt. Normal bowel sounds heard. Central nervous system: Alert and oriented. No focal neurological deficits. Extremities: Symmetric 5 x 5 power. Skin: No rashes, lesions or ulcers Psychiatry: Judgement and insight appear normal. Mood & affect appropriate.     Data Reviewed: I have personally reviewed following labs and imaging studies  CBC:  Recent Labs Lab 11/08/16 1122 11/09/16 0418  WBC 12.5* 9.3  HGB 14.9 12.4*  HCT 47.4 40.3  MCV 90.1 90.2  PLT 491* 376   Basic Metabolic Panel:  Recent Labs Lab 11/08/16 1122 11/08/16 1940 11/09/16 0418  NA 139  --  143  K 3.9  --  3.6  CL 111  --  114*  CO2 16*  --  21*  GLUCOSE 129*  --  97  BUN 76*  --  59*  CREATININE 2.18*  --  1.32*  CALCIUM 10.2  --  9.2  MG  --  2.2  --   PHOS  --  3.0  --    GFR: Estimated Creatinine Clearance: 55.8 mL/min (A) (by C-G formula based on SCr of 1.32 mg/dL (H)). Liver Function Tests:  Recent Labs Lab 11/09/16 0418  AST 25  ALT 21  ALKPHOS 65  BILITOT 0.7  PROT 6.3*  ALBUMIN 3.0*   No results for input(s): LIPASE, AMYLASE in the last 168 hours. No results for input(s): AMMONIA in the last 168 hours. Coagulation Profile: No results for input(s): INR, PROTIME in the last 168 hours. Cardiac Enzymes: No results for input(s): CKTOTAL, CKMB, CKMBINDEX, TROPONINI in the last 168 hours. BNP (last 3 results) No results for input(s): PROBNP in the last 8760 hours. HbA1C: No results for input(s): HGBA1C in  the last 72 hours. CBG: No results for input(s): GLUCAP in the last 168 hours. Lipid Profile: No results for input(s): CHOL, HDL, LDLCALC, TRIG, CHOLHDL, LDLDIRECT in the last 72 hours. Thyroid Function Tests: No results for input(s): TSH, T4TOTAL, FREET4, T3FREE, THYROIDAB in the last 72 hours. Anemia Panel: No results for input(s): VITAMINB12, FOLATE, FERRITIN, TIBC, IRON, RETICCTPCT in the last 72 hours. Urine analysis:    Component Value Date/Time   COLORURINE YELLOW 11/08/2016 2002   APPEARANCEUR CLEAR 11/08/2016 2002   LABSPEC 1.024 11/08/2016 2002   PHURINE 5.0 11/08/2016 2002   GLUCOSEU NEGATIVE 11/08/2016 2002   HGBUR NEGATIVE 11/08/2016 2002   BILIRUBINUR NEGATIVE 11/08/2016 2002   KETONESUR 5 (A) 11/08/2016 2002   PROTEINUR NEGATIVE 11/08/2016 2002   UROBILINOGEN 1.0 09/09/2014 2330   NITRITE NEGATIVE 11/08/2016 2002   LEUKOCYTESUR LARGE (A) 11/08/2016 2002   Sepsis Labs: @LABRCNTIP (procalcitonin:4,lacticidven:4)  ) Recent Results (from the past 240 hour(s))  MRSA PCR Screening     Status: Abnormal   Collection Time: 11/08/16  8:16 PM  Result Value Ref Range Status   MRSA by PCR POSITIVE (A) NEGATIVE Final    Comment:        The GeneXpert MRSA Assay (FDA approved for NASAL specimens only), is one component of a comprehensive MRSA colonization surveillance program. It is not intended to diagnose MRSA infection nor to guide or monitor treatment for MRSA infections. RESULT CALLED TO, READ BACK BY AND VERIFIED WITH: M.CASTRO,RN AT 2359 BY L.PITT 11/08/16          Radiology Studies: Dg Chest 2 View  Result Date: 11/08/2016 CLINICAL DATA:  Shortness of breath for 2 weeks. History of COPD and hypertension. Former smoker EXAM: CHEST  2 VIEW COMPARISON:  Radiographs 10/31/2016.  CT 03/17/2014. FINDINGS: The heart size and mediastinal contours are stable. The lungs are hyperinflated with stable blunting of both costophrenic angles. No evidence of airspace  disease, edema, pleural effusion or pneumothorax. Retrosternal nodular density on the lateral view is unchanged, pleural based on CT. Stable mild thoracic spine degenerative changes. IMPRESSION: Stable chronic findings consistent with chronic obstructive pulmonary disease. No acute cardiopulmonary process. Electronically Signed   By: Carey Bullocks M.D.   On: 11/08/2016 15:04   US Renal  Result Date: 11/08/2016 CLINICAL DATA:  Acute kidney injury. EXAM: RENAL / URINARY TRACT ULTRASOUND COMPLETE COMPARISON:  CT 09/07/2014 FINDINGS: Right Kidney: Length: 9.4 cm. Echogenicity within normal limits. Mild thinning of the renal parenchyma. No mass or hydronephrosis visualized. Left Kidney: Length: 12.1 cm. Echogenicity within normal limits. Small subcentimeter cyst  in the interpolar kidney laterally. No solid mass or hydronephrosis visualized. Bladder: Appears normal for degree of bladder distention. The right ureteral jet is visualized. IMPRESSION: Small left renal cyst. Otherwise unremarkable renal ultrasound. No obstructive uropathy. Electronically Signed   By: Rubye Oaks M.D.   On: 11/08/2016 23:59        Scheduled Meds: . Chlorhexidine Gluconate Cloth  6 each Topical Q0600  . enoxaparin (LOVENOX) injection  40 mg Subcutaneous Q24H  . mupirocin ointment  1 application Nasal BID  . pantoprazole  40 mg Oral Daily  . sodium chloride flush  3 mL Intravenous Q12H  . topiramate  50 mg Oral BID  . traZODone  150 mg Oral QHS  . zolpidem  10 mg Oral QHS   Continuous Infusions: . sodium chloride 100 mL/hr at 11/09/16 0528     LOS: 0 days    Time spent: 46    Pleas Koch, MD [facility]   If 7PM-7AM, please contact night-coverage [email or web address] Password [email or web address] 11/09/2016, 2:52 PM

## 2016-11-10 MED ORDER — FAMOTIDINE 20 MG PO TABS
20.0000 mg | ORAL_TABLET | Freq: Every day | ORAL | Status: DC
  Administered 2016-11-10 – 2016-11-11 (×2): 20 mg via ORAL
  Filled 2016-11-10 (×2): qty 1

## 2016-11-10 MED ORDER — SUCRALFATE 1 G PO TABS
1.0000 g | ORAL_TABLET | Freq: Three times a day (TID) | ORAL | Status: DC
  Administered 2016-11-10 – 2016-11-11 (×4): 1 g via ORAL
  Filled 2016-11-10 (×4): qty 1

## 2016-11-10 MED ORDER — POLYETHYLENE GLYCOL 3350 17 G PO PACK
17.0000 g | PACK | Freq: Every day | ORAL | Status: DC
  Administered 2016-11-10 – 2016-11-11 (×2): 17 g via ORAL
  Filled 2016-11-10 (×2): qty 1

## 2016-11-10 NOTE — Progress Notes (Signed)
PROGRESS NOTE    [name]  UJW:[id number] DOB: 08/17/1945 DOA: 11/08/2016 PCP: [name], MD  Outpatient Specialists:     Brief Narrative:   70 Prior polysubstance abuse-currently on Suboxone replacement COPD BPH Reflux HTN  Recent admission from ED 6/11 short of breath given doxycycline  Readmitted 6/20 short of breath headache and found to have EOMI and 76, creatinine 2.1   Assessment & Plan:   Principal Problem:   Acute kidney injury (HCC) Active Problems:   AKI (acute kidney injury) (HCC)   Hypertension   COPD (chronic obstructive pulmonary disease) (HCC)   GERD (gastroesophageal reflux disease)   BPH (benign prostatic hyperplasia)   Polysubstance dependence (HCC)   Frontal headache   AK I  Continue IV fluids, holding clonidine Norvasc lisinopril  Repeat labs a.m.  Saline 100 cc per hour-->50 cc/h  Polysubstance abuse  Continue methadone 8 mg twice a day as per home regimen  Counseled on cocaine, amphetamine and the fact that these might have contributed to his AK I  Stable COPD stage B  Continue inhalers  Unlikely will do repeat quit smoking   Reflux  abdominal pain and anorexia  Continue PPI  Patient having some malaise this morning unresponsive to Pepcid 20  Added Carafate 1 g 4 times a day to see if this helps  Will get 2 view abdominal film in a.m., patient is on Suboxone replacement therapy which may be causing him to have some constipation, adding MiraLAX  Bipolar/insomnia  Continue trazodone 150 at bedtime, zolpidem 10 mg    Lovenox  Full code Inpatient No family    Subjective:  Some discomfort in belly, no CP No nauseous, no vomit Not eating and did not feel as well today as he did yesterday but has no real explanation  Denies food sticking in his throat Denies difficulty with various textures of food Just not hungry  Objective: Vitals:   11/09/16 2233 11/10/16 0630 11/10/16 0752 11/10/16 1755  BP: (!)  168/91 (!) 151/75 (!) 151/96 (!) 156/57  Pulse: 83 80 89 80  Resp: 18 18 18 18   Temp: 98.3 F (36.8 C) 98.7 F (37.1 C) 98.7 F (37.1 C) 98.6 F (37 C)  TempSrc: Oral Oral Oral Oral  SpO2: 99% 99% 99% 98%  Weight: 78.6 kg (173 lb 4.5 oz)     Height:        Intake/Output Summary (Last 24 hours) at 11/10/16 1825 Last data filed at 11/10/16 1756  Gross per 24 hour  Intake             2905 ml  Output             2200 ml  Net              705 ml   Filed Weights   11/08/16 1817 11/08/16 2138 11/09/16 2233  Weight: 74.6 kg (164 lb 7.4 oz) 73.6 kg (162 lb 4.1 oz) 78.6 kg (173 lb 4.5 oz)    Examination:  Awake alert flat affect EOMI NCAT S1-S2 no murmur Abdomen slight distention slight tenderness lower quadrants no epigastric tenderness No lower extremity edema Skin is soft supple No rash Moving all 4 limbs reasonably well     Data Reviewed: I have personally  following labs and imaging studies  CBC:  Recent Labs Lab 11/08/16 1122 11/09/16 0418  WBC 12.5* 9.3  HGB 14.9 12.4*  HCT 47.4 40.3  MCV 90.1 90.2  PLT 491* 376   Basic  Metabolic Panel:  Recent Labs Lab 11/08/16 1122 11/08/16 1940 11/09/16 0418  NA 139  --  143  K 3.9  --  3.6  CL 111  --  114*  CO2 16*  --  21*  GLUCOSE 129*  --  97  BUN 76*  --  59*  CREATININE 2.18*  --  1.32*  CALCIUM 10.2  --  9.2  MG  --  2.2  --   PHOS  --  3.0  --    GFR: Estimated Creatinine Clearance: 58.8 mL/min (A) (by C-G formula based on SCr of 1.32 mg/dL (H)). Liver Function Tests:  Recent Labs Lab 11/09/16 0418  AST 25  ALT 21  ALKPHOS 65  BILITOT 0.7  PROT 6.3*  ALBUMIN 3.0*   No results for input(s): LIPASE, AMYLASE in the last 168 hours. No results for input(s): AMMONIA in the last 168 hours. Coagulation Profile: No results for input(s): INR, PROTIME in the last 168 hours. Cardiac Enzymes: No results for input(s): CKTOTAL, CKMB, CKMBINDEX, TROPONINI in the last 168 hours. BNP (last 3  results) No results for input(s): PROBNP in the last 8760 hours. HbA1C: No results for input(s): HGBA1C in the last 72 hours. CBG: No results for input(s): GLUCAP in the last 168 hours. Lipid Profile: No results for input(s): CHOL, HDL, LDLCALC, TRIG, CHOLHDL, LDLDIRECT in the last 72 hours. Thyroid Function Tests: No results for input(s): TSH, T4TOTAL, FREET4, T3FREE, THYROIDAB in the last 72 hours. Anemia Panel: No results for input(s): VITAMINB12, FOLATE, FERRITIN, TIBC, IRON, RETICCTPCT in the last 72 hours. Urine analysis:    Component Value Date/Time   COLORURINE YELLOW 11/08/2016 2002   APPEARANCEUR CLEAR 11/08/2016 2002   LABSPEC 1.024 11/08/2016 2002   PHURINE 5.0 11/08/2016 2002   GLUCOSEU NEGATIVE 11/08/2016 2002   HGBUR NEGATIVE 11/08/2016 2002   BILIRUBINUR NEGATIVE 11/08/2016 2002   KETONESUR 5 (A) 11/08/2016 2002   PROTEINUR NEGATIVE 11/08/2016 2002   UROBILINOGEN 1.0 09/09/2014 2330   NITRITE NEGATIVE 11/08/2016 2002   LEUKOCYTESUR LARGE (A) 11/08/2016 2002   Sepsis Labs: @LABRCNTIP (procalcitonin:4,lacticidven:4)  ) Recent Results (from the past 240 hour(s))  MRSA PCR Screening     Status: Abnormal   Collection Time: 11/08/16  8:16 PM  Result Value Ref Range Status   MRSA by PCR POSITIVE (A) NEGATIVE Final    Comment:        The GeneXpert MRSA Assay (FDA approved for NASAL specimens only), is one component of a comprehensive MRSA colonization surveillance program. It is not intended to diagnose MRSA infection nor to guide or monitor treatment for MRSA infections. RESULT CALLED TO, READ BACK BY AND VERIFIED WITH: M.CASTRO,RN AT 2359 BY L.PITT 11/08/16          Radiology Studies: [place] Renal  Result Date: 11/08/2016 CLINICAL DATA:  Acute kidney injury. EXAM: RENAL / URINARY TRACT ULTRASOUND COMPLETE COMPARISON:  CT 09/07/2014 FINDINGS: Right Kidney: Length: 9.4 cm. Echogenicity within normal limits. Mild thinning of the renal parenchyma. No mass  or hydronephrosis visualized. Left Kidney: Length: 12.1 cm. Echogenicity within normal limits. Small subcentimeter cyst in the interpolar kidney laterally. No solid mass or hydronephrosis visualized. Bladder: Appears normal for degree of bladder distention. The right ureteral jet is visualized. IMPRESSION: Small left renal cyst. Otherwise unremarkable renal ultrasound. No obstructive uropathy. Electronically Signed   By: Rubye OaksMelanie  Ehinger M.D.   On: 11/08/2016 23:59        Scheduled Meds: . buprenorphine-naloxone  1 tablet Sublingual BID  .  Chlorhexidine Gluconate Cloth  6 each Topical Q0600  . enoxaparin (LOVENOX) injection  40 mg Subcutaneous Q24H  . famotidine  20 mg Oral Daily  . mupirocin ointment  1 application Nasal BID  . pantoprazole  40 mg Oral Daily  . sodium chloride flush  3 mL Intravenous Q12H  . sucralfate  1 g Oral TID WC & HS  . topiramate  50 mg Oral BID  . traZODone  150 mg Oral QHS  . zolpidem  10 mg Oral QHS   Continuous Infusions: . sodium chloride 100 mL/hr at 11/10/16 1008     LOS: 1 day    Time spent: 25    Pleas Koch, MD [facility]   If 7PM-7AM, please contact night-coverage [email or web address] Password [facility] 11/10/2016, 6:25 PM

## 2016-11-10 NOTE — Progress Notes (Signed)
Nutrition Brief Note  Patient identified on the Malnutrition Screening Tool (MST) Report  Wt Readings from Last 15 Encounters:  11/09/16 173 lb 4.5 oz (78.6 kg)  10/31/16 143 lb (64.9 kg)  09/17/14 146 lb (66.2 kg)  09/10/14 144 lb 13.5 oz (65.7 kg)  03/17/14 154 lb (69.9 kg)  03/10/14 154 lb (69.9 kg)  07/13/07 160 lb 6.4 oz (72.8 kg)  05/23/07 152 lb 9.6 oz (69.2 kg)  04/12/07 162 lb 8 oz (73.7 kg)  04/05/07 159 lb 1.6 oz (72.2 kg)    Body mass index is 23.5 kg/m.   Current diet order is Regular, patient is consuming approximately 100% of meals at this time. Labs and medications reviewed.   No nutrition interventions warranted at this time. If nutrition issues arise, please consult RD.   Romelle Starcherate Telesha Deguzman MS, RD, LDN [phone] Pager  [phone] Weekend/On-Call Pager

## 2016-11-11 ENCOUNTER — Inpatient Hospital Stay (HOSPITAL_COMMUNITY): Payer: Medicare Other

## 2016-11-11 LAB — COMPREHENSIVE METABOLIC PANEL
ALBUMIN: 3 g/dL — AB (ref 3.5–5.0)
ALT: 20 U/L (ref 17–63)
AST: 24 U/L (ref 15–41)
Alkaline Phosphatase: 60 U/L (ref 38–126)
Anion gap: 8 (ref 5–15)
BUN: 15 mg/dL (ref 6–20)
CHLORIDE: 111 mmol/L (ref 101–111)
CO2: 20 mmol/L — ABNORMAL LOW (ref 22–32)
CREATININE: 0.82 mg/dL (ref 0.61–1.24)
Calcium: 8.9 mg/dL (ref 8.9–10.3)
GFR calc Af Amer: >60 mL/min (ref >60)
GLUCOSE: 95 mg/dL (ref 65–99)
POTASSIUM: 3.4 mmol/L — AB (ref 3.5–5.1)
Sodium: 139 mmol/L (ref 135–145)
Total Bilirubin: 0.6 mg/dL (ref 0.3–1.2)
Total Protein: 6.6 g/dL (ref 6.5–8.1)

## 2016-11-11 MED ORDER — SUCRALFATE 1 G PO TABS: 1.0000 g | ORAL_TABLET | Freq: Three times a day (TID) | ORAL | 0 refills | Status: DC

## 2016-11-11 MED ORDER — AMLODIPINE BESYLATE 10 MG PO TABS: 10.0000 mg | ORAL_TABLET | Freq: Every day | ORAL | 0 refills | Status: DC

## 2016-11-11 MED ORDER — POLYETHYLENE GLYCOL 3350 17 G PO PACK: 17.0000 g | PACK | Freq: Every day | ORAL | 0 refills | Status: AC

## 2016-11-11 MED ORDER — FAMOTIDINE 20 MG PO TABS: 20.0000 mg | ORAL_TABLET | Freq: Two times a day (BID) | ORAL | 0 refills | Status: DC

## 2016-11-11 NOTE — Progress Notes (Addendum)
Discharge instructions, medications and follow up appointments discussed and reviewed with pt, verbalized understanding. IV dc'ed, catheter intact, site clean and dry. Pt is dressed and ready, waiting for his brother to take him home. Pt was escorted out of the unit in wheelchair, took all belongings with him.

## 2016-11-11 NOTE — Discharge Summary (Signed)
Physician Discharge Summary  [name] GEX:[id number] DOB: 1946/01/07 DOA: 11/08/2016  PCP: [name], MD  Admit date: 11/08/2016 Discharge date: 11/11/2016  Time spent: 35 minutes  Recommendations for Outpatient Follow-up:  1. Discontinue Lotrel in favor of amlodipine 10 2. Needs basic metabolic panel in 1 week, needs blood pressure trends as an out 3. Evaluate for nausea and abdominal pain and may need GI follow-up-placed on Carafate this admission 4.   Discharge Diagnoses:  Principal Problem:   Acute kidney injury (HCC) Active Problems:   AKI (acute kidney injury) (HCC)   Hypertension   COPD (chronic obstructive pulmonary disease) (HCC)   GERD (gastroesophageal reflux disease)   BPH (benign prostatic hyperplasia)   Polysubstance dependence (HCC)   Frontal headache   Discharge Condition: Good  S 2 S 2 Diet recommendation: Heart healthy low-salt  Filed Weights   11/08/16 2138 11/09/16 2233 11/10/16 1948  Weight: 73.6 kg (162 lb 4.1 oz) 78.6 kg (173 lb 4.5 oz) 75.6 kg (166 lb 11.2 oz)    History of present illness:  70 Prior polysubstance abuse-currently on Suboxone replacement COPD BPH Reflux HTN  Recent admission from ED 6/11 short of breath given doxycycline  Readmitted 6/20 short of breath headache and found to have EOMI and 76, creatinine 2.1   Hospital Course:   AKI             Continue IV fluids, holding clonidine Norvasc lisinopril             Repeat labs a.m.             Saline 100 cc per hour-->50 cc/h, and saline locked on discharge  Polysubstance abuse             Continue methadone 8 mg twice a day as per home regimen             Counseled on cocaine, amphetamine and the fact that these might have contributed to his AK I  It is unlikely patient will quit  Stable COPD stage B             Continue inhalers             Unlikely will do repeat quit smoking     Reflux  abdominal pain and anorexia             Continue  PPI             Patient having some malaise this morning unresponsive to Pepcid 20             Added Carafate 1 g 4 times which seems to help              2 view x-ray 6/22 showed no issues and patient eating that time discharged with a other complaint  Bipolar/insomnia             Continue trazodone 150 at bedtime, zolpidem 10    Consultations:  None   Discharge Exam: Vitals:   11/11/16 0437 11/11/16 0924  BP: (!) 150/99 (!) 142/78  Pulse: (!) 108 (!) 106  Resp: 20 18  Temp: 98.1 F (36.7 C) 98.7 F (37.1 C)    General: Alert and oriented no apparent distress Cardiovascular: S1-S2 no murmur rub or gallop F  Respiratory: Clinically clear no added sound Abdomen soft nontender nondistended no burning  Discharge Instructions   Discharge Instructions    Diet - low sodium heart healthy    Complete by:  As  directed    Discharge instructions    Complete by:  As directed    Do not use the combination blood pressure medication that you have been prescribed, instead I prescribed a new medication that you should start Also you should take carafate for your stomach You will need lab work in 3 days at your regular physician's office and subsequently you will need a 10-14 day follow-up there to discuss your numerous issues You will not be discharged with any controlled substances-please follow-up with your pain management physician for the same Try to drink plenty of fluids and water over the course of next week at least 2 L a day   Increase activity slowly    Complete by:  As directed      Current Discharge Medication List    START taking these medications   Details  amLODipine (NORVASC) 10 MG tablet Take 1 tablet (10 mg total) by mouth daily. Qty: 30 tablet, Refills: 0    famotidine (PEPCID) 20 MG tablet Take 1 tablet (20 mg total) by mouth 2 (two) times daily. Qty: 60 tablet, Refills: 0    polyethylene glycol (MIRALAX / GLYCOLAX) packet Take 17 g by mouth daily. Qty: 14  each, Refills: 0    sucralfate (CARAFATE) 1 g tablet Take 1 tablet (1 g total) by mouth 4 (four) times daily -  with meals and at bedtime. Qty: 30 tablet, Refills: 0      CONTINUE these medications which have NOT CHANGED   Details  acetaminophen (TYLENOL) 325 MG tablet Take 2 tablets (650 mg total) by mouth every 6 (six) hours as needed for mild pain (or Fever >/= 101).    albuterol (PROVENTIL HFA;VENTOLIN HFA) 108 (90 Base) MCG/ACT inhaler Inhale 2 puffs into the lungs every 4 (four) hours as needed for wheezing or shortness of breath. Qty: 1 Inhaler, Refills: 3    cloNIDine (CATAPRES) 0.1 MG tablet Take 1 tablet (0.1 mg total) by mouth 2 (two) times daily. Qty: 60 tablet, Refills: 11    omeprazole (PRILOSEC) 40 MG capsule Take 1 capsule (40 mg total) by mouth every morning. Qty: 30 capsule, Refills: 0    SUBOXONE 8-2 MG FILM Place 1 Film under the tongue 2 (two) times daily.  Refills: 0    topiramate (TOPAMAX) 50 MG tablet Take 50 mg by mouth 2 (two) times daily.    traZODone (DESYREL) 150 MG tablet Take 150 mg by mouth at bedtime.     zolpidem (AMBIEN) 10 MG tablet Take 10 mg by mouth at bedtime.    feeding supplement, ENSURE ENLIVE, (ENSURE ENLIVE) LIQD Take 237 mLs by mouth 2 (two) times daily between meals. Qty: 237 mL, Refills: 12    folic acid (FOLVITE) 1 MG tablet Take 1 tablet (1 mg total) by mouth daily. Qty: 30 tablet, Refills: 0    Multiple Vitamin (MULTIVITAMIN WITH MINERALS) TABS tablet Take 1 tablet by mouth daily.    oxyCODONE (OXY IR/ROXICODONE) 5 MG immediate release tablet Take 1-2 tablets (5-10 mg total) by mouth every 4 (four) hours as needed for moderate pain. Qty: 15 tablet, Refills: 0    thiamine 100 MG tablet Take 1 tablet (100 mg total) by mouth daily. Qty: 30 tablet, Refills: 2      STOP taking these medications     amLODipine-benazepril (LOTREL) 10-40 MG per capsule      doxycycline (VIBRAMYCIN) 100 MG capsule      predniSONE (DELTASONE)  20 MG tablet  traMADol (ULTRAM) 50 MG tablet        No Known Allergies    The results of significant diagnostics from this hospitalization (including imaging, microbiology, ancillary and laboratory) are listed below for reference.    Significant Diagnostic Studies: Dg Chest 2 View  Result Date: 11/08/2016 CLINICAL DATA:  Shortness of breath for 2 weeks. History of COPD and hypertension. Former smoker EXAM: CHEST  2 VIEW COMPARISON:  Radiographs 10/31/2016.  CT 03/17/2014. FINDINGS: The heart size and mediastinal contours are stable. The lungs are hyperinflated with stable blunting of both costophrenic angles. No evidence of airspace disease, edema, pleural effusion or pneumothorax. Retrosternal nodular density on the lateral view is unchanged, pleural based on CT. Stable mild thoracic spine degenerative changes. IMPRESSION: Stable chronic findings consistent with chronic obstructive pulmonary disease. No acute cardiopulmonary process. Electronically Signed   By: Carey BullocksWilliam  Veazey M.D.   On: 11/08/2016 15:04   Dg Chest 2 View  Result Date: 10/31/2016 CLINICAL DATA:  Severe headache and shortness of breath for 2 weeks. EXAM: CHEST  2 VIEW COMPARISON:  Chest CT 03/17/2014 and chest x-ray 03/10/2014 FINDINGS: The cardiac silhouette, mediastinal and hilar contours are within normal limits and stable. Mild tortuosity and calcification of the thoracic aorta. Stable emphysematous changes and pulmonary scarring. No infiltrates, edema or effusions. Stable nodular density in the right mid lung since 2015. No new lesions. The bony thorax is intact. IMPRESSION: Chronic lung changes.  No acute overlying pulmonary process. Electronically Signed   By: Rudie MeyerP.  Gallerani M.D.   On: 10/31/2016 12:43   Ct Head Wo Contrast  Result Date: 10/31/2016 CLINICAL DATA:  Frontal headache for 2 weeks.  Blurred vision. EXAM: CT HEAD WITHOUT CONTRAST TECHNIQUE: Contiguous axial images were obtained from the base of the skull  through the vertex without intravenous contrast. COMPARISON:  CT scan of November 05, 2015. FINDINGS: Brain: Mild chronic ischemic white matter disease is noted. Old lacunar infarctions are noted in the basal ganglia bilaterally. No mass effect or midline shift is noted. Ventricular size is within normal limits. There is no evidence of mass lesion, hemorrhage or acute infarction. Vascular: Atherosclerosis of carotid siphons is noted. Skull: Normal. Negative for fracture or focal lesion. Sinuses/Orbits: No acute finding. Other: None. IMPRESSION: Mild chronic ischemic white matter disease. 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 No acute intracranial abnormality seen. Electronically Signed   By: Lupita RaiderJames  Green Jr, M.D.   On: 10/31/2016 13:32   [place] Renal  Result Date: 11/08/2016 CLINICAL DATA:  Acute kidney injury. EXAM: RENAL / URINARY TRACT ULTRASOUND COMPLETE COMPARISON:  CT 09/07/2014 FINDINGS: Right Kidney: Length: 9.4 cm. Echogenicity within normal limits. Mild thinning of the renal parenchyma. No mass or hydronephrosis visualized. Left Kidney: Length: 12.1 cm. Echogenicity within normal limits. Small subcentimeter cyst in the interpolar kidney laterally. No solid mass or hydronephrosis visualized. Bladder: Appears normal for degree of bladder distention. The right ureteral jet is visualized. IMPRESSION: Small left renal cyst. Otherwise unremarkable renal ultrasound. No obstructive uropathy. Electronically Signed   By: Rubye OaksMelanie  Ehinger M.D.   On: 11/08/2016 23:59   Dg Abd Portable 2v  Result Date: 11/11/2016 CLINICAL DATA:  Abdominal pain. EXAM: PORTABLE ABDOMEN - 2 VIEW COMPARISON:  Ultrasound 11/08/2016.  CT 09/07/2014 . FINDINGS: Soft tissue structures are unremarkable. No bowel distention. No free air. Aortoiliac and renal atherosclerotic vascular calcification. Pelvic calcifications noted consistent phleboliths, distal ureteral stones cannot be completely excluded. Degenerative changes  lumbar spine and both hips. IMPRESSION: 1.  No  acute abnormality. 2. Aortoiliac and renal atherosclerotic vascular disease. Electronically Signed   By: Maisie Fus  Register   On: 11/11/2016 07:23    Microbiology: Recent Results (from the past 240 hour(s))  MRSA PCR Screening     Status: Abnormal   Collection Time: 11/08/16  8:16 PM  Result Value Ref Range Status   MRSA by PCR POSITIVE (A) NEGATIVE Final    Comment:        The GeneXpert MRSA Assay (FDA approved for NASAL specimens only), is one component of a comprehensive MRSA colonization surveillance program. It is not intended to diagnose MRSA infection nor to guide or monitor treatment for MRSA infections. RESULT CALLED TO, READ BACK BY AND VERIFIED WITH: M.CASTRO,RN AT 2359 BY L.PITT 11/08/16      Labs: Basic Metabolic Panel:  Recent Labs Lab 11/08/16 1122 11/08/16 1940 11/09/16 0418 11/11/16 0608  NA 139  --  143 139  K 3.9  --  3.6 3.4*  CL 111  --  114* 111  CO2 16*  --  21* 20*  GLUCOSE 129*  --  97 95  BUN 76*  --  59* 15  CREATININE 2.18*  --  1.32* 0.82  CALCIUM 10.2  --  9.2 8.9  MG  --  2.2  --   --   PHOS  --  3.0  --   --    Liver Function Tests:  Recent Labs Lab 11/09/16 0418 11/11/16 0608  AST 25 24  ALT 21 20  ALKPHOS 65 60  BILITOT 0.7 0.6  PROT 6.3* 6.6  ALBUMIN 3.0* 3.0*   No results for input(s): LIPASE, AMYLASE in the last 168 hours. No results for input(s): AMMONIA in the last 168 hours. CBC:  Recent Labs Lab 11/08/16 1122 11/09/16 0418  WBC 12.5* 9.3  HGB 14.9 12.4*  HCT 47.4 40.3  MCV 90.1 90.2  PLT 491* 376   Cardiac Enzymes: No results for input(s): CKTOTAL, CKMB, CKMBINDEX, TROPONINI in the last 168 hours. BNP: BNP (last 3 results) No results for input(s): BNP in the last 8760 hours.  ProBNP (last 3 results) No results for input(s): PROBNP in the last 8760 hours.  CBG: No results for input(s): GLUCAP in the last 168 hours.     SignedRhetta Mura MD   [facility] 11/11/2016, 1:18 PM

## 2018-05-02 ENCOUNTER — Encounter: Payer: Self-pay | Admitting: Diagnostic Neuroimaging

## 2018-05-02 ENCOUNTER — Ambulatory Visit (INDEPENDENT_AMBULATORY_CARE_PROVIDER_SITE_OTHER): Payer: Medicare Other | Admitting: Diagnostic Neuroimaging

## 2018-05-02 ENCOUNTER — Telehealth: Payer: Self-pay | Admitting: Diagnostic Neuroimaging

## 2018-05-02 DIAGNOSIS — G4489 Other headache syndrome: Secondary | ICD-10-CM

## 2018-05-02 MED ORDER — TOPIRAMATE 50 MG PO TABS: 50.0000 mg | ORAL_TABLET | Freq: Two times a day (BID) | ORAL | 12 refills | Status: DC

## 2018-05-02 NOTE — Telephone Encounter (Signed)
Medicare/medicaid order sent to [facility]. No auth they will reach out to the pt to schedule.  °

## 2018-05-02 NOTE — Telephone Encounter (Signed)
i tried calling his number and it would not let me leave a vmail

## 2018-05-02 NOTE — Progress Notes (Signed)
[facility]  PATIENT: [name] DOB: 1945/06/09  REFERRING CLINICIAN: D Odem, FNP HISTORY FROM: patient  REASON FOR VISIT: new consult    HISTORICAL  CHIEF COMPLAINT:  Chief Complaint  Patient presents with  . Headache    rm 7, New Pt, "headaches for a long time but more intense, I wake up with them, I take Goody Powders up to 2 x day with good relief; Tylenol is not as effective"    HISTORY OF PRESENT ILLNESS:   72 year old male with hypertension, here for evaluation of headaches.  In past 6 months patient is having intermittent daily headaches lasting 30 to 60 minutes, with pounding severe pain in the eyes and middle of the head.  No nausea or vomiting.  No sensitive to light or sound.  Sometimes his right jaw hurts with the headaches.  Patient also has had milder headaches since his 30s.  These are nonspecific he does not remember them.  No specific triggering or aggravating factors.  Patient takes Gabriel Earing powder with some relief.  Tylenol does not help.    REVIEW OF SYSTEMS: Full 14 system review of systems performed and negative with exception of: Joint pain shortness of breath achy muscles.  ALLERGIES: No Known Allergies  HOME MEDICATIONS: Outpatient Medications Prior to Visit  Medication Sig Dispense Refill  . acetaminophen (TYLENOL) 325 MG tablet Take 2 tablets (650 mg total) by mouth every 6 (six) hours as needed for mild pain (or Fever >/= 101).    Marland Kitchen albuterol (PROVENTIL HFA;VENTOLIN HFA) 108 (90 Base) MCG/ACT inhaler Inhale 2 puffs into the lungs every 4 (four) hours as needed for wheezing or shortness of breath. 1 Inhaler 3  . amLODipine-benazepril (LOTREL) 10-40 MG capsule Take by mouth daily.  0  . Aspirin-Acetaminophen-Caffeine (GOODY HEADACHE PO) Take by mouth.    . cloNIDine (CATAPRES) 0.1 MG tablet Take 1 tablet (0.1 mg total) by mouth 2 (two) times daily. (Patient taking differently: Take 0.1-0.2 mg by mouth See admin  instructions. 0.1 mg in the morning and 0.2 mg at bedtime) 60 tablet 11  . omeprazole (PRILOSEC) 40 MG capsule Take 1 capsule (40 mg total) by mouth every morning. 30 capsule 0  . SYMBICORT 160-4.5 MCG/ACT inhaler     . traZODone (DESYREL) 150 MG tablet Take 150 mg by mouth at bedtime.     Marland Kitchen zolpidem (AMBIEN) 10 MG tablet Take 10 mg by mouth at bedtime.    Marland Kitchen amLODipine (NORVASC) 10 MG tablet Take 1 tablet (10 mg total) by mouth daily. 30 tablet 0  . folic acid (FOLVITE) 1 MG tablet Take 1 tablet (1 mg total) by mouth daily. (Patient not taking: Reported on 11/08/2016) 30 tablet 0  . Multiple Vitamin (MULTIVITAMIN WITH MINERALS) TABS tablet Take 1 tablet by mouth daily. (Patient not taking: Reported on 11/08/2016)    . polyethylene glycol (MIRALAX / GLYCOLAX) packet Take 17 g by mouth daily. (Patient not taking: Reported on 05/02/2018) 14 each 0  . SUBOXONE 8-2 MG FILM Place 1 Film under the tongue 2 (two) times daily.   0  . thiamine 100 MG tablet Take 1 tablet (100 mg total) by mouth daily. (Patient not taking: Reported on 11/08/2016) 30 tablet 2  . topiramate (TOPAMAX) 50 MG tablet Take 50 mg by mouth 2 (two) times daily.    . famotidine (PEPCID) 20 MG tablet Take 1 tablet (20 mg total) by mouth 2 (two) times daily. 60 tablet 0  . feeding supplement, ENSURE ENLIVE, (  ENSURE ENLIVE) LIQD Take 237 mLs by mouth 2 (two) times daily between meals. (Patient not taking: Reported on 11/08/2016) 237 mL 12  . oxyCODONE (OXY IR/ROXICODONE) 5 MG immediate release tablet Take 1-2 tablets (5-10 mg total) by mouth every 4 (four) hours as needed for moderate pain. (Patient not taking: Reported on 11/08/2016) 15 tablet 0  . sucralfate (CARAFATE) 1 g tablet Take 1 tablet (1 g total) by mouth 4 (four) times daily -  with meals and at bedtime. 30 tablet 0   No facility-administered medications prior to visit.     PAST MEDICAL HISTORY: Past Medical History:  Diagnosis Date  . Arthritis    "arms, elbows" (11/08/2016)    . Asthma   . COPD (chronic obstructive pulmonary disease) ([place])   . CVA (cerebral vascular accident) ([place]) 12/2004   and hemorrhagic likely secondary to very uncontrolled hypertension /notes 10/06/2010  . GERD (gastroesophageal reflux disease)   . High cholesterol   . Hypertension   . Migraine    "just started having them in the last couple weeks; qod" (11/08/2016)  . Peptic ulcer disease   . Pneumonia 02/2007    PAST SURGICAL HISTORY: Past Surgical History:  Procedure Laterality Date  . MASS EXCISION Left 01/2003   of the occipital scalp and soft tissue mass of the left face/notes 10/06/2010  . TONSILLECTOMY  1950s  . TRANSURETHRAL RESECTION OF PROSTATE N/A 03/17/2014   Procedure: TRANSURETHRAL RESECTION OF THE PROSTATE (TURP) WITH GYRUS;  Surgeon: Claybon Jabs, MD;  Location: [facility];  Service: Urology;  Laterality: N/A;  . VIDEO ASSISTED THORACOSCOPY (VATS)/EMPYEMA Right 02/2007    drainage of empyema with decortication /notes 09/22/2010    FAMILY HISTORY: Family History  Problem Relation Age of Onset  . Cirrhosis Mother   . Cancer Brother     SOCIAL HISTORY: Social History   Socioeconomic History  . Marital status: Divorced    Spouse name: Not on file  . Number of children: 3  . Years of education: 56  . Highest education level: Not on file  Occupational History    Comment: retired  Scientific laboratory technician  . Financial resource strain: Not on file  . Food insecurity:    Worry: Not on file    Inability: Not on file  . Transportation needs:    Medical: Not on file    Non-medical: Not on file  Tobacco Use  . Smoking status: Former Smoker    Packs/day: 0.50    Years: 40.00    Pack years: 20.00    Types: Cigarettes    Last attempt to quit: 10/21/2016    Years since quitting: 1.5  . Smokeless tobacco: Never Used  . Tobacco comment: 05/02/18 5 cigs daily  Substance and Sexual Activity  . Alcohol use: Not Currently    Alcohol/week: 0.0 standard drinks    Comment:  11/08/2016 "nothing in the last year"  . 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 Sexual activity: Not Currently  Lifestyle  . Physical activity:    Days per week: Not on file    Minutes per session: Not on file  . Stress: Not on file  Relationships  . Social connections:    Talks on phone: Not on file    Gets together: Not on file    Attends religious service: Not on file    Active member of club or organization: Not on file  Attends meetings of clubs or organizations: Not on file    Relationship status: Not on file  . Intimate partner violence:    Fear of current or ex partner: Not on file    Emotionally abused: Not on file    Physically abused: Not on file    Forced sexual activity: Not on file  Other Topics Concern  . Not on file  Social History Narrative   Lives with his brother   Caffeine- coffee, 1 cup daily     PHYSICAL EXAM  GENERAL EXAM/CONSTITUTIONAL: Vitals:  Vitals:   05/02/18 0818  BP: (!) 164/86  Pulse: (!) 113  Weight: 178 lb 3.2 oz (80.8 kg)  Height: 6' (1.829 m)     Body mass index is 24.17 kg/m. Wt Readings from Last 3 Encounters:  05/02/18 178 lb 3.2 oz (80.8 kg)  11/10/16 166 lb 11.2 oz (75.6 kg)  10/31/16 143 lb (64.9 kg)     Patient is in no distress; well developed, nourished and groomed; neck is supple  CARDIOVASCULAR:  Examination of carotid arteries is normal; no carotid bruits  Regular rate and rhythm, no murmurs  Examination of peripheral vascular system by observation and palpation is normal  EYES:  Ophthalmoscopic exam of optic discs and posterior segments is normal; no papilledema or hemorrhages  Visual Acuity Screening   Right eye Left eye Both eyes  Without correction:     With correction: 20/70 20/70      MUSCULOSKELETAL:  Gait, strength, tone, movements noted in Neurologic exam below  NEUROLOGIC: MENTAL STATUS:  No flowsheet data found.  awake,  alert, oriented to person, place and time  recent and remote memory intact  normal attention and concentration  language fluent, comprehension intact, naming intact  fund of knowledge appropriate  CRANIAL NERVE:   2nd - no papilledema on fundoscopic exam  2nd, 3rd, 4th, 6th - pupils equal and reactive to light, visual fields full to confrontation, extraocular muscles intact, no nystagmus  5th - facial sensation symmetric  7th - facial strength symmetric  8th - hearing intact  9th - palate elevates symmetrically, uvula midline  11th - shoulder shrug symmetric  12th - tongue protrusion midline  MOTOR:   normal bulk and tone, full strength in the BUE, BLE; EXCEPT HIP FLEXION WEAKNESS (3-4); LIMITED BY PAIN  SENSORY:   normal and symmetric to light touch, temperature, vibration; EXCEPT DECR IN RIGHT HAND TO TEMP  COORDINATION:   finger-nose-finger, fine finger movements --> MILD DYSMETRIA  REFLEXES:   deep tendon reflexes TRACE and symmetric  GAIT/STATION:   ANTALGIC GAIT; SLOW TO RISE; CAREFUL AND SLOW     DIAGNOSTIC DATA (LABS, IMAGING, TESTING) - I reviewed patient records, labs, notes, testing and imaging myself where available.  Lab Results  Component Value Date   WBC 9.3 11/09/2016   HGB 12.4 (L) 11/09/2016   HCT 40.3 11/09/2016   MCV 90.2 11/09/2016   PLT 376 11/09/2016      Component Value Date/Time   NA 139 11/11/2016 0608   K 3.4 (L) 11/11/2016 0608   CL 111 11/11/2016 0608   CO2 20 (L) 11/11/2016 0608   GLUCOSE 95 11/11/2016 0608   BUN 15 11/11/2016 0608   CREATININE 0.82 11/11/2016 0608   CREATININE 0.65 09/17/2014 1235   CALCIUM 8.9 11/11/2016 0608   PROT 6.6 11/11/2016 0608   ALBUMIN 3.0 (L) 11/11/2016 0608   AST 24 11/11/2016 0608   ALT 20 11/11/2016 0608   ALKPHOS 60 11/11/2016  8329   BILITOT 0.6 11/11/2016 0608   GFRNONAA >60 11/11/2016 0608   GFRAA >60 11/11/2016 0608   Lab Results  Component Value Date   CHOL 122  09/11/2014   HDL 34 (L) 09/11/2014   LDLCALC 66 09/11/2014   TRIG 108 09/11/2014   CHOLHDL 3.6 09/11/2014   No results found for: HGBA1C Lab Results  Component Value Date   VBTYOMAY04 599 03/16/2007   No results found for: TSH  03/17/14 CT chest - Centrilobular emphysema. - Peripheral areas of scarring within the right lung and lung bases. - 6 mm subpleural or pleural-based nodule left upper lobe anteriorly. Given risk factors for bronchogenic carcinoma, follow-up chest CT at 6 - 12 months is recommended. This recommendation follows the consensus statement: Guidelines for Management of Small Pulmonary Nodules Detected on CT Scans: A Statement from the [place] as published in Radiology 2005;237:395-400. - Bilateral adrenal gland nodularity without a dominant/measurable nodule, similar appearance to prior. - Posterior gastric wall thickening is nonspecific by CT and may be accentuated by incomplete distention. Can be further evaluated with EGD.  10/31/16 CT head [I reviewed images myself and agree with interpretation. -VRP]  - Mild chronic ischemic white matter disease. 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 No acute intracranial abnormality seen.    ASSESSMENT AND PLAN  72 y.o. year old male here with history of nonspecific headache since age 70 years old, with new type of severe headache in the past 6 months with right jaw pain, throbbing sensation, 30 to 60 minutes at a time.  We will proceed with further work-up.   Ddx: primary HA (migraine) vs secondary HA (temporal arteritis, mass, inflamm, vascular)  1. Other headache syndrome     PLAN:  - MRI brain w/wo - ESR, CRP - start topiramate '50mg'$  at bedtime; after 1 week increase to twice a day; drink plenty of water - ibuprofen / tylenol as needed for headache  Orders Placed This Encounter  Procedures  . MR BRAIN W WO CONTRAST  . Sedimentation Rate  . C-reactive Protein   Meds ordered  this encounter  Medications  . topiramate (TOPAMAX) 50 MG tablet    Sig: Take 1 tablet (50 mg total) by mouth 2 (two) times daily.    Dispense:  60 tablet    Refill:  12   Return in about 3 months (around 08/01/2018).    Penni Bombard, MD 77/41/4239, 5:32 AM Certified in Neurology, Neurophysiology and Neuroimaging  [facility] [address], [place], [place] [address] [phone]

## 2018-05-02 NOTE — Patient Instructions (Signed)
-  MRI brain w/wo - ESR, CRP - start topiramate '50mg'$  at bedtime; after 1 week increase to twice a day; drink plenty of water - ibuprofen / tylenol as needed for headache

## 2018-05-04 ENCOUNTER — Encounter: Payer: Self-pay | Admitting: Diagnostic Neuroimaging

## 2018-05-20 ENCOUNTER — Other Ambulatory Visit: Payer: Medicare Other

## 2018-06-01 DIAGNOSIS — G4489 Other headache syndrome: Secondary | ICD-10-CM

## 2018-06-02 ENCOUNTER — Ambulatory Visit
Admission: RE | Admit: 2018-06-02 | Discharge: 2018-06-02 | Disposition: A | Discharge status: Home / Self Care | Payer: Medicare Other | Source: Ambulatory Visit | Attending: Diagnostic Neuroimaging | Admitting: Diagnostic Neuroimaging

## 2018-06-02 DIAGNOSIS — G4489 Other headache syndrome: Secondary | ICD-10-CM

## 2018-06-02 MED ORDER — GADOBENATE DIMEGLUMINE 529 MG/ML IV SOLN
15.0000 mL | Freq: Once | INTRAVENOUS | Status: AC | PRN
  Administered 2018-06-02: 15 mL via INTRAVENOUS

## 2018-06-11 ENCOUNTER — Telehealth: Payer: Self-pay | Admitting: *Deleted

## 2018-06-11 NOTE — Telephone Encounter (Signed)
Notes recorded by Suanne Marker, MD on 06/06/2018 at 1:05 PM EST chronic small vessel ischemic disease. 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 Recommend daily aspirin 81mg . Stop smoking. BP control per PCP. -VRP.  I called pt and relayed results of MRI to pt.  He verbalized understanding and will start on aspirin 81mg  daily.  Instructed recommendations of stop smoking, control Bp per pcp.  His pcp is Zachery Dauer,  NP.

## 2018-06-11 NOTE — Telephone Encounter (Signed)
Notes recorded by Suanne Marker, MD on 06/06/2018 at 1:05 PM EST chronic small vessel ischemic disease. 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 Recommend daily aspirin 81mg . Stop smoking. BP control per PCP. -VRP.  I called pt and relayed results of MRI to pt.  He verbalized understanding and will start on aspirin 81mg  daily.  Instructed recommendations of stop smoking, control Bp per pcp.  His pcp is Zachery Dauer,  NP/ Dr. Midge Aver [phone].

## 2018-08-01 ENCOUNTER — Telehealth: Payer: Self-pay | Admitting: *Deleted

## 2018-08-01 ENCOUNTER — Ambulatory Visit: Payer: Medicare Other | Admitting: Diagnostic Neuroimaging

## 2018-08-01 NOTE — Telephone Encounter (Signed)
Patient was no show for follow up today. 

## 2018-08-02 ENCOUNTER — Encounter: Payer: Self-pay | Admitting: Diagnostic Neuroimaging

## 2019-05-11 ENCOUNTER — Other Ambulatory Visit: Payer: Self-pay | Admitting: Diagnostic Neuroimaging

## 2020-01-04 ENCOUNTER — Inpatient Hospital Stay (HOSPITAL_COMMUNITY): Payer: Medicare Other

## 2020-01-04 ENCOUNTER — Emergency Department (HOSPITAL_COMMUNITY): Payer: Medicare Other

## 2020-01-04 ENCOUNTER — Encounter (HOSPITAL_COMMUNITY): Payer: Self-pay | Admitting: Emergency Medicine

## 2020-01-04 ENCOUNTER — Inpatient Hospital Stay (HOSPITAL_COMMUNITY)
Admission: EM | Admit: 2020-01-04 | Discharge: 2020-01-17 | DRG: 368 | Disposition: A | Discharge status: Skilled Nursing Facility | Payer: Medicare Other | Attending: Family Medicine | Admitting: Family Medicine

## 2020-01-04 ENCOUNTER — Other Ambulatory Visit: Payer: Self-pay

## 2020-01-04 DIAGNOSIS — K209 Esophagitis, unspecified without bleeding: Secondary | ICD-10-CM

## 2020-01-04 DIAGNOSIS — E872 Acidosis: Secondary | ICD-10-CM | POA: Diagnosis not present

## 2020-01-04 DIAGNOSIS — K828 Other specified diseases of gallbladder: Secondary | ICD-10-CM | POA: Diagnosis not present

## 2020-01-04 DIAGNOSIS — K760 Fatty (change of) liver, not elsewhere classified: Secondary | ICD-10-CM | POA: Diagnosis not present

## 2020-01-04 DIAGNOSIS — I10 Essential (primary) hypertension: Secondary | ICD-10-CM | POA: Diagnosis present

## 2020-01-04 DIAGNOSIS — N401 Enlarged prostate with lower urinary tract symptoms: Secondary | ICD-10-CM | POA: Diagnosis present

## 2020-01-04 DIAGNOSIS — K921 Melena: Secondary | ICD-10-CM

## 2020-01-04 DIAGNOSIS — E870 Hyperosmolality and hypernatremia: Secondary | ICD-10-CM | POA: Diagnosis not present

## 2020-01-04 DIAGNOSIS — R569 Unspecified convulsions: Secondary | ICD-10-CM | POA: Diagnosis not present

## 2020-01-04 DIAGNOSIS — E871 Hypo-osmolality and hyponatremia: Secondary | ICD-10-CM | POA: Diagnosis present

## 2020-01-04 DIAGNOSIS — R338 Other retention of urine: Secondary | ICD-10-CM | POA: Diagnosis not present

## 2020-01-04 DIAGNOSIS — G40909 Epilepsy, unspecified, not intractable, without status epilepticus: Secondary | ICD-10-CM | POA: Diagnosis present

## 2020-01-04 DIAGNOSIS — Z9079 Acquired absence of other genital organ(s): Secondary | ICD-10-CM

## 2020-01-04 DIAGNOSIS — F1120 Opioid dependence, uncomplicated: Secondary | ICD-10-CM | POA: Diagnosis present

## 2020-01-04 DIAGNOSIS — R1084 Generalized abdominal pain: Secondary | ICD-10-CM | POA: Diagnosis not present

## 2020-01-04 DIAGNOSIS — R6881 Early satiety: Secondary | ICD-10-CM | POA: Diagnosis not present

## 2020-01-04 DIAGNOSIS — R7881 Bacteremia: Secondary | ICD-10-CM | POA: Diagnosis not present

## 2020-01-04 DIAGNOSIS — Z20822 Contact with and (suspected) exposure to covid-19: Secondary | ICD-10-CM | POA: Diagnosis not present

## 2020-01-04 DIAGNOSIS — E860 Dehydration: Secondary | ICD-10-CM | POA: Diagnosis present

## 2020-01-04 DIAGNOSIS — K2970 Gastritis, unspecified, without bleeding: Secondary | ICD-10-CM | POA: Diagnosis present

## 2020-01-04 DIAGNOSIS — Z7401 Bed confinement status: Secondary | ICD-10-CM | POA: Diagnosis not present

## 2020-01-04 DIAGNOSIS — N179 Acute kidney failure, unspecified: Secondary | ICD-10-CM | POA: Diagnosis present

## 2020-01-04 DIAGNOSIS — K319 Disease of stomach and duodenum, unspecified: Secondary | ICD-10-CM | POA: Diagnosis present

## 2020-01-04 DIAGNOSIS — R7989 Other specified abnormal findings of blood chemistry: Secondary | ICD-10-CM

## 2020-01-04 DIAGNOSIS — R4586 Emotional lability: Secondary | ICD-10-CM | POA: Diagnosis not present

## 2020-01-04 DIAGNOSIS — D62 Acute posthemorrhagic anemia: Secondary | ICD-10-CM | POA: Diagnosis present

## 2020-01-04 DIAGNOSIS — Z7951 Long term (current) use of inhaled steroids: Secondary | ICD-10-CM

## 2020-01-04 DIAGNOSIS — Z8673 Personal history of transient ischemic attack (TIA), and cerebral infarction without residual deficits: Secondary | ICD-10-CM

## 2020-01-04 DIAGNOSIS — K269 Duodenal ulcer, unspecified as acute or chronic, without hemorrhage or perforation: Secondary | ICD-10-CM | POA: Diagnosis not present

## 2020-01-04 DIAGNOSIS — D72829 Elevated white blood cell count, unspecified: Secondary | ICD-10-CM | POA: Diagnosis not present

## 2020-01-04 DIAGNOSIS — Z22322 Carrier or suspected carrier of Methicillin resistant Staphylococcus aureus: Secondary | ICD-10-CM

## 2020-01-04 DIAGNOSIS — E876 Hypokalemia: Secondary | ICD-10-CM

## 2020-01-04 DIAGNOSIS — E440 Moderate protein-calorie malnutrition: Secondary | ICD-10-CM | POA: Insufficient documentation

## 2020-01-04 DIAGNOSIS — R630 Anorexia: Secondary | ICD-10-CM | POA: Diagnosis not present

## 2020-01-04 DIAGNOSIS — Z7982 Long term (current) use of aspirin: Secondary | ICD-10-CM

## 2020-01-04 DIAGNOSIS — K5730 Diverticulosis of large intestine without perforation or abscess without bleeding: Secondary | ICD-10-CM | POA: Diagnosis not present

## 2020-01-04 DIAGNOSIS — R1310 Dysphagia, unspecified: Secondary | ICD-10-CM | POA: Diagnosis not present

## 2020-01-04 DIAGNOSIS — I700 Atherosclerosis of aorta: Secondary | ICD-10-CM | POA: Diagnosis not present

## 2020-01-04 DIAGNOSIS — M2550 Pain in unspecified joint: Secondary | ICD-10-CM | POA: Diagnosis not present

## 2020-01-04 DIAGNOSIS — K920 Hematemesis: Secondary | ICD-10-CM | POA: Diagnosis not present

## 2020-01-04 DIAGNOSIS — K922 Gastrointestinal hemorrhage, unspecified: Secondary | ICD-10-CM

## 2020-01-04 DIAGNOSIS — K2980 Duodenitis without bleeding: Secondary | ICD-10-CM | POA: Diagnosis not present

## 2020-01-04 DIAGNOSIS — K449 Diaphragmatic hernia without obstruction or gangrene: Secondary | ICD-10-CM | POA: Diagnosis not present

## 2020-01-04 DIAGNOSIS — R6889 Other general symptoms and signs: Secondary | ICD-10-CM | POA: Diagnosis not present

## 2020-01-04 DIAGNOSIS — J449 Chronic obstructive pulmonary disease, unspecified: Secondary | ICD-10-CM | POA: Diagnosis present

## 2020-01-04 DIAGNOSIS — F1410 Cocaine abuse, uncomplicated: Secondary | ICD-10-CM | POA: Diagnosis present

## 2020-01-04 DIAGNOSIS — D509 Iron deficiency anemia, unspecified: Secondary | ICD-10-CM | POA: Diagnosis not present

## 2020-01-04 DIAGNOSIS — R0902 Hypoxemia: Secondary | ICD-10-CM | POA: Diagnosis not present

## 2020-01-04 DIAGNOSIS — E785 Hyperlipidemia, unspecified: Secondary | ICD-10-CM | POA: Diagnosis present

## 2020-01-04 DIAGNOSIS — R160 Hepatomegaly, not elsewhere classified: Secondary | ICD-10-CM | POA: Diagnosis present

## 2020-01-04 DIAGNOSIS — I959 Hypotension, unspecified: Secondary | ICD-10-CM

## 2020-01-04 DIAGNOSIS — R109 Unspecified abdominal pain: Secondary | ICD-10-CM

## 2020-01-04 DIAGNOSIS — J90 Pleural effusion, not elsewhere classified: Secondary | ICD-10-CM | POA: Diagnosis not present

## 2020-01-04 DIAGNOSIS — Z6821 Body mass index (BMI) 21.0-21.9, adult: Secondary | ICD-10-CM

## 2020-01-04 DIAGNOSIS — K3189 Other diseases of stomach and duodenum: Secondary | ICD-10-CM | POA: Diagnosis not present

## 2020-01-04 DIAGNOSIS — N323 Diverticulum of bladder: Secondary | ICD-10-CM | POA: Diagnosis not present

## 2020-01-04 DIAGNOSIS — R52 Pain, unspecified: Secondary | ICD-10-CM | POA: Diagnosis not present

## 2020-01-04 DIAGNOSIS — Z791 Long term (current) use of non-steroidal anti-inflammatories (NSAID): Secondary | ICD-10-CM

## 2020-01-04 DIAGNOSIS — F319 Bipolar disorder, unspecified: Secondary | ICD-10-CM | POA: Diagnosis present

## 2020-01-04 DIAGNOSIS — Z79899 Other long term (current) drug therapy: Secondary | ICD-10-CM

## 2020-01-04 DIAGNOSIS — Z743 Need for continuous supervision: Secondary | ICD-10-CM | POA: Diagnosis not present

## 2020-01-04 DIAGNOSIS — R0602 Shortness of breath: Secondary | ICD-10-CM

## 2020-01-04 DIAGNOSIS — K264 Chronic or unspecified duodenal ulcer with hemorrhage: Secondary | ICD-10-CM | POA: Diagnosis present

## 2020-01-04 DIAGNOSIS — R58 Hemorrhage, not elsewhere classified: Secondary | ICD-10-CM | POA: Diagnosis not present

## 2020-01-04 DIAGNOSIS — K2981 Duodenitis with bleeding: Secondary | ICD-10-CM | POA: Diagnosis not present

## 2020-01-04 DIAGNOSIS — K7460 Unspecified cirrhosis of liver: Secondary | ICD-10-CM | POA: Diagnosis not present

## 2020-01-04 DIAGNOSIS — R519 Headache, unspecified: Secondary | ICD-10-CM | POA: Diagnosis not present

## 2020-01-04 DIAGNOSIS — E46 Unspecified protein-calorie malnutrition: Secondary | ICD-10-CM | POA: Diagnosis not present

## 2020-01-04 DIAGNOSIS — Z87891 Personal history of nicotine dependence: Secondary | ICD-10-CM

## 2020-01-04 DIAGNOSIS — K2101 Gastro-esophageal reflux disease with esophagitis, with bleeding: Secondary | ICD-10-CM | POA: Diagnosis not present

## 2020-01-04 DIAGNOSIS — R918 Other nonspecific abnormal finding of lung field: Secondary | ICD-10-CM | POA: Diagnosis not present

## 2020-01-04 DIAGNOSIS — K2091 Esophagitis, unspecified with bleeding: Secondary | ICD-10-CM | POA: Diagnosis not present

## 2020-01-04 DIAGNOSIS — K259 Gastric ulcer, unspecified as acute or chronic, without hemorrhage or perforation: Secondary | ICD-10-CM | POA: Diagnosis not present

## 2020-01-04 DIAGNOSIS — K2100 Gastro-esophageal reflux disease with esophagitis, without bleeding: Secondary | ICD-10-CM | POA: Diagnosis not present

## 2020-01-04 LAB — URINALYSIS, ROUTINE W REFLEX MICROSCOPIC
Bilirubin Urine: NEGATIVE
Glucose, UA: NEGATIVE mg/dL
Hgb urine dipstick: NEGATIVE
Ketones, ur: NEGATIVE mg/dL
Leukocytes,Ua: NEGATIVE
Nitrite: NEGATIVE
Protein, ur: NEGATIVE mg/dL
Specific Gravity, Urine: 1.013 (ref 1.005–1.030)
pH: 5 (ref 5.0–8.0)

## 2020-01-04 LAB — CBC WITH DIFFERENTIAL/PLATELET
Abs Immature Granulocytes: 0.37 10*3/uL — ABNORMAL HIGH (ref 0.00–0.07)
Basophils Absolute: 0 10*3/uL (ref 0.0–0.1)
Basophils Relative: 0 %
Eosinophils Absolute: 0 10*3/uL (ref 0.0–0.5)
Eosinophils Relative: 0 %
HCT: 25.8 % — ABNORMAL LOW (ref 39.0–52.0)
Hemoglobin: 7.8 g/dL — ABNORMAL LOW (ref 13.0–17.0)
Immature Granulocytes: 2 %
Lymphocytes Relative: 5 %
Lymphs Abs: 1 10*3/uL (ref 0.7–4.0)
MCH: 22 pg — ABNORMAL LOW (ref 26.0–34.0)
MCHC: 30.2 g/dL (ref 30.0–36.0)
MCV: 72.9 fL — ABNORMAL LOW (ref 80.0–100.0)
Monocytes Absolute: 2.1 10*3/uL — ABNORMAL HIGH (ref 0.1–1.0)
Monocytes Relative: 9 %
Neutro Abs: 18.7 10*3/uL — ABNORMAL HIGH (ref 1.7–7.7)
Neutrophils Relative %: 84 %
Platelets: 496 10*3/uL — ABNORMAL HIGH (ref 150–400)
RBC: 3.54 MIL/uL — ABNORMAL LOW (ref 4.22–5.81)
RDW: 18 % — ABNORMAL HIGH (ref 11.5–15.5)
WBC: 22.2 10*3/uL — ABNORMAL HIGH (ref 4.0–10.5)
nRBC: 0 % (ref 0.0–0.2)

## 2020-01-04 LAB — RAPID URINE DRUG SCREEN, HOSP PERFORMED
Amphetamines: NOT DETECTED
Barbiturates: NOT DETECTED
Benzodiazepines: NOT DETECTED
Cocaine: POSITIVE — AB
Opiates: NOT DETECTED
Tetrahydrocannabinol: NOT DETECTED

## 2020-01-04 LAB — BLOOD GAS, ARTERIAL
Acid-base deficit: 5.3 mmol/L — ABNORMAL HIGH (ref 0.0–2.0)
Bicarbonate: 17.2 mmol/L — ABNORMAL LOW (ref 20.0–28.0)
FIO2: 21
O2 Saturation: 95.1 %
Patient temperature: 98.6
pCO2 arterial: 26 mmHg — ABNORMAL LOW (ref 32.0–48.0)
pH, Arterial: 7.437 (ref 7.350–7.450)
pO2, Arterial: 86.5 mmHg (ref 83.0–108.0)

## 2020-01-04 LAB — COMPREHENSIVE METABOLIC PANEL
ALT: 15 U/L (ref 0–44)
AST: 16 U/L (ref 15–41)
Albumin: 2.7 g/dL — ABNORMAL LOW (ref 3.5–5.0)
Alkaline Phosphatase: 65 U/L (ref 38–126)
Anion gap: 26 — ABNORMAL HIGH (ref 5–15)
BUN: 87 mg/dL — ABNORMAL HIGH (ref 8–23)
CO2: 20 mmol/L — ABNORMAL LOW (ref 22–32)
Calcium: 8.3 mg/dL — ABNORMAL LOW (ref 8.9–10.3)
Chloride: 89 mmol/L — ABNORMAL LOW (ref 98–111)
Creatinine, Ser: 6.47 mg/dL — ABNORMAL HIGH (ref 0.61–1.24)
GFR calc Af Amer: 9 mL/min — ABNORMAL LOW (ref >60)
GFR calc non Af Amer: 8 mL/min — ABNORMAL LOW (ref >60)
Glucose, Bld: 148 mg/dL — ABNORMAL HIGH (ref 70–99)
Potassium: 2.5 mmol/L — CL (ref 3.5–5.1)
Sodium: 135 mmol/L (ref 135–145)
Total Bilirubin: 0.6 mg/dL (ref 0.3–1.2)
Total Protein: 7.5 g/dL (ref 6.5–8.1)

## 2020-01-04 LAB — PREPARE RBC (CROSSMATCH)

## 2020-01-04 LAB — PROTIME-INR
INR: 1.3 — ABNORMAL HIGH (ref 0.8–1.2)
Prothrombin Time: 16 seconds — ABNORMAL HIGH (ref 11.4–15.2)

## 2020-01-04 LAB — LIPASE, BLOOD: Lipase: 26 U/L (ref 11–51)

## 2020-01-04 LAB — FOLATE: Folate: 12.7 ng/mL (ref >5.9)

## 2020-01-04 LAB — FERRITIN: Ferritin: 207 ng/mL (ref 24–336)

## 2020-01-04 LAB — IRON AND TIBC
Iron: 68 ug/dL (ref 45–182)
Saturation Ratios: 22 % (ref 17.9–39.5)
TIBC: 315 ug/dL (ref 250–450)
UIBC: 247 ug/dL

## 2020-01-04 LAB — LACTIC ACID, PLASMA: Lactic Acid, Venous: 1.8 mmol/L (ref 0.5–1.9)

## 2020-01-04 LAB — VITAMIN B12: Vitamin B-12: 332 pg/mL (ref 180–914)

## 2020-01-04 LAB — APTT: aPTT: 36 seconds (ref 24–36)

## 2020-01-04 LAB — SARS CORONAVIRUS 2 BY RT PCR (HOSPITAL ORDER, PERFORMED IN ~~LOC~~ HOSPITAL LAB): SARS Coronavirus 2: NEGATIVE

## 2020-01-04 LAB — POC OCCULT BLOOD, ED: Fecal Occult Bld: POSITIVE — AB

## 2020-01-04 MED ORDER — PANTOPRAZOLE SODIUM 40 MG IV SOLR
40.0000 mg | Freq: Once | INTRAVENOUS | Status: AC
  Administered 2020-01-04: 40 mg via INTRAVENOUS
  Filled 2020-01-04: qty 40

## 2020-01-04 MED ORDER — TAMSULOSIN HCL 0.4 MG PO CAPS
0.4000 mg | ORAL_CAPSULE | Freq: Every day | ORAL | Status: DC
  Administered 2020-01-04 – 2020-01-17 (×13): 0.4 mg via ORAL
  Filled 2020-01-04 (×14): qty 1

## 2020-01-04 MED ORDER — PANTOPRAZOLE SODIUM 40 MG IV SOLR: 40.0000 mg | Freq: Two times a day (BID) | INTRAVENOUS | Status: DC

## 2020-01-04 MED ORDER — ONDANSETRON HCL 4 MG/2ML IJ SOLN
4.0000 mg | Freq: Once | INTRAMUSCULAR | Status: AC
  Administered 2020-01-04: 4 mg via INTRAVENOUS
  Filled 2020-01-04: qty 2

## 2020-01-04 MED ORDER — SODIUM CHLORIDE 0.9 % IV SOLN
8.0000 mg/h | INTRAVENOUS | Status: AC
  Administered 2020-01-04 – 2020-01-05 (×4): 8 mg/h via INTRAVENOUS
  Filled 2020-01-04 (×6): qty 80

## 2020-01-04 MED ORDER — SODIUM CHLORIDE 0.9 % IV SOLN
1.0000 g | Freq: Once | INTRAVENOUS | Status: AC
  Administered 2020-01-04: 1 g via INTRAVENOUS
  Filled 2020-01-04: qty 10

## 2020-01-04 MED ORDER — POTASSIUM CHLORIDE 10 MEQ/100ML IV SOLN
10.0000 meq | INTRAVENOUS | Status: AC
  Administered 2020-01-04 (×3): 10 meq via INTRAVENOUS
  Filled 2020-01-04 (×3): qty 100

## 2020-01-04 MED ORDER — LACTATED RINGERS IV BOLUS
1000.0000 mL | Freq: Once | INTRAVENOUS | Status: AC
  Administered 2020-01-04: 1000 mL via INTRAVENOUS

## 2020-01-04 MED ORDER — ORAL CARE MOUTH RINSE
15.0000 mL | Freq: Two times a day (BID) | OROMUCOSAL | Status: DC
  Administered 2020-01-04 – 2020-01-17 (×21): 15 mL via OROMUCOSAL

## 2020-01-04 MED ORDER — CHLORHEXIDINE GLUCONATE CLOTH 2 % EX PADS
6.0000 | MEDICATED_PAD | Freq: Every day | CUTANEOUS | Status: DC
  Administered 2020-01-04 – 2020-01-11 (×6): 6 via TOPICAL

## 2020-01-04 MED ORDER — ALBUTEROL SULFATE (2.5 MG/3ML) 0.083% IN NEBU: 2.5000 mg | INHALATION_SOLUTION | Freq: Four times a day (QID) | RESPIRATORY_TRACT | Status: DC | PRN

## 2020-01-04 MED ORDER — SODIUM CHLORIDE 0.9% IV SOLUTION: Freq: Once | INTRAVENOUS | Status: DC

## 2020-01-04 MED ORDER — SODIUM CHLORIDE 0.9 % IV SOLN: INTRAVENOUS | Status: DC

## 2020-01-04 MED ORDER — SODIUM CHLORIDE 0.9 % IV BOLUS
1000.0000 mL | Freq: Once | INTRAVENOUS | Status: AC
  Administered 2020-01-04: 1000 mL via INTRAVENOUS

## 2020-01-04 MED ORDER — SODIUM CHLORIDE 0.9 % IV BOLUS (SEPSIS)
1000.0000 mL | Freq: Once | INTRAVENOUS | Status: AC
  Administered 2020-01-04: 1000 mL via INTRAVENOUS

## 2020-01-04 MED ORDER — SODIUM CHLORIDE 0.9 % IV SOLN
10.0000 mL/h | Freq: Once | INTRAVENOUS | Status: AC
  Administered 2020-01-04: 10 mL/h via INTRAVENOUS

## 2020-01-04 MED ORDER — SODIUM CHLORIDE 0.9 % IV SOLN
1000.0000 mL | INTRAVENOUS | Status: DC
  Administered 2020-01-04: 1000 mL via INTRAVENOUS

## 2020-01-04 NOTE — ED Triage Notes (Signed)
Patient presents with nausea and vomiting for 1 week. The last 3 days the patient has been weak and bed bound vomiting and defecating on himself. EMS noted that the vomit appeared dark. A&O x4.  Hx STOMACH ULCERS  EMS vitals: 84/50 BP  96 HR 20 RR 100% SPO2 on room air 156 CBG

## 2020-01-04 NOTE — ED Provider Notes (Signed)
[facility]-EMERGENCY DEPT Provider Note   CSN: [id number] Arrival date & time: 01/04/20  1114     History Chief Complaint  Patient presents with  . Weakness  . Emesis  . Nausea  . Generalized Body Aches  . Diarrhea    [name] is a 74 y.o. male.  HPI   Patient presents to the ED for evaluation of nausea and vomiting this past week.  Patient states he has had multiple episodes of nausea and vomiting.  He has become progressively weak.  Patient states he was unable to go to the bathroom but can stooling on himself.  He has been having diffuse abdominal cramping.  Eventually was able to call EMS.  EMS noted that the patient was hypotensive and appeared to have dark emesis.  Patient denies any fevers.  Patient does have history of peptic ulcer disease.  He has not noticed any bright red blood in his emesis or stool.  Past Medical History:  Diagnosis Date  . Arthritis    "arms, elbows" (11/08/2016)  . Asthma   . COPD (chronic obstructive pulmonary disease) (HCC)   . CVA (cerebral vascular accident) (HCC) 12/2004   and hemorrhagic likely secondary to very uncontrolled hypertension /notes 10/06/2010  . GERD (gastroesophageal reflux disease)   . High cholesterol   . Hypertension   . Migraine    "just started having them in the last couple weeks; qod" (11/08/2016)  . Peptic ulcer disease   . Pneumonia 02/2007    Patient Active Problem List   Diagnosis Date Noted  . Frontal headache   . Hypertension 11/08/2016  . COPD (chronic obstructive pulmonary disease) (HCC) 11/08/2016  . GERD (gastroesophageal reflux disease) 11/08/2016  . Acute kidney injury (HCC) 11/08/2016  . BPH (benign prostatic hyperplasia) 11/08/2016  . Polysubstance dependence (HCC) 11/08/2016  . Hip pain, bilateral 09/17/2014  . AKI (acute kidney injury) (HCC) 09/10/2014  . Orthostatic hypotension 09/10/2014  . Nausea and vomiting 09/10/2014  . Polysubstance abuse (HCC) 09/10/2014    . Syncope 09/09/2014  . Peptic ulcer disease   . Benign prostatic hypertrophy (BPH) with incomplete bladder emptying 03/17/2014  . CHEST PAIN, RIGHT 05/23/2007  . Essential hypertension 04/05/2007  . NON-HEALING SURGICAL WOUND NEC 03/19/2007  . METHICILLIN-RESISTANT STAPHYLOCOCCUS AUREUS PNEUMONIA 03/15/2007  . EMPYEMA 03/15/2007    Past Surgical History:  Procedure Laterality Date  . MASS EXCISION Left 01/2003   of the occipital scalp and soft tissue mass of the left face/notes 10/06/2010  . TONSILLECTOMY  1950s  . TRANSURETHRAL RESECTION OF PROSTATE N/A 03/17/2014   Procedure: TRANSURETHRAL RESECTION OF THE PROSTATE (TURP) WITH GYRUS;  Surgeon: Garnett Farm, MD;  Location: [facility] ORS;  Service: Urology;  Laterality: N/A;  . VIDEO ASSISTED THORACOSCOPY (VATS)/EMPYEMA Right 02/2007    drainage of empyema with decortication Hattie Perch 09/22/2010       Family History  Problem Relation Age of Onset  . Cirrhosis Mother   . Cancer Brother     Social History   Tobacco Use  . Smoking status: Former Smoker    Packs/day: 0.50    Years: 40.00    Pack years: 20.00    Types: Cigarettes    Quit date: 10/21/2016    Years since quitting: 3.2  . Smokeless tobacco: Never Used  . Tobacco comment: 05/02/18 5 cigs daily  Vaping Use  . Vaping Use: Never used  Substance Use Topics  . Alcohol use: Not Currently    Alcohol/week: 0.0 standard drinks  Comment: 11/08/2016 "nothing in the last year"  . Drug use: Not Currently    Types: Heroin    Comment: 11/08/2016 "nothing in the last year"    Home Medications Prior to Admission medications   Medication Sig Start Date End Date Taking? Authorizing Provider  acetaminophen (TYLENOL) 325 MG tablet Take 2 tablets (650 mg total) by mouth every 6 (six) hours as needed for mild pain (or Fever >/= 101). 09/12/14   Rama, Maryruth Bun, MD  albuterol (PROVENTIL HFA;VENTOLIN HFA) 108 (90 Base) MCG/ACT inhaler Inhale 2 puffs into the lungs every 4 (four) hours  as needed for wheezing or shortness of breath. 10/31/16   Cathren Laine, MD  amLODipine-benazepril (LOTREL) 10-40 MG capsule Take 1 capsule by mouth daily.  04/24/18   [provider]  ASPIRIN LOW DOSE 81 MG EC tablet Take 81 mg by mouth daily. 09/26/19   [provider]  Aspirin-Acetaminophen-Caffeine (GOODY HEADACHE PO) Take 1 packet by mouth 2 (two) times daily as needed (headaches).     [provider]  cloNIDine (CATAPRES) 0.1 MG tablet Take 1 tablet (0.1 mg total) by mouth 2 (two) times daily. Patient taking differently: Take 0.1-0.2 mg by mouth See admin instructions. 0.1 mg in the morning and 0.2 mg at bedtime 09/12/14   Rama, Maryruth Bun, MD  folic acid (FOLVITE) 1 MG tablet Take 1 tablet (1 mg total) by mouth daily. Patient not taking: Reported on 11/08/2016 09/12/14   Rama, Maryruth Bun, MD  Multiple Vitamin (MULTIVITAMIN WITH MINERALS) TABS tablet Take 1 tablet by mouth daily. Patient not taking: Reported on 11/08/2016 09/12/14   Rama, Maryruth Bun, MD  nitroGLYCERIN (NITROSTAT) 0.4 MG SL tablet Place 0.4 mg under the tongue every 5 (five) minutes x 3 doses as needed for chest pain. 09/26/19   [provider]  omeprazole (PRILOSEC) 40 MG capsule Take 1 capsule (40 mg total) by mouth every morning. 09/07/14   Fayrene Helper, PA-C  polyethylene glycol (MIRALAX / Ethelene Hal) packet Take 17 g by mouth daily. Patient not taking: Reported on 05/02/2018 11/12/16   Rhetta Mura, MD  SUBOXONE 8-2 MG FILM Place 1 Film under the tongue 2 (two) times daily.  10/31/16   [provider]  SYMBICORT 160-4.5 MCG/ACT inhaler Inhale 2 puffs into the lungs 2 (two) times daily.  02/22/18   [provider]  tamsulosin (FLOMAX) 0.4 MG CAPS capsule Take 0.4 mg by mouth daily. 10/24/19   [provider]  thiamine 100 MG tablet Take 1 tablet (100 mg total) by mouth daily. Patient not taking: Reported on 11/08/2016 09/12/14   Rama, Maryruth Bun, MD  topiramate  (TOPAMAX) 50 MG tablet Take 1 tablet (50 mg total) by mouth 2 (two) times daily. 05/02/18   Penumalli, Glenford Bayley, MD  traZODone (DESYREL) 150 MG tablet Take 150 mg by mouth at bedtime.     [provider]  zolpidem (AMBIEN) 10 MG tablet Take 10 mg by mouth at bedtime.    [provider]    Allergies    Patient has no known allergies.  Review of Systems   Review of Systems  All other systems reviewed and are negative.   Physical Exam Updated Vital Signs BP (!) 118/57   Pulse 78   Temp 98.7 F (37.1 C) (Oral)   Resp (!) 21   SpO2 97%   Physical Exam Vitals and nursing note reviewed.  Constitutional:      General: He is in acute distress.  Appearance: He is well-developed. He is ill-appearing.  HENT:     Head: Normocephalic and atraumatic.     Right Ear: External ear normal.     Left Ear: External ear normal.     Nose:     Comments: Mucous membranes are dry    Mouth/Throat:     Mouth: Mucous membranes are dry.  Eyes:     General: No scleral icterus.       Right eye: No discharge.        Left eye: No discharge.     Conjunctiva/sclera: Conjunctivae normal.  Neck:     Trachea: No tracheal deviation.  Cardiovascular:     Rate and Rhythm: Normal rate and regular rhythm.  Pulmonary:     Effort: Pulmonary effort is normal. No respiratory distress.     Breath sounds: Normal breath sounds. No stridor. No wheezing or rales.  Abdominal:     General: Bowel sounds are normal. There is no distension.     Palpations: Abdomen is soft.     Tenderness: There is no abdominal tenderness. There is no guarding or rebound.  Genitourinary:    Comments: Black stool on rectal exam Musculoskeletal:        General: No tenderness.     Cervical back: Neck supple.  Skin:    General: Skin is warm and dry.     Findings: No rash.  Neurological:     Mental Status: He is alert.     Cranial Nerves: No cranial nerve deficit (no facial droop, extraocular movements intact, no  slurred speech).     Sensory: No sensory deficit.     Motor: Weakness present. No abnormal muscle tone or seizure activity.     Coordination: Coordination normal.     ED Results / Procedures / Treatments   Labs (all labs ordered are listed, but only abnormal results are displayed) Labs Reviewed  COMPREHENSIVE METABOLIC PANEL - Abnormal; Notable for the following components:      Result Value   Potassium 2.5 (*)    Chloride 89 (*)    CO2 20 (*)    Glucose, Bld 148 (*)    BUN 87 (*)    Creatinine, Ser 6.47 (*)    Calcium 8.3 (*)    Albumin 2.7 (*)    GFR calc non Af Amer 8 (*)    GFR calc Af Amer 9 (*)    Anion gap 26 (*)    All other components within normal limits  CBC WITH DIFFERENTIAL/PLATELET - Abnormal; Notable for the following components:   WBC 22.2 (*)    RBC 3.54 (*)    Hemoglobin 7.8 (*)    HCT 25.8 (*)    MCV 72.9 (*)    MCH 22.0 (*)    RDW 18.0 (*)    Platelets 496 (*)    Neutro Abs 18.7 (*)    Monocytes Absolute 2.1 (*)    Abs Immature Granulocytes 0.37 (*)    All other components within normal limits  PROTIME-INR - Abnormal; Notable for the following components:   Prothrombin Time 16.0 (*)    INR 1.3 (*)    All other components within normal limits  POC OCCULT BLOOD, ED - Abnormal; Notable for the following components:   Fecal Occult Bld POSITIVE (*)    All other components within normal limits  SARS CORONAVIRUS 2 BY RT PCR (HOSPITAL ORDER, PERFORMED IN [facility])  APTT  LIPASE, BLOOD  LACTIC ACID, PLASMA  TYPE AND SCREEN  PREPARE RBC (CROSSMATCH)     Radiology DG Chest Port 1 View  Result Date: 01/04/2020 CLINICAL DATA:  Vomiting for 1 week. EXAM: PORTABLE CHEST 1 VIEW COMPARISON:  11/08/2016 FINDINGS: Nodular prominence along the right diaphragmatic surface. No focal consolidation. No pleural effusion or pneumothorax. Heart and mediastinal contours are unremarkable. No acute osseous abnormality. IMPRESSION: 1. No acute  cardiopulmonary disease. 2. Nodular prominence along the right diaphragmatic surface which may reflect a prominent nipple shadow or a true pulmonary nodule. Recommend further evaluation with a lateral view of the chest and a frontal view with a nipple markers. Electronically Signed   By: Elige KoHetal  Patel   On: 01/04/2020 14:07    Procedures .Critical Care Performed by: Linwood DibblesKnapp, Angelicia Lessner, MD Authorized by: Linwood DibblesKnapp, Gennifer Potenza, MD   Critical care provider statement:    Critical care time (minutes):  45   Critical care was time spent personally by me on the following activities:  Discussions with consultants, evaluation of patient's response to treatment, examination of patient, ordering and performing treatments and interventions, ordering and review of laboratory studies, ordering and review of radiographic studies, pulse oximetry, re-evaluation of patient's condition, obtaining history from patient or surrogate and review of old charts   (including critical care time)  Medications Ordered in ED Medications  sodium chloride 0.9 % bolus 1,000 mL (0 mLs Intravenous Stopped 01/04/20 1325)    Followed by  0.9 %  sodium chloride infusion (1,000 mLs Intravenous New Bag/Given 01/04/20 1508)  0.9 %  sodium chloride infusion (has no administration in time range)  pantoprazole (PROTONIX) 80 mg in sodium chloride 0.9 % 100 mL (0.8 mg/mL) infusion (8 mg/hr Intravenous New Bag/Given 01/04/20 1554)  pantoprazole (PROTONIX) injection 40 mg (40 mg Intravenous Given 01/04/20 1303)  ondansetron (ZOFRAN) injection 4 mg (4 mg Intravenous Given 01/04/20 1304)  lactated ringers bolus 1,000 mL (1,000 mLs Intravenous New Bag/Given 01/04/20 1507)  cefTRIAXone (ROCEPHIN) 1 g in sodium chloride 0.9 % 100 mL IVPB (0 g Intravenous Stopped 01/04/20 1539)    ED Course  I have reviewed the triage vital signs and the nursing notes.  Pertinent labs & imaging results that were available during my care of the patient were reviewed by me and  considered in my medical decision making (see chart for details).  Clinical Course as of Jan 03 1602  Sat Jan 04, 2020  1438 Labs reviewed.  Potassium decreased to 2.5.  Creatinine significantly elevated at 6.47   [JK]  1438 White blood cell count elevated at 22.  Hemoglobin decreased to 7.8   [JK]  1438 Consistent with acute GI bleed and acute kidney injury   [JK]  1438 IV fluids ordered but blood pressure remains low.  I will order blood transfusion   [JK]  1444 Additional IV fluid boluses have also been ordered.   [JK]  1454 Discussed with Dr Sherene SiresWert   [JK]  1540 Most recent blood pressure 102/51.    [JK]  1603 Bladder scan almost 500cc.  Will insert foley   [JK]    Clinical Course User Index [JK] Linwood DibblesKnapp, Ronelle Smallman, MD   MDM Rules/Calculators/A&P              Workup concerning for acute gi bleed and acute kidney injury.   Pt has acute blood anemia.  Hypotension likely related to kidney injury and the gi bleed.  IV fluids, protonix and blood ordered.  Will check bladder scan.  Empiric abx although I doubt sepsis.  Will consult GI, critical care service for consultation and admission.  Continue to monitor closely with his hypotension and shock.  Final Clinical Impression(s) / ED Diagnoses Final diagnoses:  Hypotension  Gastrointestinal hemorrhage, unspecified gastrointestinal hemorrhage type  AKI (acute kidney injury) (HCC)  Hypokalemia      Linwood Dibbles, MD 01/05/20 [phone]

## 2020-01-04 NOTE — ED Provider Notes (Signed)
4:05 PM-case discussed with Dr. Lynelle Doctor.  Patient is awaiting arrival of critical care, to assume management and admit for GI bleed with hypotension.  Patient found to have urinary retention Foley catheter was placed.  Dr. Lynelle Doctor apparently discussed the case with GI, Dr. Meridee Score, who has posted the patient for EGD, tomorrow.  Patient reported to have black stool on rectal examination.  He has very low MCV.  He has been treated with IV fluids, and PPI.  Blood has been ordered.  He has responded treatments with improved, blood pressure.  4:40 PM-critical care, Dr. Sherene Sires, has evaluated the patient.  He defers admission to hospitalist service since the patient has become stable and does not require intensive care treatment.  Will page hospitalist for admission.  Hospitalist will admit the patient-4:58 PM   Mancel Bale, MD 01/04/20 1702

## 2020-01-04 NOTE — ED Notes (Addendum)
Unable to obtain blood cultures and labs at this time.

## 2020-01-04 NOTE — H&P (Signed)
History and Physical    [name] NWG:[id number] DOB: 1946-02-21 DOA: 01/04/2020  PCP: [name], MD Patient coming from: Home Chief Complaint: Black stools nausea vomiting and diarrhea for 3 days to 4 days  HPI: [name] is a 74 y.o. male with medical history significant of peptic ulcer disease admitted with nausea vomiting and black stools.  Patient admitted with nausea vomiting and diarrhea for the past 4 days prior to admission to hospital. Associated with this he also complains of abdominal discomfort. He had subjective fever at home though he did not check his temperature at home. Denies any cough. He did not have Covid in the past year. He denies any urinary complaints. He does complain of shortness of breath and dyspnea on exertion he reports occasional chest pain. He reports he has not been able to eat or drink anything for the last 4 days not been able to keep it down as he was vomiting. He takes Goody powder and aspirin and multiple antihypertensives at home. He is also on Suboxone at home. ED Course: His potassium was 2.5 sodium was 135 BUN 87 creatinine 6.47 anion gap was 26, white count 22.2, hemoglobin 7.8 platelets 496 INR 1.3 Blood pressure 95/56 came up to 105/ 54 heart rate 90 respiration 19 sats 99% on 2 L temperature 98.7. He was seen by PCCM in the ER and asked hospitalist to admit the patient.  Review of Systems: As per HPI otherwise all other systems reviewed and are negative  Ambulatory Status he is ambulatory at baseline.  Past Medical History:  Diagnosis Date  . Arthritis    "arms, elbows" (11/08/2016)  . Asthma   . COPD (chronic obstructive pulmonary disease) (HCC)   . CVA (cerebral vascular accident) (HCC) 12/2004   and hemorrhagic likely secondary to very uncontrolled hypertension /notes 10/06/2010  . GERD (gastroesophageal reflux disease)   . High cholesterol   . Hypertension   . Migraine    "just started having them in the  last couple weeks; qod" (11/08/2016)  . Peptic ulcer disease   . Pneumonia 02/2007    Past Surgical History:  Procedure Laterality Date  . MASS EXCISION Left 01/2003   of the occipital scalp and soft tissue mass of the left face/notes 10/06/2010  . TONSILLECTOMY  1950s  . TRANSURETHRAL RESECTION OF PROSTATE N/A 03/17/2014   Procedure: TRANSURETHRAL RESECTION OF THE PROSTATE (TURP) WITH GYRUS;  Surgeon: Garnett Farm, MD;  Location: [facility];  Service: Urology;  Laterality: N/A;  . VIDEO ASSISTED THORACOSCOPY (VATS)/EMPYEMA Right 02/2007    drainage of empyema with decortication Hattie Perch 09/22/2010    Social History   Socioeconomic History  . Marital status: Divorced    Spouse name: Not on file  . Number of children: 3  . Years of education: 79  . Highest education level: Not on file  Occupational History    Comment: retired  Tobacco Use  . Smoking status: Former Smoker    Packs/day: 0.50    Years: 40.00    Pack years: 20.00    Types: Cigarettes    Quit date: 10/21/2016    Years since quitting: 3.2  . Smokeless tobacco: Never Used  . Tobacco comment: 05/02/18 5 cigs daily  Vaping Use  . Vaping Use: Never used  Substance and Sexual Activity  . Alcohol use: Not Currently    Alcohol/week: 0.0 standard drinks    Comment: 11/08/2016 "nothing in the last year"  . Drug use: Not Currently  Types: Heroin    Comment: 11/08/2016 "nothing in the last year"  . Sexual activity: Not Currently  Other Topics Concern  . Not on file  Social History Narrative   Lives with his brother   Caffeine- coffee, 1 cup daily   Social Determinants of Health   Financial Resource Strain:   . Difficulty of Paying Living Expenses:   Food Insecurity:   . Worried About Programme researcher, broadcasting/film/video in the Last Year:   . Barista in the Last Year:   Transportation Needs:   . Freight forwarder (Medical):   Marland Kitchen Lack of Transportation (Non-Medical):   Physical Activity:   . Days of Exercise per Week:     . Minutes of Exercise per Session:   Stress:   . Feeling of Stress :   Social Connections:   . Frequency of Communication with Friends and Family:   . Frequency of Social Gatherings with Friends and Family:   . Attends Religious Services:   . Active Member of Clubs or Organizations:   . Attends Banker Meetings:   Marland Kitchen Marital Status:   Intimate Partner Violence:   . Fear of Current or Ex-Partner:   . Emotionally Abused:   Marland Kitchen Physically Abused:   . Sexually Abused:     No Known Allergies  Family History  Problem Relation Age of Onset  . Cirrhosis Mother   . Cancer Brother       Prior to Admission medications   Medication Sig Start Date End Date Taking? Authorizing Provider  acetaminophen (TYLENOL) 325 MG tablet Take 2 tablets (650 mg total) by mouth every 6 (six) hours as needed for mild pain (or Fever >/= 101). 09/12/14   Rama, Maryruth Bun, MD  albuterol (PROVENTIL HFA;VENTOLIN HFA) 108 (90 Base) MCG/ACT inhaler Inhale 2 puffs into the lungs every 4 (four) hours as needed for wheezing or shortness of breath. 10/31/16   Cathren Laine, MD  amLODipine-benazepril (LOTREL) 10-40 MG capsule Take 1 capsule by mouth daily.  04/24/18   [provider]  ASPIRIN LOW DOSE 81 MG EC tablet Take 81 mg by mouth daily. 09/26/19   [provider]  Aspirin-Acetaminophen-Caffeine (GOODY HEADACHE PO) Take 1 packet by mouth 2 (two) times daily as needed (headaches).     [provider]  Buprenorphine HCl-Naloxone HCl 8-2 MG FILM Place 1 Film under the tongue daily.    [provider]  cloNIDine (CATAPRES) 0.1 MG tablet Take 1 tablet (0.1 mg total) by mouth 2 (two) times daily. Patient taking differently: Take 0.1-0.2 mg by mouth See admin instructions. 0.1 mg in the morning and 0.2 mg at bedtime 09/12/14   Rama, Maryruth Bun, MD  folic acid (FOLVITE) 1 MG tablet Take 1 tablet (1 mg total) by mouth daily. Patient not taking: Reported on 11/08/2016 09/12/14   Rama,  Maryruth Bun, MD  Multiple Vitamin (MULTIVITAMIN WITH MINERALS) TABS tablet Take 1 tablet by mouth daily. Patient not taking: Reported on 11/08/2016 09/12/14   Rama, Maryruth Bun, MD  nitroGLYCERIN (NITROSTAT) 0.4 MG SL tablet Place 0.4 mg under the tongue every 5 (five) minutes x 3 doses as needed for chest pain. 09/26/19   [provider]  omeprazole (PRILOSEC) 40 MG capsule Take 1 capsule (40 mg total) by mouth every morning. 09/07/14   Fayrene Helper, PA-C  polyethylene glycol (MIRALAX / Ethelene Hal) packet Take 17 g by mouth daily. Patient not taking: Reported on 05/02/2018 11/12/16   Rhetta Mura, MD  SYMBICORT 160-4.5 MCG/ACT inhaler Inhale 2 puffs into the lungs 2 (two) times daily.  02/22/18   [provider]  tamsulosin (FLOMAX) 0.4 MG CAPS capsule Take 0.4 mg by mouth daily. 10/24/19   [provider]  thiamine 100 MG tablet Take 1 tablet (100 mg total) by mouth daily. Patient not taking: Reported on 11/08/2016 09/12/14   Rama, Maryruth Bunhristina P, MD  topiramate (TOPAMAX) 50 MG tablet Take 1 tablet (50 mg total) by mouth 2 (two) times daily. 05/02/18   Penumalli, Glenford BayleyVikram R, MD  traZODone (DESYREL) 150 MG tablet Take 150 mg by mouth at bedtime.     [provider]  zolpidem (AMBIEN) 10 MG tablet Take 10 mg by mouth at bedtime.    [provider]    Physical Exam: Patient resting in bed very tearful reports he has a lot going on Vitals:   01/04/20 1545 01/04/20 1551 01/04/20 1600 01/04/20 1701  BP: (!) 104/51  (!) 118/57 (!) 105/54  Pulse:  86 78 89  Resp: 17  (!) 21 19  Temp:      TempSrc:      SpO2:  100% 97% 99%     . General: He is very tearful . Eyes: PERRL, EOMI, normal lids, iris . ENT:  grossly normal hearing, lips & tongue, mmm . Neck: no LAD, masses or thyromegaly . Cardiovascular: RRR, no m/r/g. No LE edema.  Marland Kitchen. Respiratory:  CTA bilaterally, no w/r/r. Normal respiratory effort. . Abdomen:soft, ntnd, NABS . Skin:  no rash or induration  seen on limited exam . Musculoskeletal: grossly normal tone BUE/BLE, good ROM, no bony abnormality . Psychiatric:  grossly normal mood and affect, speech fluent and appropriate, AOx3 . Neurologic: CN 2-12 grossly intact, moves all extremities in coordinated fashion, sensation intact  Labs on Admission: I have personally reviewed following labs and imaging studies  CBC: Recent Labs  Lab 01/04/20 1258  WBC 22.2*  NEUTROABS 18.7*  HGB 7.8*  HCT 25.8*  MCV 72.9*  PLT 496*   Basic Metabolic Panel: Recent Labs  Lab 01/04/20 1258  NA 135  K 2.5*  CL 89*  CO2 20*  GLUCOSE 148*  BUN 87*  CREATININE 6.47*  CALCIUM 8.3*   GFR: CrCl cannot be calculated (Unknown ideal weight.). Liver Function Tests: Recent Labs  Lab 01/04/20 1258  AST 16  ALT 15  ALKPHOS 65  BILITOT 0.6  PROT 7.5  ALBUMIN 2.7*   Recent Labs  Lab 01/04/20 1258  LIPASE 26   No results for input(s): AMMONIA in the last 168 hours. Coagulation Profile: Recent Labs  Lab 01/04/20 1258  INR 1.3*   Cardiac Enzymes: No results for input(s): CKTOTAL, CKMB, CKMBINDEX, TROPONINI in the last 168 hours. BNP (last 3 results) No results for input(s): PROBNP in the last 8760 hours. HbA1C: No results for input(s): HGBA1C in the last 72 hours. CBG: No results for input(s): GLUCAP in the last 168 hours. Lipid Profile: No results for input(s): CHOL, HDL, LDLCALC, TRIG, CHOLHDL, LDLDIRECT in the last 72 hours. Thyroid Function Tests: No results for input(s): TSH, T4TOTAL, FREET4, T3FREE, THYROIDAB in the last 72 hours. Anemia Panel: No results for input(s): VITAMINB12, FOLATE, FERRITIN, TIBC, IRON, RETICCTPCT in the last 72 hours. Urine analysis:    Component Value Date/Time   COLORURINE YELLOW 11/08/2016 2002   APPEARANCEUR CLEAR 11/08/2016 2002   LABSPEC 1.024 11/08/2016 2002   PHURINE 5.0 11/08/2016 2002   GLUCOSEU NEGATIVE 11/08/2016 2002   HGBUR NEGATIVE 11/08/2016 2002  BILIRUBINUR NEGATIVE  11/08/2016 2002   KETONESUR 5 (A) 11/08/2016 2002   PROTEINUR NEGATIVE 11/08/2016 2002   UROBILINOGEN 1.0 09/09/2014 2330   NITRITE NEGATIVE 11/08/2016 2002   LEUKOCYTESUR LARGE (A) 11/08/2016 2002    Creatinine Clearance: CrCl cannot be calculated (Unknown ideal weight.).  Sepsis Labs: @LABRCNTIP (procalcitonin:4,lacticidven:4) ) Recent Results (from the past 240 hour(s))  SARS Coronavirus 2 by RT PCR (hospital order, performed in [facility] hospital lab) Nasopharyngeal Nasopharyngeal Swab     Status: None   Collection Time: 01/04/20 12:58 PM   Specimen: Nasopharyngeal Swab  Result Value Ref Range Status   SARS Coronavirus 2 NEGATIVE NEGATIVE Final    Comment: (NOTE) SARS-CoV-2 target nucleic acids are NOT DETECTED.  The SARS-CoV-2 RNA is generally detectable in upper and lower respiratory specimens during the acute phase of infection. The lowest concentration of SARS-CoV-2 viral copies this assay can detect is 250 copies / mL. A negative result does not preclude SARS-CoV-2 infection and should not be used as the sole basis for treatment or other patient management decisions.  A negative result may occur with improper specimen collection / handling, submission of specimen other than nasopharyngeal swab, presence of viral mutation(s) within the areas targeted by this assay, and inadequate number of viral copies (<250 copies / mL). A negative result must be combined with clinical observations, patient history, and epidemiological information.  Fact Sheet for Patients:   01/06/20  Fact Sheet for Healthcare Providers: BoilerBrush.com.cy  This test is not yet approved or  cleared by the https://pope.com/ FDA and has been authorized for detection and/or diagnosis of SARS-CoV-2 by FDA under an Emergency Use Authorization (EUA).  This EUA will remain in effect (meaning this test can be used) for the duration of the COVID-19  declaration under Section 564(b)(1) of the Act, 21 U.S.C. section 360bbb-3(b)(1), unless the authorization is terminated or revoked sooner.  Performed at [facility], [address] [place], [place]      Radiological Exams on Admission: [address]115 Abdomen Complete  Result Date: 01/04/2020 CLINICAL DATA:  Abdominal pain EXAM: ABDOMEN ULTRASOUND COMPLETE COMPARISON:  None. FINDINGS: Gallbladder: The gallbladder is distended layering sludge/small stones. No sonographic Murphy sign seen. There is a mildly prominent gallbladder wall measuring up to 3.5 mm Common bile duct: Diameter: 5.3 mm Liver: Increased echotexture seen throughout. No focal abnormality or biliary ductal dilatation. The liver is enlarged measuring 19 cm in craniocaudad dimension. Portal vein is patent on color Doppler imaging with normal direction of blood flow towards the liver. IVC: No abnormality visualized. Pancreas: Visualized portion unremarkable. Spleen: Size and appearance within normal limits. Right Kidney: Length: 11.3 cm. Echogenicity within normal limits. No mass or hydronephrosis visualized. Left Kidney: Length: 12.0cm. Echogenicity within normal limits. No mass or hydronephrosis visualized. Abdominal aorta: No aneurysm visualized. Other findings: None. IMPRESSION: Layering gallbladder sludge/small stones with a mildly prominent gallbladder wall. This is nonspecific, could be due to acute cholecystitis. Hepatic steatosis and mild hepatomegaly Electronically Signed   By: 01/06/2020 M.D.   On: 01/04/2020 16:40   DG Chest Port 1 View  Result Date: 01/04/2020 CLINICAL DATA:  Vomiting for 1 week. EXAM: PORTABLE CHEST 1 VIEW COMPARISON:  11/08/2016 FINDINGS: Nodular prominence along the right diaphragmatic surface. No focal consolidation. No pleural effusion or pneumothorax. Heart and mediastinal contours are unremarkable. No acute osseous abnormality. IMPRESSION: 1. No acute cardiopulmonary disease. 2.  Nodular prominence along the right diaphragmatic surface which may reflect a prominent nipple shadow or  a true pulmonary nodule. Recommend further evaluation with a lateral view of the chest and a frontal view with a nipple markers. Electronically Signed   By: Elige Ko   On: 01/04/2020 14:07    EKG: Ordered  Assessment/Plan Active Problems:   * No active hospital problems. *  #1 GI bleed likely upper rule out peptic ulcer disease, patient takes Goody powder and aspirin at home. Patient presented with black stools nausea and vomiting. He has history of peptic ulcer disease. Start PPI blood transfusion IV fluids GI planning for EGD in a.m. n.p.o. for the procedure  #2 AKI likely secondary to hypotension dehydration nausea vomiting and diarrhea and ACE inhibitor's and antihypertensives at home.. Patient is not aware of having any kidney problems in the past. Will treat with IV fluids recheck labs in a.m. Hold ACE inhibitor.  #3 leukocytosis-check UA chest x-ray blood cultures. Not done in the ER. Possible UTI present on admission patient with urine leukocyte Estrace and ketones positive. Patient received Rocephin in the ER.  #4 high gap acidosis question secondary to starvation check UA positive for ketones  #5 urinary retention bladder scan showed 500 cc Foley catheter was placed in the ED.  Patient is on Flomax for BPH.  #6 history of polysubstance abuse on Suboxone.  #7 hypotension likely secondary to GI bleed and dehydration and nausea vomiting and diarrhea and ongoing use of antihypertensives.  However will need to rule out any infection.  #8 SIRS patient with leukocytosis and hypotension check blood cultures urine culture  Severity of Illness: The appropriate patient status for this patient is INPATIENT. Inpatient status is judged to be reasonable and necessary in order to provide the required intensity of service to ensure the patient's safety. The patient's presenting symptoms,  physical exam findings, and initial radiographic and laboratory data in the context of their chronic comorbidities is felt to place them at high risk for further clinical deterioration. Furthermore, it is not anticipated that the patient will be medically stable for discharge from the hospital within 2 midnights of admission. The following factors support the patient status of inpatient.   " The patient's presenting symptoms include nausea vomiting diarrhea black stools. " The worrisome physical exam findings include hypotension " The initial radiographic and laboratory data are worrisome because of anemia AKI creatinine about 6" The chronic co-morbidities include peptic ulcer disease hypertension BPH  * I certify that at the point of admission it is my clinical judgment that the patient will require inpatient hospital care spanning beyond 2 midnights from the point of admission due to high intensity of service, high risk for further deterioration and high frequency of surveillance required.*   Estimated body mass index is 24.17 kg/m as calculated from the following:   Height as of 05/02/18: 6' (1.829 m).   Weight as of 05/02/18: 80.8 kg.   DVT prophylaxis: SCD due to GI bleed Code Status: Full code Family Communication: None at bedside Disposition Plan: Pending clinical improvement and GI work-up Consults called: GI Admission status: Inpatient   Alwyn Ren MD [facility]  If 7PM-7AM, please contact night-coverage [email or web address] Password [facility]  01/04/2020, 5:25 PM

## 2020-01-04 NOTE — H&P (View-Only) (Signed)
Gastroenterology Inpatient Consultation   Attending Requesting Consult Mancel BaleWentz, Elliott, MD  [facility]  Reason for Consult Anemia with melena and abdominal pain with associated coffee-ground emesis/hematemesis   History of Present Illness  Alan Lewandowskyathaniel Aderman is a 74 y.o. male with a pmh significant for asthma/COPD, prior CVA, hypertension, hyperlipidemia, migraines, chart reported PUD (patient does not recall ulcer disease however).  The GI service is consulted for evaluation and management of Anemia with melena and abdominal pain with associated coffee-ground emesis/hematemesis.    The patient is evaluated in the emergency department this evening.  The patient states over the course the last 3 to 4 weeks he has had an approximately 20 to 30 pound weight loss.  His appetite has been significantly worsening.  He has had decreased oral intake.  However, for the last 4 to 5 days he has had increasing episodes of coffee-ground emesis and hematemesis.  He has had diarrhea as well during this course of time.  The stool has been black and tarry appearing over the course the last few days.  Progressive fatigue has been occurring.  As result of the progressive issues that were occurring the patient was brought in for further evaluation.  He was found to have anemia with microcytosis.  Patient does take aspirin on a daily basis.  He also takes BC powders on a regular basis during the course of the week for headaches or joint pains.  The patient was found to have significant acute renal insufficiency as well.  Patient has been given transfusions in effort of trying to optimize his hemodynamics.  GI Review of Systems Positive as above including pyrosis at times, anorexia, early satiety, abdominal pain in the left upper quadrant Negative for dysphagia, odynophagia, hematochezia   Review of Systems  General: Denies fevers/chills HEENT: Denies oral lesions Cardiovascular: Denies chest  pain Pulmonary: Denies shortness of breath Gastroenterological: See HPI Genitourinary: Denies darkened urine or hematuria Hematological: Positive for longstanding easy bruising/bleeding Dermatological: Denies jaundice Psychological: Mood is stable   Histories  Past Medical History Past Medical History:  Diagnosis Date  . Arthritis    "arms, elbows" (11/08/2016)  . Asthma   . COPD (chronic obstructive pulmonary disease) (HCC)   . CVA (cerebral vascular accident) (HCC) 12/2004   and hemorrhagic likely secondary to very uncontrolled hypertension /notes 10/06/2010  . GERD (gastroesophageal reflux disease)   . High cholesterol   . Hypertension   . Migraine    "just started having them in the last couple weeks; qod" (11/08/2016)  . Peptic ulcer disease   . Pneumonia 02/2007   Past Surgical History:  Procedure Laterality Date  . MASS EXCISION Left 01/2003   of the occipital scalp and soft tissue mass of the left face/notes 10/06/2010  . TONSILLECTOMY  1950s  . TRANSURETHRAL RESECTION OF PROSTATE N/A 03/17/2014   Procedure: TRANSURETHRAL RESECTION OF THE PROSTATE (TURP) WITH GYRUS;  Surgeon: Garnett FarmMark C Ottelin, MD;  Location: [facility];  Service: Urology;  Laterality: N/A;  . VIDEO ASSISTED THORACOSCOPY (VATS)/EMPYEMA Right 02/2007    drainage of empyema with decortication /notes 09/22/2010    Allergies No Known Allergies  Family History Family History  Problem Relation Age of Onset  . Cirrhosis Mother   . Cancer Brother    The patient's FH is negative for IBD/IBS/Liver Disease/GI Malignancies.  Social History Social History   Socioeconomic History  . Marital status: Divorced    Spouse name: Not on file  . Number of children:  3  . Years of education: 50  . Highest education level: Not on file  Occupational History    Comment: retired  Tobacco Use  . Smoking status: Former Smoker    Packs/day: 0.50    Years: 40.00    Pack years: 20.00    Types: Cigarettes    Quit date:  10/21/2016    Years since quitting: 3.2  . Smokeless tobacco: Never Used  . Tobacco comment: 05/02/18 5 cigs daily  Vaping Use  . Vaping Use: Never used  Substance and Sexual Activity  . Alcohol use: Not Currently    Alcohol/week: 0.0 standard drinks    Comment: 11/08/2016 "nothing in the last year"  . 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 Sexual activity: Not Currently  Other Topics Concern  . Not on file  Social History Narrative   Lives with his brother   Caffeine- coffee, 1 cup daily   Social Determinants of Health   Financial Resource Strain:   . Difficulty of Paying Living Expenses:   Food Insecurity:   . Worried About Programme researcher, broadcasting/film/video in the Last Year:   . Barista in the Last Year:   Transportation Needs:   . Freight forwarder (Medical):   Marland Kitchen Lack of Transportation (Non-Medical):   Physical Activity:   . Days of Exercise per Week:   . Minutes of Exercise per Session:   Stress:   . Feeling of Stress :   Social Connections:   . Frequency of Communication with Friends and Family:   . Frequency of Social Gatherings with Friends and Family:   . Attends Religious Services:   . Active Member of Clubs or Organizations:   . Attends Banker Meetings:   Marland Kitchen Marital Status:   Intimate Partner Violence:   . Fear of Current or Ex-Partner:   . Emotionally Abused:   Marland Kitchen Physically Abused:   . Sexually Abused:     Medications  Home Medications No current facility-administered medications on file prior to encounter.   Current Outpatient Medications on File Prior to Encounter  Medication Sig Dispense Refill  . acetaminophen (TYLENOL) 325 MG tablet Take 2 tablets (650 mg total) by mouth every 6 (six) hours as needed for mild pain (or Fever >/= 101).    Marland Kitchen albuterol (PROVENTIL HFA;VENTOLIN HFA) 108 (90 Base) MCG/ACT inhaler Inhale 2 puffs into the lungs every 4 (four) hours as needed for wheezing or  shortness of breath. 1 Inhaler 3  . amLODipine-benazepril (LOTREL) 10-40 MG capsule Take 1 capsule by mouth daily.   0  . ASPIRIN LOW DOSE 81 MG EC tablet Take 81 mg by mouth daily.    . Aspirin-Acetaminophen-Caffeine (GOODY HEADACHE PO) Take 1 packet by mouth 2 (two) times daily as needed (headaches).     . Buprenorphine HCl-Naloxone HCl 8-2 MG FILM Place 1 Film under the tongue daily.    . cloNIDine (CATAPRES) 0.1 MG tablet Take 1 tablet (0.1 mg total) by mouth 2 (two) times daily. (Patient taking differently: Take 0.1-0.2 mg by mouth See admin instructions. 0.1 mg in the morning and 0.2 mg at bedtime) 60 tablet 11  . folic acid (FOLVITE) 1 MG tablet Take 1 tablet (1 mg total) by mouth daily. (Patient not taking: Reported on 11/08/2016) 30 tablet 0  . Multiple Vitamin (MULTIVITAMIN WITH MINERALS) TABS tablet Take 1 tablet by mouth daily. (Patient not taking:  Reported on 11/08/2016)    . nitroGLYCERIN (NITROSTAT) 0.4 MG SL tablet Place 0.4 mg under the tongue every 5 (five) minutes x 3 doses as needed for chest pain.    Marland Kitchen omeprazole (PRILOSEC) 40 MG capsule Take 1 capsule (40 mg total) by mouth every morning. 30 capsule 0  . polyethylene glycol (MIRALAX / GLYCOLAX) packet Take 17 g by mouth daily. (Patient not taking: Reported on 05/02/2018) 14 each 0  . SYMBICORT 160-4.5 MCG/ACT inhaler Inhale 2 puffs into the lungs 2 (two) times daily.     . tamsulosin (FLOMAX) 0.4 MG CAPS capsule Take 0.4 mg by mouth daily.    Marland Kitchen thiamine 100 MG tablet Take 1 tablet (100 mg total) by mouth daily. (Patient not taking: Reported on 11/08/2016) 30 tablet 2  . topiramate (TOPAMAX) 50 MG tablet Take 1 tablet (50 mg total) by mouth 2 (two) times daily. 60 tablet 12  . traZODone (DESYREL) 150 MG tablet Take 150 mg by mouth at bedtime.     Marland Kitchen zolpidem (AMBIEN) 10 MG tablet Take 10 mg by mouth at bedtime.     Scheduled Inpatient Medications  Continuous Inpatient Infusions . sodium chloride 1,000 mL (01/04/20 1508)  .  sodium chloride    . pantoprozole (PROTONIX) infusion 8 mg/hr (01/04/20 1554)  . potassium chloride     PRN Inpatient Medications    Physical Examination  BP (!) 105/54   Pulse 89   Temp 98.7 F (37.1 C) (Oral)   Resp 19   SpO2 99%  GEN: Resting in bed, looks older than stated age, chronically ill-appearing, but nontoxic  PSYCH: Seems scared and anxious but without pressured speech EYE: Conjunctivae pink, sclerae anicteric ENT: Dry mucous membranes CV: Tachycardic RESP: No audible GI: NABS, soft, tenderness to palpation in midepigastrium and upper abdomen, nondistended, volitional guarding present, no rebound GU: DRE deferred by patient but performed in ED and was FOBT positive MSK/EXT: No significant lower extremity edema SKIN: No jaundice NEURO:  Alert & Oriented x 3, no focal deficits   Review of Data  I reviewed the following data at the time of this encounter:  Laboratory Studies   Recent Labs  Lab 01/04/20 1258  NA 135  K 2.5*  CL 89*  CO2 20*  BUN 87*  CREATININE 6.47*  GLUCOSE 148*  CALCIUM 8.3*   Recent Labs  Lab 01/04/20 1258  AST 16  ALT 15  ALKPHOS 65    Recent Labs  Lab 01/04/20 1258  WBC 22.2*  HGB 7.8*  HCT 25.8*  PLT 496*   Recent Labs  Lab 01/04/20 1258  APTT 36  INR 1.3*   Imaging Studies  01/04/2020 abdominal ultrasound IMPRESSION: Layering gallbladder sludge/small stones with a mildly prominent gallbladder wall. This is nonspecific, could be due to acute cholecystitis. Hepatic steatosis and mild hepatomegaly  GI Procedures and Studies  Patient reports prior colonoscopy years ago in [facility] and does not state that he had prior polyps but he is not absolutely sure   Assessment  [name] is a 74 y.o. male with a pmh significant for asthma/COPD, prior CVA, hypertension, hyperlipidemia, migraines, chart reported PUD (patient does not recall ulcer disease however).  The GI service is consulted for evaluation and  management of Anemia with melena and abdominal pain with associated coffee-ground emesis/hematemesis.   The patient is currently hemodynamically stable.  From a clinical standpoint, he has significant anemia.  Would likely benefit from both an upper and lower endoscopy though I  do not think he is in any way's/shape/form able to undergo colonoscopy preparation at this time.  With his clinical history I suspect peptic ulcer disease as being the most likely etiology for his symptoms worse some sort of upper GI malignancy.  Endoscopic evaluation via EGD will be reasonable.  Hopefully his AKI is prerenal and will continue to improve.  I plan tentatively to proceed with a diagnostic endoscopy tomorrow with therapeutic intent if necessary.  PPI to continue for now.  No evidence on abdominal imaging for significant cirrhosis or splenomegaly so okay to hold on octreotide.  The risks and benefits of endoscopic evaluation were discussed with the patient; these include but are not limited to the risk of perforation, infection, bleeding, missed lesions, lack of diagnosis, severe illness requiring hospitalization, as well as anesthesia and sedation related illnesses.  The patient is agreeable to proceed.    Plan/Recommendations  Okay for clear liquid diet currently Please maintain NPO Status at midnight Plan for tentative EGD on 8/15 Please obtain early AM CBC, INR Hold Heparin/VTE PPx INR Goal <1.8 PPI infusion versus PPI IV twice daily as per medical service   Thank you for this consult.  We will continue to follow.  Please page/call with questions or concerns.   Corliss Parish, MD [facility] Office # [phone]

## 2020-01-04 NOTE — Consult Note (Signed)
 Gastroenterology Inpatient Consultation   Attending Requesting Consult Wentz, Elliott, MD  Hospital Day Hospital Day: 1  Reason for Consult Anemia with melena and abdominal pain with associated coffee-ground emesis/hematemesis   History of Present Illness  [name] is a 74 y.o. male with a pmh significant for asthma/COPD, prior CVA, hypertension, hyperlipidemia, migraines, chart reported PUD (patient does not recall ulcer disease however).  The GI service is consulted for evaluation and management of Anemia with melena and abdominal pain with associated coffee-ground emesis/hematemesis.    The patient is evaluated in the emergency department this evening.  The patient states over the course the last 3 to 4 weeks he has had an approximately 20 to 30 pound weight loss.  His appetite has been significantly worsening.  He has had decreased oral intake.  However, for the last 4 to 5 days he has had increasing episodes of coffee-ground emesis and hematemesis.  He has had diarrhea as well during this course of time.  The stool has been black and tarry appearing over the course the last few days.  Progressive fatigue has been occurring.  As result of the progressive issues that were occurring the patient was brought in for further evaluation.  He was found to have anemia with microcytosis.  Patient does take aspirin on a daily basis.  He also takes BC powders on a regular basis during the course of the week for headaches or joint pains.  The patient was found to have significant acute renal insufficiency as well.  Patient has been given transfusions in effort of trying to optimize his hemodynamics.  GI Review of Systems Positive as above including pyrosis at times, anorexia, early satiety, abdominal pain in the left upper quadrant Negative for dysphagia, odynophagia, hematochezia   Review of Systems  General: Denies fevers/chills HEENT: Denies oral lesions Cardiovascular: Denies chest  pain Pulmonary: Denies shortness of breath Gastroenterological: See HPI Genitourinary: Denies darkened urine or hematuria Hematological: Positive for longstanding easy bruising/bleeding Dermatological: Denies jaundice Psychological: Mood is stable   Histories  Past Medical History Past Medical History:  Diagnosis Date  . Arthritis    "arms, elbows" (11/08/2016)  . Asthma   . COPD (chronic obstructive pulmonary disease) (HCC)   . CVA (cerebral vascular accident) (HCC) 12/2004   and hemorrhagic likely secondary to very uncontrolled hypertension /notes 10/06/2010  . GERD (gastroesophageal reflux disease)   . High cholesterol   . Hypertension   . Migraine    "just started having them in the last couple weeks; qod" (11/08/2016)  . Peptic ulcer disease   . Pneumonia 02/2007   Past Surgical History:  Procedure Laterality Date  . MASS EXCISION Left 01/2003   of the occipital scalp and soft tissue mass of the left face/notes 10/06/2010  . TONSILLECTOMY  1950s  . TRANSURETHRAL RESECTION OF PROSTATE N/A 03/17/2014   Procedure: TRANSURETHRAL RESECTION OF THE PROSTATE (TURP) WITH GYRUS;  Surgeon: Mark C Ottelin, MD;  Location: [facility];  Service: Urology;  Laterality: N/A;  . VIDEO ASSISTED THORACOSCOPY (VATS)/EMPYEMA Right 02/2007    drainage of empyema with decortication /notes 09/22/2010    Allergies No Known Allergies  Family History Family History  Problem Relation Age of Onset  . Cirrhosis Mother   . Cancer Brother    The patient's FH is negative for IBD/IBS/Liver Disease/GI Malignancies.  Social History Social History   Socioeconomic History  . Marital status: Divorced    Spouse name: Not on file  . Number of children:   3  . Years of education: 50  . Highest education level: Not on file  Occupational History    Comment: retired  Tobacco Use  . Smoking status: Former Smoker    Packs/day: 0.50    Years: 40.00    Pack years: 20.00    Types: Cigarettes    Quit date:  10/21/2016    Years since quitting: 3.2  . Smokeless tobacco: Never Used  . Tobacco comment: 05/02/18 5 cigs daily  Vaping Use  . Vaping Use: Never used  Substance and Sexual Activity  . Alcohol use: Not Currently    Alcohol/week: 0.0 standard drinks    Comment: 11/08/2016 "nothing in the last year"  . 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 Sexual activity: Not Currently  Other Topics Concern  . Not on file  Social History Narrative   Lives with his brother   Caffeine- coffee, 1 cup daily   Social Determinants of Health   Financial Resource Strain:   . Difficulty of Paying Living Expenses:   Food Insecurity:   . Worried About Programme researcher, broadcasting/film/video in the Last Year:   . Barista in the Last Year:   Transportation Needs:   . Freight forwarder (Medical):   Marland Kitchen Lack of Transportation (Non-Medical):   Physical Activity:   . Days of Exercise per Week:   . Minutes of Exercise per Session:   Stress:   . Feeling of Stress :   Social Connections:   . Frequency of Communication with Friends and Family:   . Frequency of Social Gatherings with Friends and Family:   . Attends Religious Services:   . Active Member of Clubs or Organizations:   . Attends Banker Meetings:   Marland Kitchen Marital Status:   Intimate Partner Violence:   . Fear of Current or Ex-Partner:   . Emotionally Abused:   Marland Kitchen Physically Abused:   . Sexually Abused:     Medications  Home Medications No current facility-administered medications on file prior to encounter.   Current Outpatient Medications on File Prior to Encounter  Medication Sig Dispense Refill  . acetaminophen (TYLENOL) 325 MG tablet Take 2 tablets (650 mg total) by mouth every 6 (six) hours as needed for mild pain (or Fever >/= 101).    Marland Kitchen albuterol (PROVENTIL HFA;VENTOLIN HFA) 108 (90 Base) MCG/ACT inhaler Inhale 2 puffs into the lungs every 4 (four) hours as needed for wheezing or  shortness of breath. 1 Inhaler 3  . amLODipine-benazepril (LOTREL) 10-40 MG capsule Take 1 capsule by mouth daily.   0  . ASPIRIN LOW DOSE 81 MG EC tablet Take 81 mg by mouth daily.    . Aspirin-Acetaminophen-Caffeine (GOODY HEADACHE PO) Take 1 packet by mouth 2 (two) times daily as needed (headaches).     . Buprenorphine HCl-Naloxone HCl 8-2 MG FILM Place 1 Film under the tongue daily.    . cloNIDine (CATAPRES) 0.1 MG tablet Take 1 tablet (0.1 mg total) by mouth 2 (two) times daily. (Patient taking differently: Take 0.1-0.2 mg by mouth See admin instructions. 0.1 mg in the morning and 0.2 mg at bedtime) 60 tablet 11  . folic acid (FOLVITE) 1 MG tablet Take 1 tablet (1 mg total) by mouth daily. (Patient not taking: Reported on 11/08/2016) 30 tablet 0  . Multiple Vitamin (MULTIVITAMIN WITH MINERALS) TABS tablet Take 1 tablet by mouth daily. (Patient not taking:  Reported on 11/08/2016)    . nitroGLYCERIN (NITROSTAT) 0.4 MG SL tablet Place 0.4 mg under the tongue every 5 (five) minutes x 3 doses as needed for chest pain.    Marland Kitchen omeprazole (PRILOSEC) 40 MG capsule Take 1 capsule (40 mg total) by mouth every morning. 30 capsule 0  . polyethylene glycol (MIRALAX / GLYCOLAX) packet Take 17 g by mouth daily. (Patient not taking: Reported on 05/02/2018) 14 each 0  . SYMBICORT 160-4.5 MCG/ACT inhaler Inhale 2 puffs into the lungs 2 (two) times daily.     . tamsulosin (FLOMAX) 0.4 MG CAPS capsule Take 0.4 mg by mouth daily.    Marland Kitchen thiamine 100 MG tablet Take 1 tablet (100 mg total) by mouth daily. (Patient not taking: Reported on 11/08/2016) 30 tablet 2  . topiramate (TOPAMAX) 50 MG tablet Take 1 tablet (50 mg total) by mouth 2 (two) times daily. 60 tablet 12  . traZODone (DESYREL) 150 MG tablet Take 150 mg by mouth at bedtime.     Marland Kitchen zolpidem (AMBIEN) 10 MG tablet Take 10 mg by mouth at bedtime.     Scheduled Inpatient Medications  Continuous Inpatient Infusions . sodium chloride 1,000 mL (01/04/20 1508)  .  sodium chloride    . pantoprozole (PROTONIX) infusion 8 mg/hr (01/04/20 1554)  . potassium chloride     PRN Inpatient Medications    Physical Examination  BP (!) 105/54   Pulse 89   Temp 98.7 F (37.1 C) (Oral)   Resp 19   SpO2 99%  GEN: Resting in bed, looks older than stated age, chronically ill-appearing, but nontoxic  PSYCH: Seems scared and anxious but without pressured speech EYE: Conjunctivae pink, sclerae anicteric ENT: Dry mucous membranes CV: Tachycardic RESP: No audible GI: NABS, soft, tenderness to palpation in midepigastrium and upper abdomen, nondistended, volitional guarding present, no rebound GU: DRE deferred by patient but performed in ED and was FOBT positive MSK/EXT: No significant lower extremity edema SKIN: No jaundice NEURO:  Alert & Oriented x 3, no focal deficits   Review of Data  I reviewed the following data at the time of this encounter:  Laboratory Studies   Recent Labs  Lab 01/04/20 1258  NA 135  K 2.5*  CL 89*  CO2 20*  BUN 87*  CREATININE 6.47*  GLUCOSE 148*  CALCIUM 8.3*   Recent Labs  Lab 01/04/20 1258  AST 16  ALT 15  ALKPHOS 65    Recent Labs  Lab 01/04/20 1258  WBC 22.2*  HGB 7.8*  HCT 25.8*  PLT 496*   Recent Labs  Lab 01/04/20 1258  APTT 36  INR 1.3*   Imaging Studies  01/04/2020 abdominal ultrasound IMPRESSION: Layering gallbladder sludge/small stones with a mildly prominent gallbladder wall. This is nonspecific, could be due to acute cholecystitis. Hepatic steatosis and mild hepatomegaly  GI Procedures and Studies  Patient reports prior colonoscopy years ago in [facility] and does not state that he had prior polyps but he is not absolutely sure   Assessment  Mr. Terpstra is a 74 y.o. male with a pmh significant for asthma/COPD, prior CVA, hypertension, hyperlipidemia, migraines, chart reported PUD (patient does not recall ulcer disease however).  The GI service is consulted for evaluation and  management of Anemia with melena and abdominal pain with associated coffee-ground emesis/hematemesis.   The patient is currently hemodynamically stable.  From a clinical standpoint, he has significant anemia.  Would likely benefit from both an upper and lower endoscopy though I  do not think he is in any way's/shape/form able to undergo colonoscopy preparation at this time.  With his clinical history I suspect peptic ulcer disease as being the most likely etiology for his symptoms worse some sort of upper GI malignancy.  Endoscopic evaluation via EGD will be reasonable.  Hopefully his AKI is prerenal and will continue to improve.  I plan tentatively to proceed with a diagnostic endoscopy tomorrow with therapeutic intent if necessary.  PPI to continue for now.  No evidence on abdominal imaging for significant cirrhosis or splenomegaly so okay to hold on octreotide.  The risks and benefits of endoscopic evaluation were discussed with the patient; these include but are not limited to the risk of perforation, infection, bleeding, missed lesions, lack of diagnosis, severe illness requiring hospitalization, as well as anesthesia and sedation related illnesses.  The patient is agreeable to proceed.    Plan/Recommendations  Okay for clear liquid diet currently Please maintain NPO Status at midnight Plan for tentative EGD on 8/15 Please obtain early AM CBC, INR Hold Heparin/VTE PPx INR Goal <1.8 PPI infusion versus PPI IV twice daily as per medical service   Thank you for this consult.  We will continue to follow.  Please page/call with questions or concerns.   Corliss Parish, MD [facility] Office # [phone]

## 2020-01-05 ENCOUNTER — Inpatient Hospital Stay (HOSPITAL_COMMUNITY): Payer: Medicare Other | Admitting: Certified Registered"

## 2020-01-05 ENCOUNTER — Encounter (HOSPITAL_COMMUNITY): Payer: Self-pay | Admitting: Internal Medicine

## 2020-01-05 ENCOUNTER — Encounter (HOSPITAL_COMMUNITY)
Admission: EM | Disposition: A | Discharge status: Skilled Nursing Facility | Payer: Self-pay | Source: Home / Self Care | Attending: Internal Medicine

## 2020-01-05 DIAGNOSIS — N179 Acute kidney failure, unspecified: Secondary | ICD-10-CM

## 2020-01-05 DIAGNOSIS — K2091 Esophagitis, unspecified with bleeding: Secondary | ICD-10-CM

## 2020-01-05 HISTORY — PX: BIOPSY: SHX5522

## 2020-01-05 HISTORY — PX: ESOPHAGOGASTRODUODENOSCOPY: SHX5428

## 2020-01-05 LAB — COMPREHENSIVE METABOLIC PANEL
ALT: 13 U/L (ref 0–44)
AST: 16 U/L (ref 15–41)
Albumin: 2.4 g/dL — ABNORMAL LOW (ref 3.5–5.0)
Alkaline Phosphatase: 50 U/L (ref 38–126)
Anion gap: 18 — ABNORMAL HIGH (ref 5–15)
BUN: 194 mg/dL — ABNORMAL HIGH (ref 8–23)
CO2: 18 mmol/L — ABNORMAL LOW (ref 22–32)
Calcium: 8.4 mg/dL — ABNORMAL LOW (ref 8.9–10.3)
Chloride: 107 mmol/L (ref 98–111)
Creatinine, Ser: 3.49 mg/dL — ABNORMAL HIGH (ref 0.61–1.24)
GFR calc Af Amer: 19 mL/min — ABNORMAL LOW (ref >60)
GFR calc non Af Amer: 17 mL/min — ABNORMAL LOW (ref >60)
Glucose, Bld: 116 mg/dL — ABNORMAL HIGH (ref 70–99)
Potassium: 2.9 mmol/L — ABNORMAL LOW (ref 3.5–5.1)
Sodium: 143 mmol/L (ref 135–145)
Total Bilirubin: 0.6 mg/dL (ref 0.3–1.2)
Total Protein: 6.3 g/dL — ABNORMAL LOW (ref 6.5–8.1)

## 2020-01-05 LAB — HEMOGLOBIN AND HEMATOCRIT, BLOOD
HCT: 27.6 % — ABNORMAL LOW (ref 39.0–52.0)
Hemoglobin: 8.6 g/dL — ABNORMAL LOW (ref 13.0–17.0)

## 2020-01-05 LAB — CBC
HCT: 26.7 % — ABNORMAL LOW (ref 39.0–52.0)
Hemoglobin: 8.3 g/dL — ABNORMAL LOW (ref 13.0–17.0)
MCH: 24.2 pg — ABNORMAL LOW (ref 26.0–34.0)
MCHC: 31.1 g/dL (ref 30.0–36.0)
MCV: 77.8 fL — ABNORMAL LOW (ref 80.0–100.0)
Platelets: 349 10*3/uL (ref 150–400)
RBC: 3.43 MIL/uL — ABNORMAL LOW (ref 4.22–5.81)
RDW: 19 % — ABNORMAL HIGH (ref 11.5–15.5)
WBC: 16.4 10*3/uL — ABNORMAL HIGH (ref 4.0–10.5)
nRBC: 0 % (ref 0.0–0.2)

## 2020-01-05 LAB — MRSA PCR SCREENING: MRSA by PCR: POSITIVE — AB

## 2020-01-05 LAB — PROCALCITONIN: Procalcitonin: 2.68 ng/mL

## 2020-01-05 SURGERY — EGD (ESOPHAGOGASTRODUODENOSCOPY)
Anesthesia: Monitor Anesthesia Care

## 2020-01-05 MED ORDER — PANTOPRAZOLE SODIUM 40 MG IV SOLR
40.0000 mg | Freq: Two times a day (BID) | INTRAVENOUS | Status: DC
  Administered 2020-01-06 – 2020-01-08 (×5): 40 mg via INTRAVENOUS
  Filled 2020-01-05 (×5): qty 40

## 2020-01-05 MED ORDER — DICYCLOMINE HCL 10 MG/5ML PO SOLN: 10.0000 mg | Freq: Once | ORAL | Status: DC

## 2020-01-05 MED ORDER — SODIUM CHLORIDE 0.9 % IV SOLN: INTRAVENOUS | Status: DC

## 2020-01-05 MED ORDER — PROPOFOL 500 MG/50ML IV EMUL
INTRAVENOUS | Status: AC
  Filled 2020-01-05: qty 50

## 2020-01-05 MED ORDER — CHLORHEXIDINE GLUCONATE CLOTH 2 % EX PADS
6.0000 | MEDICATED_PAD | Freq: Every day | CUTANEOUS | Status: AC
  Administered 2020-01-04 – 2020-01-07 (×3): 6 via TOPICAL

## 2020-01-05 MED ORDER — ACETAMINOPHEN 325 MG PO TABS
650.0000 mg | ORAL_TABLET | Freq: Four times a day (QID) | ORAL | Status: DC | PRN
  Filled 2020-01-05: qty 2

## 2020-01-05 MED ORDER — MOMETASONE FURO-FORMOTEROL FUM 200-5 MCG/ACT IN AERO
2.0000 | INHALATION_SPRAY | Freq: Two times a day (BID) | RESPIRATORY_TRACT | Status: DC
  Administered 2020-01-05 – 2020-01-17 (×23): 2 via RESPIRATORY_TRACT
  Filled 2020-01-05: qty 8.8

## 2020-01-05 MED ORDER — PROPOFOL 500 MG/50ML IV EMUL
INTRAVENOUS | Status: DC | PRN
  Administered 2020-01-05: 40 ug/kg/min via INTRAVENOUS

## 2020-01-05 MED ORDER — POTASSIUM CHLORIDE 10 MEQ/100ML IV SOLN
10.0000 meq | INTRAVENOUS | Status: AC
  Administered 2020-01-05 (×4): 10 meq via INTRAVENOUS
  Filled 2020-01-05 (×4): qty 100

## 2020-01-05 MED ORDER — LIDOCAINE VISCOUS HCL 2 % MT SOLN: 15.0000 mL | Freq: Once | OROMUCOSAL | Status: DC

## 2020-01-05 MED ORDER — MUPIROCIN 2 % EX OINT
1.0000 "application " | TOPICAL_OINTMENT | Freq: Two times a day (BID) | CUTANEOUS | Status: AC
  Administered 2020-01-05 – 2020-01-09 (×10): 1 via NASAL
  Filled 2020-01-05 (×2): qty 22

## 2020-01-05 MED ORDER — FENTANYL CITRATE (PF) 100 MCG/2ML IJ SOLN
25.0000 ug | INTRAMUSCULAR | Status: DC | PRN
  Administered 2020-01-05: 25 ug via INTRAVENOUS
  Administered 2020-01-07 – 2020-01-09 (×4): 50 ug via INTRAVENOUS
  Filled 2020-01-05 (×5): qty 2

## 2020-01-05 MED ORDER — POTASSIUM CHLORIDE 10 MEQ/100ML IV SOLN
10.0000 meq | INTRAVENOUS | Status: AC
  Administered 2020-01-05 (×3): 10 meq via INTRAVENOUS
  Filled 2020-01-05 (×3): qty 100

## 2020-01-05 MED ORDER — ONDANSETRON HCL 4 MG/2ML IJ SOLN
4.0000 mg | Freq: Once | INTRAMUSCULAR | Status: AC | PRN
  Administered 2020-01-08: 4 mg via INTRAVENOUS
  Filled 2020-01-05: qty 2

## 2020-01-05 MED ORDER — SODIUM CHLORIDE 0.9 % IV SOLN: INTRAVENOUS | Status: DC | PRN

## 2020-01-05 MED ORDER — PHENYLEPHRINE 40 MCG/ML (10ML) SYRINGE FOR IV PUSH (FOR BLOOD PRESSURE SUPPORT)
PREFILLED_SYRINGE | INTRAVENOUS | Status: DC | PRN
  Administered 2020-01-05 (×2): 80 ug via INTRAVENOUS

## 2020-01-05 MED ORDER — BUPRENORPHINE HCL-NALOXONE HCL 8-2 MG SL FILM: 1.0000 | ORAL_FILM | Freq: Every day | SUBLINGUAL | Status: DC

## 2020-01-05 MED ORDER — ALUM & MAG HYDROXIDE-SIMETH 200-200-20 MG/5ML PO SUSP: 30.0000 mL | Freq: Once | ORAL | Status: DC

## 2020-01-05 MED ORDER — SUCRALFATE 1 GM/10ML PO SUSP
1.0000 g | Freq: Four times a day (QID) | ORAL | Status: DC
  Administered 2020-01-05 – 2020-01-17 (×49): 1 g via ORAL
  Filled 2020-01-05 (×46): qty 10

## 2020-01-05 NOTE — Interval H&P Note (Signed)
History and Physical Interval Note:  01/05/2020 9:27 AM  [name]  has presented today for surgery, with the diagnosis of Anemia/Melena.  The various methods of treatment have been discussed with the patient and family. After consideration of risks, benefits and other options for treatment, the patient has consented to  Procedure(s): ESOPHAGOGASTRODUODENOSCOPY (EGD) (N/A) as a surgical intervention.  The patient's history has been reviewed, patient examined, no change in status, stable for surgery.  I have reviewed the patient's chart and labs.  Questions were answered to the patient's satisfaction.     [employer]

## 2020-01-05 NOTE — Transfer of Care (Signed)
Immediate Anesthesia Transfer of Care Note  Patient: [name]  Procedure(s) Performed: ESOPHAGOGASTRODUODENOSCOPY (EGD) (N/A )  Patient Location: PACU  Anesthesia Type:MAC  Level of Consciousness: awake, alert  and oriented  Airway & Oxygen Therapy: Patient Spontanous Breathing and Patient connected to nasal cannula oxygen  Post-op Assessment: Report given to RN and Post -op Vital signs reviewed and stable  Post vital signs: Reviewed and stable  Last Vitals:  Vitals Value Taken Time  BP    Temp    Pulse    Resp    SpO2      Last Pain:  Vitals:   01/05/20 0926  TempSrc: Oral  PainSc: 8       Patients Stated Pain Goal: 4 (09/47/09 6283)  Complications: No complications documented.

## 2020-01-05 NOTE — Op Note (Signed)
[facility] Patient Name: [name] Procedure Date: 01/05/2020 MRN: [id number] Attending MD: Justice Britain , MD Date of Birth: Dec 25, 1945 CSN: [id number] Age: 74 Admit Type: Inpatient Procedure:                Upper GI endoscopy Indications:              Epigastric abdominal pain, Upper abdominal pain,                            Acute post hemorrhagic anemia, Heartburn, Suspected                            esophageal reflux, Melena, Occult blood in stool,                            Suspected upper gastrointestinal bleeding,                            Suspected peptic ulcer, Exclusion of peptic ulcer Providers:                Justice Britain, MD, Barbie Banner, Technician, Anne Fu CRNA, CRNA Referring MD:             [facility] Medicines:                Monitored Anesthesia Care Complications:            No immediate complications. Estimated Blood Loss:     Estimated blood loss was minimal. Procedure:                Pre-Anesthesia Assessment:                           - Prior to the procedure, a History and Physical                            was performed, and patient medications and                            allergies were reviewed. The patient's tolerance of                            previous anesthesia was also reviewed. The risks                            and benefits of the procedure and the sedation                            options and risks were discussed with the patient.                            All questions were answered, and informed consent  was obtained. Prior Anticoagulants: The patient has                            taken no previous anticoagulant or antiplatelet                            agents except for aspirin and BC/Goodie Powders.                            ASA Grade Assessment: III - A patient with severe                             systemic disease. After reviewing the risks and                            benefits, the patient was deemed in satisfactory                            condition to undergo the procedure.                           After obtaining informed consent, the endoscope was                            passed under direct vision. Throughout the                            procedure, the patient's blood pressure, pulse, and                            oxygen saturations were monitored continuously. The                            [id number]-[id number] ([id number]) was introduced through the                            mouth, and advanced to the third part of duodenum.                            The upper GI endoscopy was accomplished without                            difficulty. The patient tolerated the procedure. Scope In: Scope Out: Findings:      LA Grade D (one or more mucosal breaks involving at least 75% of       esophageal circumference) esophagitis with bleeding was found 18 to 35       cm from the incisors (following the UES all the way to the LES).       Biopsies were taken with a cold forceps for histology to rule out       viral/ischemic/malignant processes).      A 3 cm hiatal hernia was present.      Patchy moderate inflammation characterized by erosions, erythema and       granularity was found in the  gastric body, in the gastric antrum and in       the prepyloric region of the stomach. Biopsies were taken with a cold       forceps for histology and Helicobacter pylori testing.      Many non-bleeding cratered duodenal ulcers with a clean ulcer base       (Forrest Class III) were found in the duodenal bulb, in the first       portion of the duodenum and in the second portion of the duodenum. The       largest lesion was 20 mm in largest dimension.      Diffuse moderately congested mucosa without active bleeding and with no       stigmata of bleeding was found in the duodenal bulb, in the first        portion of the duodenum and in the second portion of the duodenum. This       was biopsied with a cold forceps for histology and Helicobacter pylori       testing. Impression:               - LA Grade D esophagitis with bleeding. Biopsied.                           - 3 cm hiatal hernia.                           - Gastritis. Biopsied.                           - Non-bleeding duodenal ulcers with a clean ulcer                            base (Forrest Class III).                           - Congested duodenal mucosa. Biopsied. Moderate Sedation:      Not Applicable - Patient had care per Anesthesia. Recommendation:           - The patient will be observed post-procedure,                            until all discharge criteria are met.                           - Return patient to hospital ward for ongoing care.                           - Patient has a contact number available for                            emergencies. The signs and symptoms of potential                            delayed complications were discussed with the                            patient. Return to normal activities tomorrow.  Written discharge instructions were provided to the                            patient.                           - Full liquid diet.                           - Recommend IV PPI BID x 72 hours due to the                            extensive nature of the esophagitis and the                            duodenal ulcers to aid in healing as much as                            possible. When able then transition to PO PPI BID                            and maintain thereafter.                           - Trend Hgb/Hct.                           - Although, NSAID and Aspirin use is most likely                            etiology for the ulcerations, one could theorize                            some period of potential hypotension or hypovolemia                             that could lead to the severe esophagitis and                            congestion of the duodenum that could be ischemic                            related.                           - Ideally a CTAP with IV/PO Contrast would be                            obtained in effort of trying to better delineate                            that no other issues or intra-abdominal processes  are occuring, but with his AKI I would hold on                            that. Could consider a CTAP with PO contrast but                            hold for now unless absolutely pushed based on                            patient's symptoms.                           - Start Carafate QID (ensure that no medications                            given 1 hour prior or after Carafate is                            administered).                           - PRN GI Cocktail may help with the pain of the                            esophagitis (but be mindful due to the lidocaine                            that too much may be absorbed directly into the                            patient's system due to mucosal integrity being                            compromised).                           - Observe patient's clinical course.                           - Await pathology results.                           - Repeat upper endoscopy in 6 weeks to check                            healing.                           - The findings and recommendations were discussed                            with the patient.                           - The findings and recommendations were discussed  with the referring physician. Procedure Code(s):        --- Professional ---                           [phone], Esophagogastroduodenoscopy, flexible,                            transoral; with biopsy, single or multiple Diagnosis Code(s):        --- Professional ---                            K20.91, Esophagitis, unspecified with bleeding                           K44.9, Diaphragmatic hernia without obstruction or                            gangrene                           K29.70, Gastritis, unspecified, without bleeding                           K26.9, Duodenal ulcer, unspecified as acute or                            chronic, without hemorrhage or perforation                           K31.89, Other diseases of stomach and duodenum                           R10.13, Epigastric pain                           R10.10, Upper abdominal pain, unspecified                           D62, Acute posthemorrhagic anemia                           R12, Heartburn                           K92.1, Melena (includes Hematochezia)                           R19.5, Other fecal abnormalities CPT copyright 2019 American Medical Association. All rights reserved. The codes documented in this report are preliminary and upon coder review may  be revised to meet current compliance requirements. Justice Britain, MD 01/05/2020 10:17:24 AM Number of Addenda: 0

## 2020-01-05 NOTE — Anesthesia Preprocedure Evaluation (Addendum)
Anesthesia Evaluation  Patient identified by MRN, date of birth, ID band Patient awake    Reviewed: Allergy & Precautions, NPO status , Patient's Chart, lab work & pertinent test results  Airway Mallampati: II  TM Distance: >3 FB Neck ROM: Full    Dental  (+) Dental Advisory Given, Lower Dentures, Upper Dentures   Pulmonary asthma , COPD,  COPD inhaler, former smoker,    Pulmonary exam normal breath sounds clear to auscultation       Cardiovascular hypertension, Pt. on medications Normal cardiovascular exam Rhythm:Regular Rate:Normal     Neuro/Psych  Headaches, CVA    GI/Hepatic Neg liver ROS, PUD, GERD  Medicated,Melena   Endo/Other  negative endocrine ROS  Renal/GU Renal disease (AKI)     Musculoskeletal  (+) Arthritis ,   Abdominal   Peds  Hematology  (+) Blood dyscrasia, anemia ,   Anesthesia Other Findings Day of surgery medications reviewed with the patient.  Reproductive/Obstetrics                            Anesthesia Physical Anesthesia Plan  ASA: III  Anesthesia Plan: MAC   Post-op Pain Management:    Induction: Intravenous  PONV Risk Score and Plan: 1 and Propofol infusion and Treatment may vary due to age or medical condition  Airway Management Planned: Nasal Cannula and Natural Airway  Additional Equipment:   Intra-op Plan:   Post-operative Plan:   Informed Consent: I have reviewed the patients History and Physical, chart, labs and discussed the procedure including the risks, benefits and alternatives for the proposed anesthesia with the patient or authorized representative who has indicated his/her understanding and acceptance.     Dental advisory given  Plan Discussed with: CRNA and Anesthesiologist  Anesthesia Plan Comments:         Anesthesia Quick Evaluation

## 2020-01-05 NOTE — Anesthesia Postprocedure Evaluation (Signed)
Anesthesia Post Note  Patient: Chaise Mahabir  Procedure(s) Performed: ESOPHAGOGASTRODUODENOSCOPY (EGD) (N/A ) BIOPSY     Patient location during evaluation: PACU Anesthesia Type: MAC Level of consciousness: awake and alert Pain management: pain level controlled Vital Signs Assessment: post-procedure vital signs reviewed and stable Respiratory status: spontaneous breathing, nonlabored ventilation, respiratory function stable and patient connected to nasal cannula oxygen Cardiovascular status: stable and blood pressure returned to baseline Postop Assessment: no apparent nausea or vomiting Anesthetic complications: no   No complications documented.  Last Vitals:  Vitals:   01/05/20 1226 01/05/20 1230  BP:  (!) 125/59  Pulse:  (!) 101  Resp:  17  Temp:    SpO2: 99% 99%    Last Pain:  Vitals:   01/05/20 1226  TempSrc:   PainSc: 0-No pain                 Catalina Gravel

## 2020-01-05 NOTE — Progress Notes (Signed)
PROGRESS NOTE    [name]  QVZ:[id number] DOB: 1945/09/12 DOA: 01/04/2020 PCP: [name], MD   Brief Narrative: [name] is a 74 y.o. male with medical history significant of peptic ulcer disease admitted with nausea vomiting and black stools.  Patient admitted with nausea vomiting and diarrhea for the past 4 days prior to admission to hospital. Associated with this he also complains of abdominal discomfort. He had subjective fever at home though he did not check his temperature at home. Denies any cough. He did not have Covid in the past year. He denies any urinary complaints. He does complain of shortness of breath and dyspnea on exertion he reports occasional chest pain. He reports he has not been able to eat or drink anything for the last 4 days not been able to keep it down as he was vomiting. He takes Goody powder and aspirin and multiple antihypertensives at home. He is also on Suboxone at home. ED Course: His potassium was 2.5 sodium was 135 BUN 87 creatinine 6.47 anion gap was 26, white count 22.2, hemoglobin 7.8 platelets 496 INR 1.3 Blood pressure 95/56 came up to 105/ 54 heart rate 90 respiration 19 sats 99% on 2 L temperature 98.7. He was seen by PCCM in the ER and asked hospitalist to admit the patient.  Assessment & Plan:   Active Problems:   UGI bleed   Hypokalemia   Leukocytosis   #1 upper GI bleed EGD shows esophagitis with bleeding which was biopsied.  Hiatal hernia and gastritis.  Nonbleeding duodenal ulcer with a clean ulcer base.  Congested duodenal mucosa. Continue PPI drip. Continue Carafate. Hold any blood thinners aspirin Goody powder etc.  #2 AKI improving with IV fluids.  #3 leukocytosis improving hold off on starting any antibiotics no localized infection noted  #4 hypokalemia replete and recheck levels in a.m.  #5 high gap acidosis due to AKI resolving  #6 urinary retention with history of BPH Foley catheter placed in the  ER  #7 history of polysubstance abuse urine drug screen positive for cocaine patient on Suboxone at home  #8 hypotension most likely secondary to GI bleed with dehydration ongoing nausea vomiting and diarrhea prior to admission to hospital, he is also on multiple antihypertensives at home which he has been taking. No evidence of infection identified so far.  Urine cultures and blood cultures pending  Estimated body mass index is 24.17 kg/m as calculated from the following:   Height as of 05/02/18: 6' (1.829 m).   Weight as of 05/02/18: 80.8 kg.  DVT prophylaxis: SCD  code Status full code  family Communication: None at bedside Disposition Plan:  Status is: Inpatient  Dispo: The patient is from: Home              Anticipated d/c is to: Home              Anticipated d/c date is more than 2 days              Patient currently is not medically stable to d/c.  Patient admitted with acute GI bleed on Protonix drip    Consultants:   GI  Procedures: EGD 01/05/2020 Antimicrobials: None  Subjective: Resting in bed reports he is not well has not had any further nausea vomiting or diarrhea  Objective: Vitals:   01/05/20 0900 01/05/20 0926 01/05/20 1017 01/05/20 1030  BP: (!) 124/52 (!) 121/47 (!) 105/59 (!) 122/54  Pulse: 86 100 88 90  Resp: Marland Kitchen)  21 18 20 20   Temp:  99.4 F (37.4 C) 98.5 F (36.9 C)   TempSrc:  Oral    SpO2: 100% 100% 100% 98%    Intake/Output Summary (Last 24 hours) at 01/05/2020 1039 Last data filed at 01/05/2020 1020 Gross per 24 hour  Intake 4703.3 ml  Output 3100 ml  Net 1603.3 ml   There were no vitals filed for this visit.  Examination:  General exam: Appears calm and comfortable  Respiratory system: Clear to auscultation. Respiratory effort normal. Cardiovascular system: S1 & S2 heard, RRR. No JVD, murmurs, rubs, gallops or clicks. No pedal edema. Gastrointestinal system: Abdomen is nondistended, soft and nontender. No organomegaly or masses felt.  Normal bowel sounds heard. Central nervous system: Alert and oriented. No focal neurological deficits. Extremities: Symmetric 5 x 5 power. Skin: No rashes, lesions or ulcers Psychiatry: Judgement and insight appear normal. Mood & affect appropriate.     Data Reviewed: I have personally reviewed following labs and imaging studies  CBC: Recent Labs  Lab 01/04/20 1258 01/05/20 0237  WBC 22.2* 16.4*  NEUTROABS 18.7*  --   HGB 7.8* 8.3*  HCT 25.8* 26.7*  MCV 72.9* 77.8*  PLT 496* 349   Basic Metabolic Panel: Recent Labs  Lab 01/04/20 1258 01/05/20 0237  NA 135 143  K 2.5* 2.9*  CL 89* 107  CO2 20* 18*  GLUCOSE 148* 116*  BUN 87* 194*  CREATININE 6.47* 3.49*  CALCIUM 8.3* 8.4*   GFR: CrCl cannot be calculated (Unknown ideal weight.). Liver Function Tests: Recent Labs  Lab 01/04/20 1258 01/05/20 0237  AST 16 16  ALT 15 13  ALKPHOS 65 50  BILITOT 0.6 0.6  PROT 7.5 6.3*  ALBUMIN 2.7* 2.4*   Recent Labs  Lab 01/04/20 1258  LIPASE 26   No results for input(s): AMMONIA in the last 168 hours. Coagulation Profile: Recent Labs  Lab 01/04/20 1258  INR 1.3*   Cardiac Enzymes: No results for input(s): CKTOTAL, CKMB, CKMBINDEX, TROPONINI in the last 168 hours. BNP (last 3 results) No results for input(s): PROBNP in the last 8760 hours. HbA1C: No results for input(s): HGBA1C in the last 72 hours. CBG: No results for input(s): GLUCAP in the last 168 hours. Lipid Profile: No results for input(s): CHOL, HDL, LDLCALC, TRIG, CHOLHDL, LDLDIRECT in the last 72 hours. Thyroid Function Tests: No results for input(s): TSH, T4TOTAL, FREET4, T3FREE, THYROIDAB in the last 72 hours. Anemia Panel: Recent Labs    01/04/20 1258  VITAMINB12 332  FOLATE 12.7  FERRITIN 207  TIBC 315  IRON 68   Sepsis Labs: Recent Labs  Lab 01/04/20 1301 01/05/20 0237  PROCALCITON  --  2.68  LATICACIDVEN 1.8  --     Recent Results (from the past 240 hour(s))  SARS Coronavirus 2 by  RT PCR (hospital order, performed in [facility] hospital lab) Nasopharyngeal Nasopharyngeal Swab     Status: None   Collection Time: 01/04/20 12:58 PM   Specimen: Nasopharyngeal Swab  Result Value Ref Range Status   SARS Coronavirus 2 NEGATIVE NEGATIVE Final    Comment: (NOTE) SARS-CoV-2 target nucleic acids are NOT DETECTED.  The SARS-CoV-2 RNA is generally detectable in upper and lower respiratory specimens during the acute phase of infection. The lowest concentration of SARS-CoV-2 viral copies this assay can detect is 250 copies / mL. A negative result does not preclude SARS-CoV-2 infection and should not be used as the sole basis for treatment or other patient management decisions.  A  negative result may occur with improper specimen collection / handling, submission of specimen other than nasopharyngeal swab, presence of viral mutation(s) within the areas targeted by this assay, and inadequate number of viral copies (<250 copies / mL). A negative result must be combined with clinical observations, patient history, and epidemiological information.  Fact Sheet for Patients:   BoilerBrush.com.cyhttps://www.fda.gov/media/136312/download  Fact Sheet for Healthcare Providers: https://pope.com/https://www.fda.gov/media/136313/download  This test is not yet approved or  cleared by the [place] FDA and has been authorized for detection and/or diagnosis of SARS-CoV-2 by FDA under an Emergency Use Authorization (EUA).  This EUA will remain in effect (meaning this test can be used) for the duration of the COVID-19 declaration under Section 564(b)(1) of the Act, 21 U.S.C. section 360bbb-3(b)(1), unless the authorization is terminated or revoked sooner.  Performed at [facility], [address] [place], [place] [address]   MRSA PCR Screening     Status: Abnormal   Collection Time: 01/04/20 11:28 PM   Specimen: Nasopharyngeal  Result Value Ref Range Status   MRSA by PCR POSITIVE (A) NEGATIVE  Final    Comment:        The GeneXpert MRSA Assay (FDA approved for NASAL specimens only), is one component of a comprehensive MRSA colonization surveillance program. It is not intended to diagnose MRSA infection nor to guide or monitor treatment for MRSA infections. RESULT CALLED TO, READ BACK BY AND VERIFIED WITH: A,ROTHERMEL AT 0118 ON 01/05/20 BY A,MOHAMED Performed at [facility], [address] [place], [place] [address]          Radiology Studies: DG Chest 2 View  Result Date: 01/04/2020 CLINICAL DATA:  Repeat chest x-ray with nipple markers placed. EXAM: CHEST - 2 VIEW COMPARISON:  January 04, 2020 (1:25 p.m.) FINDINGS: There is no evidence of acute infiltrate. Bilateral nipple markers are noted. There is a small right pleural effusion. No pneumothorax is seen. The heart size and mediastinal contours are within normal limits. The visualized skeletal structures are unremarkable. IMPRESSION: 1. Small right pleural effusion. 2. Bilateral nipple markers without evidence of pneumothorax. Electronically Signed   By: Aram Candelahaddeus  Houston M.D.   On: 01/04/2020 18:31   US Abdomen Complete  Result Date: 01/04/2020 CLINICAL DATA:  Abdominal pain EXAM: ABDOMEN ULTRASOUND COMPLETE COMPARISON:  None. FINDINGS: Gallbladder: The gallbladder is distended layering sludge/small stones. No sonographic Murphy sign seen. There is a mildly prominent gallbladder wall measuring up to 3.5 mm Common bile duct: Diameter: 5.3 mm Liver: Increased echotexture seen throughout. No focal abnormality or biliary ductal dilatation. The liver is enlarged measuring 19 cm in craniocaudad dimension. Portal vein is patent on color Doppler imaging with normal direction of blood flow towards the liver. IVC: No abnormality visualized. Pancreas: Visualized portion unremarkable. Spleen: Size and appearance within normal limits. Right Kidney: Length: 11.3 cm. Echogenicity within normal limits. No mass or  hydronephrosis visualized. Left Kidney: Length: 12.0cm. Echogenicity within normal limits. No mass or hydronephrosis visualized. Abdominal aorta: No aneurysm visualized. Other findings: None. IMPRESSION: Layering gallbladder sludge/small stones with a mildly prominent gallbladder wall. This is nonspecific, could be due to acute cholecystitis. Hepatic steatosis and mild hepatomegaly Electronically Signed   By: Jonna ClarkBindu  Avutu M.D.   On: 01/04/2020 16:40   DG Chest Port 1 View  Result Date: 01/04/2020 CLINICAL DATA:  Vomiting for 1 week. EXAM: PORTABLE CHEST 1 VIEW COMPARISON:  11/08/2016 FINDINGS: Nodular prominence along the right diaphragmatic surface. No focal consolidation. No pleural effusion or pneumothorax.  Heart and mediastinal contours are unremarkable. No acute osseous abnormality. IMPRESSION: 1. No acute cardiopulmonary disease. 2. Nodular prominence along the right diaphragmatic surface which may reflect a prominent nipple shadow or a true pulmonary nodule. Recommend further evaluation with a lateral view of the chest and a frontal view with a nipple markers. Electronically Signed   By: Elige Ko   On: 01/04/2020 14:07        Scheduled Meds: . [MAR Hold] sodium chloride   Intravenous Once  . alum & mag hydroxide-simeth  30 mL Oral Once   And  . lidocaine  15 mL Oral Once  . [MAR Hold] Chlorhexidine Gluconate Cloth  6 each Topical Daily  . [MAR Hold] Chlorhexidine Gluconate Cloth  6 each Topical Q0600  . dicyclomine  10 mg Oral Once  . [MAR Hold] mouth rinse  15 mL Mouth Rinse BID  . [MAR Hold] mupirocin ointment  1 application Nasal BID  . [START ON 01/06/2020] pantoprazole (PROTONIX) IV  40 mg Intravenous Q12H  . sucralfate  1 g Oral QID  . [MAR Hold] tamsulosin  0.4 mg Oral Daily   Continuous Infusions: . sodium chloride Stopped (01/05/20 0704)  . sodium chloride 125 mL/hr at 01/05/20 1029  . sodium chloride    . sodium chloride 125 mL/hr at 01/05/20 1030  . pantoprozole  (PROTONIX) infusion 8 mg/hr (01/05/20 1029)     LOS: 1 day     Alwyn Ren, MD 01/05/2020, 10:39 AM

## 2020-01-06 ENCOUNTER — Inpatient Hospital Stay (HOSPITAL_COMMUNITY)
Admit: 2020-01-06 | Discharge: 2020-01-06 | Disposition: A | Discharge status: Home / Self Care | Payer: Medicare Other | Attending: Internal Medicine | Admitting: Internal Medicine

## 2020-01-06 DIAGNOSIS — K269 Duodenal ulcer, unspecified as acute or chronic, without hemorrhage or perforation: Secondary | ICD-10-CM | POA: Diagnosis not present

## 2020-01-06 DIAGNOSIS — E440 Moderate protein-calorie malnutrition: Secondary | ICD-10-CM | POA: Insufficient documentation

## 2020-01-06 DIAGNOSIS — K209 Esophagitis, unspecified without bleeding: Secondary | ICD-10-CM | POA: Diagnosis not present

## 2020-01-06 DIAGNOSIS — R569 Unspecified convulsions: Secondary | ICD-10-CM

## 2020-01-06 LAB — COMPREHENSIVE METABOLIC PANEL
ALT: 13 U/L (ref 0–44)
AST: 15 U/L (ref 15–41)
Albumin: 2.5 g/dL — ABNORMAL LOW (ref 3.5–5.0)
Alkaline Phosphatase: 49 U/L (ref 38–126)
Anion gap: 15 (ref 5–15)
BUN: 84 mg/dL — ABNORMAL HIGH (ref 8–23)
CO2: 19 mmol/L — ABNORMAL LOW (ref 22–32)
Calcium: 8.7 mg/dL — ABNORMAL LOW (ref 8.9–10.3)
Chloride: 120 mmol/L — ABNORMAL HIGH (ref 98–111)
Creatinine, Ser: 1.41 mg/dL — ABNORMAL HIGH (ref 0.61–1.24)
GFR calc Af Amer: 58 mL/min — ABNORMAL LOW (ref >60)
GFR calc non Af Amer: 50 mL/min — ABNORMAL LOW (ref >60)
Glucose, Bld: 115 mg/dL — ABNORMAL HIGH (ref 70–99)
Potassium: 2.8 mmol/L — ABNORMAL LOW (ref 3.5–5.1)
Sodium: 154 mmol/L — ABNORMAL HIGH (ref 135–145)
Total Bilirubin: 0.8 mg/dL (ref 0.3–1.2)
Total Protein: 6.5 g/dL (ref 6.5–8.1)

## 2020-01-06 LAB — CBC
HCT: 25.2 % — ABNORMAL LOW (ref 39.0–52.0)
Hemoglobin: 7.6 g/dL — ABNORMAL LOW (ref 13.0–17.0)
MCH: 24.1 pg — ABNORMAL LOW (ref 26.0–34.0)
MCHC: 30.2 g/dL (ref 30.0–36.0)
MCV: 80 fL (ref 80.0–100.0)
Platelets: 341 10*3/uL (ref 150–400)
RBC: 3.15 MIL/uL — ABNORMAL LOW (ref 4.22–5.81)
RDW: 19.3 % — ABNORMAL HIGH (ref 11.5–15.5)
WBC: 12.9 10*3/uL — ABNORMAL HIGH (ref 4.0–10.5)
nRBC: 0 % (ref 0.0–0.2)

## 2020-01-06 LAB — PROCALCITONIN: Procalcitonin: 0.91 ng/mL

## 2020-01-06 LAB — MAGNESIUM: Magnesium: 1.6 mg/dL — ABNORMAL LOW (ref 1.7–2.4)

## 2020-01-06 LAB — TROPONIN I (HIGH SENSITIVITY)
Troponin I (High Sensitivity): 67 ng/L — ABNORMAL HIGH (ref <18)
Troponin I (High Sensitivity): 63 ng/L — ABNORMAL HIGH (ref <18)

## 2020-01-06 LAB — PHOSPHORUS: Phosphorus: 3 mg/dL (ref 2.5–4.6)

## 2020-01-06 MED ORDER — POTASSIUM CHLORIDE 10 MEQ/100ML IV SOLN
10.0000 meq | INTRAVENOUS | Status: AC
  Administered 2020-01-06 (×5): 10 meq via INTRAVENOUS
  Filled 2020-01-06 (×5): qty 100

## 2020-01-06 MED ORDER — ADULT MULTIVITAMIN W/MINERALS CH
1.0000 | ORAL_TABLET | Freq: Every day | ORAL | Status: DC
  Administered 2020-01-06 – 2020-01-17 (×12): 1 via ORAL
  Filled 2020-01-06 (×12): qty 1

## 2020-01-06 MED ORDER — MAGNESIUM SULFATE 2 GM/50ML IV SOLN
2.0000 g | Freq: Once | INTRAVENOUS | Status: AC
  Administered 2020-01-06: 2 g via INTRAVENOUS
  Filled 2020-01-06: qty 50

## 2020-01-06 MED ORDER — SODIUM CHLORIDE 0.45 % IV SOLN: INTRAVENOUS | Status: DC

## 2020-01-06 MED ORDER — KATE FARMS STANDARD 1.4 PO LIQD
325.0000 mL | Freq: Two times a day (BID) | ORAL | Status: DC
  Administered 2020-01-06 – 2020-01-17 (×18): 325 mL via ORAL
  Filled 2020-01-06 (×23): qty 325

## 2020-01-06 NOTE — Progress Notes (Signed)
EEG Completed; Results Pending  

## 2020-01-06 NOTE — Progress Notes (Signed)
Patient ID: [name], male   DOB: 31-May-1945, 74 y.o.   MRN: [id number]    Progress Note   Subjective   day # 2  CC GI bleed  EGD yesterday-grade D esophagitis, 3 cm hiatal hernia, patchy moderate gastritis, multiple nonbleeding cratered duodenal ulcers in the duodenal bulb, the largest 20 mm, diffuse moderately congested mucosa without active bleeding, biopsies done-pending Hemoglobin 7.6 this a.m. Potassium 2.8 BUN 84/creatinine 1.41 improved  Patient having EEG currently  He says his abdominal pain is about 8 out of 10 and constant, was 10 out of 10 on admit.  He has been able to take some liquids. Nurses report no melena.     Objective   Vital signs in last 24 hours: Temp:  [98.5 F (36.9 C)-100.2 F (37.9 C)] 100.2 F (37.9 C) (08/16 0729) Pulse Rate:  [81-106] 90 (08/16 0600) Resp:  [17-30] 23 (08/16 0600) BP: (96-162)/(44-69) 123/52 (08/16 0600) SpO2:  [89 %-100 %] 100 % (08/16 0828)   General:    Older African-American male in NAD Heart:  Regular rate and rhythm; no murmurs Lungs: Respirations even and unlabored, lungs CTA bilaterally Abdomen:  Soft, tender in the epigastrium and nondistended. Normal bowel sounds. Extremities:  Without edema. Neurologic:  Alert and oriented,  grossly normal neurologically. Psych:  Cooperative. Normal mood and affect.  Intake/Output from previous day: 08/15 0701 - 08/16 0700 In: 4593.1 [P.O.:360; I.V.:3549.8; IV Piggyback:683.3] Out: 4925 [Urine:4925] Intake/Output this shift: No intake/output data recorded.  Lab Results: Recent Labs    01/04/20 1258 01/04/20 1258 01/05/20 0237 01/05/20 1940 01/06/20 0308  WBC 22.2*  --  16.4*  --  12.9*  HGB 7.8*   < > 8.3* 8.6* 7.6*  HCT 25.8*   < > 26.7* 27.6* 25.2*  PLT 496*  --  349  --  341   < > = values in this interval not displayed.   BMET Recent Labs    01/04/20 1258 01/05/20 0237 01/06/20 0308  NA 135 143 154*  K 2.5* 2.9* 2.8*  CL 89* 107 120*  CO2 20*  18* 19*  GLUCOSE 148* 116* 115*  BUN 87* 194* 84*  CREATININE 6.47* 3.49* 1.41*  CALCIUM 8.3* 8.4* 8.7*   LFT Recent Labs    01/06/20 0308  PROT 6.5  ALBUMIN 2.5*  AST 15  ALT 13  ALKPHOS 49  BILITOT 0.8   PT/INR Recent Labs    01/04/20 1258  LABPROT 16.0*  INR 1.3*    Studies/Results: DG Chest 2 View  Result Date: 01/04/2020 CLINICAL DATA:  Repeat chest x-ray with nipple markers placed. EXAM: CHEST - 2 VIEW COMPARISON:  January 04, 2020 (1:25 p.m.) FINDINGS: There is no evidence of acute infiltrate. Bilateral nipple markers are noted. There is a small right pleural effusion. No pneumothorax is seen. The heart size and mediastinal contours are within normal limits. The visualized skeletal structures are unremarkable. IMPRESSION: 1. Small right pleural effusion. 2. Bilateral nipple markers without evidence of pneumothorax. Electronically Signed   By: Aram Candela M.D.   On: 01/04/2020 18:31   US Abdomen Complete  Result Date: 01/04/2020 CLINICAL DATA:  Abdominal pain EXAM: ABDOMEN ULTRASOUND COMPLETE COMPARISON:  None. FINDINGS: Gallbladder: The gallbladder is distended layering sludge/small stones. No sonographic Murphy sign seen. There is a mildly prominent gallbladder wall measuring up to 3.5 mm Common bile duct: Diameter: 5.3 mm Liver: Increased echotexture seen throughout. No focal abnormality or biliary ductal dilatation. The liver is enlarged measuring 19 cm in  craniocaudad dimension. Portal vein is patent on color Doppler imaging with normal direction of blood flow towards the liver. IVC: No abnormality visualized. Pancreas: Visualized portion unremarkable. Spleen: Size and appearance within normal limits. Right Kidney: Length: 11.3 cm. Echogenicity within normal limits. No mass or hydronephrosis visualized. Left Kidney: Length: 12.0cm. Echogenicity within normal limits. No mass or hydronephrosis visualized. Abdominal aorta: No aneurysm visualized. Other findings: None.  IMPRESSION: Layering gallbladder sludge/small stones with a mildly prominent gallbladder wall. This is nonspecific, could be due to acute cholecystitis. Hepatic steatosis and mild hepatomegaly Electronically Signed   By: Jonna Clark M.D.   On: 01/04/2020 16:40   DG Chest Port 1 View  Result Date: 01/04/2020 CLINICAL DATA:  Vomiting for 1 week. EXAM: PORTABLE CHEST 1 VIEW COMPARISON:  11/08/2016 FINDINGS: Nodular prominence along the right diaphragmatic surface. No focal consolidation. No pleural effusion or pneumothorax. Heart and mediastinal contours are unremarkable. No acute osseous abnormality. IMPRESSION: 1. No acute cardiopulmonary disease. 2. Nodular prominence along the right diaphragmatic surface which may reflect a prominent nipple shadow or a true pulmonary nodule. Recommend further evaluation with a lateral view of the chest and a frontal view with a nipple markers. Electronically Signed   By: Elige Ko   On: 01/04/2020 14:07       Assessment / Plan:    #68 74 year old African-American male, admitted with abdominal pain, anemia, melena and history of coffee-ground emesis.  EGD yesterday with finding of numerous duodenal ulcers, clean base with no active bleeding. Biopsies pending  No active bleeding, hemoglobin has drifted a bit  Patient had been taking a lot of BC powders for chronic headaches and also on aspirin daily  #2 history of CVA #3.  COPD #4.  Chronic headaches #5 history of polysubstance abuse, on Suboxone  Plan; continue full liquids Continue IV PPI infusion x72 hours Follow-up biopsies Stop BC powders.  Patient will need alternative regimen for his chronic headaches GI will follow up in a.m.           Active Problems:   Gastrointestinal hemorrhage   Hypokalemia   Leukocytosis     LOS: 2 days   Darcie Mellone EsterwoodPA-C  01/06/2020, 8:43 AM

## 2020-01-06 NOTE — TOC Initial Note (Signed)
Transition of Care [facility]) - Initial/Assessment Note    Patient Details  Name: [name] MRN: [id number] Date of Birth: May 22, 1946  Transition of [facility]) CM/SW Contact:    Golda Acre, RN Phone Number: 01/06/2020, 9:55 AM  Clinical Narrative:                 Gi bleed due to peptic ulcer disease. Iv protonix, hgb was 8.9 now is 7.6. Following for progression and toc needs.  From home has pcp plan is to return to home.  Expected Discharge Plan: Home/Self Care Barriers to Discharge: Continued Medical Work up   Patient Goals and CMS Choice Patient states their goals for this hospitalization and ongoing recovery are:: i would like to go home CMS Medicare.gov Compare Post Acute Care list provided to:: Patient    Expected Discharge Plan and Services Expected Discharge Plan: Home/Self Care   Discharge Planning Services: CM Consult   Living arrangements for the past 2 months: Single Family Home                                      Prior Living Arrangements/Services Living arrangements for the past 2 months: Single Family Home Lives with:: Self Patient language and need for interpreter reviewed:: Yes Do you feel safe going back to the place where you live?: Yes      Need for Family Participation in Patient Care: Yes (Comment) Care giver support system in place?: Yes (comment)   Criminal Activity/Legal Involvement Pertinent to Current Situation/Hospitalization: No - Comment as needed  Activities of Daily Living   ADL Screening (condition at time of admission) Patient's cognitive ability adequate to safely complete daily activities?: Yes Is the patient deaf or have difficulty hearing?: No Does the patient have difficulty seeing, even when wearing glasses/contacts?: No Does the patient have difficulty concentrating, remembering, or making decisions?: No Patient able to express need for assistance with ADLs?: Yes Does the patient have difficulty dressing  or bathing?: No Independently performs ADLs?: Yes (appropriate for developmental age) Does the patient have difficulty walking or climbing stairs?: No Weakness of Legs: None Weakness of Arms/Hands: None  Permission Sought/Granted                  Emotional Assessment Appearance:: Appears stated age Attitude/Demeanor/Rapport: Engaged Affect (typically observed): Calm Orientation: : Oriented to Self, Oriented to Place, Oriented to  Time, Oriented to Situation Alcohol / Substance Use: Not Applicable Psych Involvement: No (comment)  Admission diagnosis:  Hypokalemia [E87.6] SOB (shortness of breath) [R06.02] UGI bleed [K92.2] Elevated serum creatinine [R79.89] AKI (acute kidney injury) (HCC) [N17.9] Hypotension [I95.9] Gastrointestinal hemorrhage, unspecified gastrointestinal hemorrhage type [K92.2] Patient Active Problem List   Diagnosis Date Noted  . Gastrointestinal hemorrhage 01/04/2020  . Hypokalemia 01/04/2020  . Leukocytosis 01/04/2020  . Frontal headache   . Hypertension 11/08/2016  . COPD (chronic obstructive pulmonary disease) (HCC) 11/08/2016  . GERD (gastroesophageal reflux disease) 11/08/2016  . Acute kidney injury (HCC) 11/08/2016  . BPH (benign prostatic hyperplasia) 11/08/2016  . Polysubstance dependence (HCC) 11/08/2016  . Hip pain, bilateral 09/17/2014  . AKI (acute kidney injury) (HCC) 09/10/2014  . Orthostatic hypotension 09/10/2014  . Nausea and vomiting 09/10/2014  . Polysubstance abuse (HCC) 09/10/2014  . Syncope 09/09/2014  . Peptic ulcer disease   . Benign prostatic hypertrophy (BPH) with incomplete bladder emptying 03/17/2014  . CHEST PAIN, RIGHT 05/23/2007  . Essential  hypertension 04/05/2007  . NON-HEALING SURGICAL WOUND NEC 03/19/2007  . METHICILLIN-RESISTANT STAPHYLOCOCCUS AUREUS PNEUMONIA 03/15/2007  . EMPYEMA 03/15/2007   PCP:  [name], MD Pharmacy:   [facility] [phone], [place] - [address] [place] [address] Phone: [phone] Fax: [phone]     Social Determinants of Health (SDOH) Interventions    Readmission Risk Interventions No flowsheet data found.

## 2020-01-06 NOTE — Progress Notes (Signed)
PROGRESS NOTE    [name]  RCV:[id number] DOB: 1946-05-01 DOA: 01/04/2020 PCP: [name], MD   Brief Narrative: [name] is a 74 y.o. male with medical history significant of peptic ulcer disease admitted with nausea vomiting and black stools.  Patient admitted with nausea vomiting and diarrhea for the past 4 days prior to admission to hospital. Associated with this he also complains of abdominal discomfort. He had subjective fever at home though he did not check his temperature at home. Denies any cough. He did not have Covid in the past year. He denies any urinary complaints. He does complain of shortness of breath and dyspnea on exertion he reports occasional chest pain. He reports he has not been able to eat or drink anything for the last 4 days not been able to keep it down as he was vomiting. He takes Goody powder and aspirin and multiple antihypertensives at home. He is also on Suboxone at home. ED Course: His potassium was 2.5 sodium was 135 BUN 87 creatinine 6.47 anion gap was 26, white count 22.2, hemoglobin 7.8 platelets 496 INR 1.3 Blood pressure 95/56 came up to 105/ 54 heart rate 90 respiration 19 sats 99% on 2 L temperature 98.7. He was seen by PCCM in the ER and asked hospitalist to admit the patient.  Assessment & Plan:   Active Problems:   Gastrointestinal hemorrhage   Hypokalemia   Leukocytosis   #1 upper GI bleed EGD shows esophagitis with bleeding which was biopsied.  Hiatal hernia and gastritis.  Nonbleeding duodenal ulcer with a clean ulcer base.  Congested duodenal mucosa. Continue PPI drip. Continue Carafate. Hold any blood thinners aspirin Goody powder etc.  #2 AKI improving with IV fluids.creatinine 1.41 down from 6.47 on admit  #3 leukocytosis improving hold off on starting any antibiotics no localized infection noted Wbc 12.9 down from 22 on admit  #4 hypokalemia replete and recheck levels in a.m.check mag  #5 high gap  acidosis due to AKI resolved   #6 urinary retention with history of BPH Foley catheter placed in the ER.dc foley   #7 history of polysubstance abuse urine drug screen positive for cocaine patient on Suboxone at home  #8 hypotension resolved  most likely secondary to GI bleed with dehydration ongoing nausea vomiting and diarrhea prior to admission to hospital, he is also on multiple antihypertensives at home which he has been taking. No evidence of infection identified so far. Blood culture ordered on admit but not done.   #9 Hypernatremia- dc NS START half ns.  Estimated body mass index is 24.17 kg/m as calculated from the following:   Height as of 05/02/18: 6' (1.829 m).   Weight as of 05/02/18: 80.8 kg.  DVT prophylaxis: SCD  code Status full code  family Communication: None at bedside Disposition Plan:  Status is: Inpatient  Dispo: The patient is from: Home              Anticipated d/c is to: Home              Anticipated d/c date is more than 2 days              Patient currently is not medically stable to d/c.  Patient admitted with acute GI bleed on Protonix drip    Consultants:   GI  Procedures: EGD 01/05/2020 Antimicrobials: None  Subjective: No events overnight  Resting in bed  On clears  No bms no nausea Objective: Vitals:   01/06/20  0500 01/06/20 0600 01/06/20 0729 01/06/20 0828  BP: (!) 130/55 (!) 123/52    Pulse: 92 90    Resp:  (!) 23    Temp:   100.2 F (37.9 C)   TempSrc:   Oral   SpO2: 100% 100%  100%    Intake/Output Summary (Last 24 hours) at 01/06/2020 0901 Last data filed at 01/06/2020 0600 Gross per 24 hour  Intake 4039.75 ml  Output 4925 ml  Net -885.25 ml   There were no vitals filed for this visit.  Examination:  General exam: Appears calm and comfortable  Respiratory system: Clear to auscultation. Respiratory effort normal. Cardiovascular system: S1 & S2 heard, RRR. No JVD, murmurs, rubs, gallops or clicks. No pedal  edema. Gastrointestinal system: Abdomen is nondistended, soft and nontender. No organomegaly or masses felt. Normal bowel sounds heard. Central nervous system: Alert and oriented. No focal neurological deficits. Extremities: Symmetric 5 x 5 power. Skin: No rashes, lesions or ulcers Psychiatry: Judgement and insight appear normal. Mood & affect appropriate.     Data Reviewed: I have personally reviewed following labs and imaging studies  CBC: Recent Labs  Lab 01/04/20 1258 01/05/20 0237 01/05/20 1940 01/06/20 0308  WBC 22.2* 16.4*  --  12.9*  NEUTROABS 18.7*  --   --   --   HGB 7.8* 8.3* 8.6* 7.6*  HCT 25.8* 26.7* 27.6* 25.2*  MCV 72.9* 77.8*  --  80.0  PLT 496* 349  --  341   Basic Metabolic Panel: Recent Labs  Lab 01/04/20 1258 01/05/20 0237 01/06/20 0308  NA 135 143 154*  K 2.5* 2.9* 2.8*  CL 89* 107 120*  CO2 20* 18* 19*  GLUCOSE 148* 116* 115*  BUN 87* 194* 84*  CREATININE 6.47* 3.49* 1.41*  CALCIUM 8.3* 8.4* 8.7*   GFR: CrCl cannot be calculated (Unknown ideal weight.). Liver Function Tests: Recent Labs  Lab 01/04/20 1258 01/05/20 0237 01/06/20 0308  AST 16 16 15   ALT 15 13 13   ALKPHOS 65 50 49  BILITOT 0.6 0.6 0.8  PROT 7.5 6.3* 6.5  ALBUMIN 2.7* 2.4* 2.5*   Recent Labs  Lab 01/04/20 1258  LIPASE 26   No results for input(s): AMMONIA in the last 168 hours. Coagulation Profile: Recent Labs  Lab 01/04/20 1258  INR 1.3*   Cardiac Enzymes: No results for input(s): CKTOTAL, CKMB, CKMBINDEX, TROPONINI in the last 168 hours. BNP (last 3 results) No results for input(s): PROBNP in the last 8760 hours. HbA1C: No results for input(s): HGBA1C in the last 72 hours. CBG: No results for input(s): GLUCAP in the last 168 hours. Lipid Profile: No results for input(s): CHOL, HDL, LDLCALC, TRIG, CHOLHDL, LDLDIRECT in the last 72 hours. Thyroid Function Tests: No results for input(s): TSH, T4TOTAL, FREET4, T3FREE, THYROIDAB in the last 72 hours. Anemia  Panel: Recent Labs    01/04/20 1258  VITAMINB12 332  FOLATE 12.7  FERRITIN 207  TIBC 315  IRON 68   Sepsis Labs: Recent Labs  Lab 01/04/20 1301 01/05/20 0237 01/06/20 0308  PROCALCITON  --  2.68 0.91  LATICACIDVEN 1.8  --   --     Recent Results (from the past 240 hour(s))  SARS Coronavirus 2 by RT PCR (hospital order, [facility]) Nasopharyngeal Nasopharyngeal Swab     Status: None   Collection Time: 01/04/20 12:58 PM   Specimen: Nasopharyngeal Swab  Result Value Ref Range Status   SARS Coronavirus 2 NEGATIVE NEGATIVE Final  Comment: (NOTE) SARS-CoV-2 target nucleic acids are NOT DETECTED.  The SARS-CoV-2 RNA is generally detectable in upper and lower respiratory specimens during the acute phase of infection. The lowest concentration of SARS-CoV-2 viral copies this assay can detect is 250 copies / mL. A negative result does not preclude SARS-CoV-2 infection and should not be used as the sole basis for treatment or other patient management decisions.  A negative result may occur with improper specimen collection / handling, submission of specimen other than nasopharyngeal swab, presence of viral mutation(s) within the areas targeted by this assay, and inadequate number of viral copies (<250 copies / mL). A negative result must be combined with clinical observations, patient history, and epidemiological information.  Fact Sheet for Patients:   BoilerBrush.com.cy  Fact Sheet for Healthcare Providers: https://pope.com/  This test is not yet approved or  cleared by the [place] FDA and has been authorized for detection and/or diagnosis of SARS-CoV-2 by FDA under an Emergency Use Authorization (EUA).  This EUA will remain in effect (meaning this test can be used) for the duration of the COVID-19 declaration under Section 564(b)(1) of the Act, 21 U.S.C. section 360bbb-3(b)(1), unless the  authorization is terminated or revoked sooner.  Performed at [facility], [address] [place], [place] [address]   MRSA PCR Screening     Status: Abnormal   Collection Time: 01/04/20 11:28 PM   Specimen: Nasopharyngeal  Result Value Ref Range Status   MRSA by PCR POSITIVE (A) NEGATIVE Final    Comment:        The GeneXpert MRSA Assay (FDA approved for NASAL specimens only), is one component of a comprehensive MRSA colonization surveillance program. It is not intended to diagnose MRSA infection nor to guide or monitor treatment for MRSA infections. RESULT CALLED TO, READ BACK BY AND VERIFIED WITH: A,ROTHERMEL AT 0118 ON 01/05/20 BY A,MOHAMED Performed at [facility], [address] [place], [place] [address]          Radiology Studies: DG Chest 2 View  Result Date: 01/04/2020 CLINICAL DATA:  Repeat chest x-ray with nipple markers placed. EXAM: CHEST - 2 VIEW COMPARISON:  January 04, 2020 (1:25 p.m.) FINDINGS: There is no evidence of acute infiltrate. Bilateral nipple markers are noted. There is a small right pleural effusion. No pneumothorax is seen. The heart size and mediastinal contours are within normal limits. The visualized skeletal structures are unremarkable. IMPRESSION: 1. Small right pleural effusion. 2. Bilateral nipple markers without evidence of pneumothorax. Electronically Signed   By: Aram Candela M.D.   On: 01/04/2020 18:31   US Abdomen Complete  Result Date: 01/04/2020 CLINICAL DATA:  Abdominal pain EXAM: ABDOMEN ULTRASOUND COMPLETE COMPARISON:  None. FINDINGS: Gallbladder: The gallbladder is distended layering sludge/small stones. No sonographic Murphy sign seen. There is a mildly prominent gallbladder wall measuring up to 3.5 mm Common bile duct: Diameter: 5.3 mm Liver: Increased echotexture seen throughout. No focal abnormality or biliary ductal dilatation. The liver is enlarged measuring 19 cm in craniocaudad  dimension. Portal vein is patent on color Doppler imaging with normal direction of blood flow towards the liver. IVC: No abnormality visualized. Pancreas: Visualized portion unremarkable. Spleen: Size and appearance within normal limits. Right Kidney: Length: 11.3 cm. Echogenicity within normal limits. No mass or hydronephrosis visualized. Left Kidney: Length: 12.0cm. Echogenicity within normal limits. No mass or hydronephrosis visualized. Abdominal aorta: No aneurysm visualized. Other findings: None. IMPRESSION: Layering gallbladder sludge/small stones with a mildly prominent gallbladder wall.  This is nonspecific, could be due to acute cholecystitis. Hepatic steatosis and mild hepatomegaly Electronically Signed   By: Jonna Clark M.D.   On: 01/04/2020 16:40   DG Chest Port 1 View  Result Date: 01/04/2020 CLINICAL DATA:  Vomiting for 1 week. EXAM: PORTABLE CHEST 1 VIEW COMPARISON:  11/08/2016 FINDINGS: Nodular prominence along the right diaphragmatic surface. No focal consolidation. No pleural effusion or pneumothorax. Heart and mediastinal contours are unremarkable. No acute osseous abnormality. IMPRESSION: 1. No acute cardiopulmonary disease. 2. Nodular prominence along the right diaphragmatic surface which may reflect a prominent nipple shadow or a true pulmonary nodule. Recommend further evaluation with a lateral view of the chest and a frontal view with a nipple markers. Electronically Signed   By: Elige Ko   On: 01/04/2020 14:07        Scheduled Meds: . sodium chloride   Intravenous Once  . alum & mag hydroxide-simeth  30 mL Oral Once   And  . lidocaine  15 mL Oral Once  . Buprenorphine HCl-Naloxone HCl  1 Film Sublingual Daily  . Chlorhexidine Gluconate Cloth  6 each Topical Daily  . Chlorhexidine Gluconate Cloth  6 each Topical Q0600  . dicyclomine  10 mg Oral Once  . mouth rinse  15 mL Mouth Rinse BID  . mometasone-formoterol  2 puff Inhalation BID  . mupirocin ointment  1  application Nasal BID  . pantoprazole (PROTONIX) IV  40 mg Intravenous Q12H  . sucralfate  1 g Oral QID  . tamsulosin  0.4 mg Oral Daily   Continuous Infusions: . pantoprozole (PROTONIX) infusion 8 mg/hr (01/05/20 2200)     LOS: 2 days     Alwyn Ren, MD 01/06/2020, 9:01 AM

## 2020-01-06 NOTE — Procedures (Addendum)
Patient Name: Rosser Collington  MRN: [id number]  Epilepsy Attending: Charlsie Quest  Referring Physician/Provider: Dr. Blanchard Mane Date: 01/06/2020 Duration: 23.58 minutes  Patient history: 74 year old male with possible GI bleed .  EEG 12 for seizures.  Level of alertness: Awake, asleep  AEDs during EEG study: None  Technical aspects: This EEG study was done with scalp electrodes positioned according to the 10-20 International system of electrode placement. Electrical activity was acquired at a sampling rate of 500Hz  and reviewed with a high frequency filter of 70Hz  and a low frequency filter of 1Hz . EEG data were recorded continuously and digitally stored.   Description: During awake state, no clear posterior dominant rhythm was seen.  Sleep was characterized by vertex waves, sleep spindles (12 to 14 Hz), maximal frontocentral region. EEG showed continuous generalized 3 to 6 Hz theta-delta slowing.  Hyperventilation and photic stimulation were not performed.     ABNORMALITY -Continuous slow, generalized  IMPRESSION: This study is suggestive of moderate diffuse encephalopathy, nonspecific etiology.  No seizures or epileptiform discharges were seen throughout the recording.  Prisca Gearing 

## 2020-01-06 NOTE — Progress Notes (Signed)
Initial Nutrition Assessment  DOCUMENTATION CODES:   Non-severe (moderate) malnutrition in context of chronic illness  INTERVENTION:  - will order [employer] 1.4 po BID, each supplement provides 455 kcal and 20 grams protein. - will order [employer] with lunch meals, each supplement provides 290 kcal and 9 grams of protein. - will order 1 tablet multivitamin with minerals/day.   Monitor magnesium, potassium, and phosphorus daily for at least 3 days, MD to replete as needed, as pt is at risk for refeeding syndrome given hypokalemia, hypomagnesemia, and moderate malnutrition.   NUTRITION DIAGNOSIS:   Moderate Malnutrition related to chronic illness (peptic ulcer disease) as evidenced by mild fat depletion, mild muscle depletion.  GOAL:   Patient will meet greater than or equal to 90% of their needs  MONITOR:   PO intake, Supplement acceptance, Diet advancement, Labs, Weight trends  REASON FOR ASSESSMENT:   Malnutrition Screening Tool  ASSESSMENT:   74 y.o. male with medical history of peptic ulcer disease. He presented to the ED due to 4 days of N/V/D, black stools, and inability to keep down anything he ate or drank.  Diet advanced from NPO to FLD yesterday at 1020 and he consumed 75% of lunch and 25% of dinner. Visualized lunch tray with 75% completion.  Patient confirms N/V/D x4 days PTA but states he is feeling better now. He denies any N/V after lunch. He denies any chewing or swallowing difficulties.   He reports that he is "unable to digest food" at baseline. He clarifies to state that this is indigestion and a heartburn-like feeling that is not worsened or caused by any particular food items. He takes antacids at home but reports that they are not helpful.   Patient has not been weighed within the health system since 2019. He weighs himself at home; last weighed himself a month ago at which time he weighed 174 lb which is a usual weight for him.  RN weighed patient  while RD was at bedside. Current weight is 160 lb. This indicates 14 lb weight loss (8% body weight) in the past 1 month; significant for time frame.   Per notes: - UGIB s/p EGD which showed esophagitis, hiatal hernia, gastritis, and non-bleeding duodenal ulcer - AKI   Labs reviewed; Na: 154 mmol/l, K: 2.8 mmol/l, Cl: 120 mmol/l, BUN: 84 mg/dl, creatinine: 2.69 mg/dl, Ca: 8.7 mg/dl, Mg: 1.6 mg/dl. Medications reviewed; 2 g IV Mg sulfate x1 run 8/16, 40 mg IV protonix BID, 10 mEq IV KCl x3 runs 8/15 and x5 runs 8/16, 1 g carafate QID. IVF; 1/2 NS @ 75 ml/hr.      NUTRITION - FOCUSED PHYSICAL EXAM:    Most Recent Value  Orbital Region Mild depletion  Upper Arm Region Mild depletion  Thoracic and Lumbar Region Moderate depletion  Buccal Region Moderate depletion  Temple Region No depletion  Clavicle Bone Region No depletion  Clavicle and Acromion Bone Region Mild depletion  Scapular Bone Region Unable to assess  Dorsal Hand Mild depletion  Patellar Region Mild depletion  Anterior Thigh Region Mild depletion  Posterior Calf Region Mild depletion  Edema (RD Assessment) None  Hair Reviewed  Eyes Reviewed  Mouth Reviewed  Skin Reviewed  Nails Reviewed       Diet Order:   Diet Order            Diet full liquid Room service appropriate? Yes; Fluid consistency: Thin  Diet effective now  EDUCATION NEEDS:   No education needs have been identified at this time  Skin:  Skin Assessment: Reviewed RN Assessment  Last BM:  8/16 (type 4)  Height:   Ht Readings from Last 1 Encounters:  05/02/18 6' (1.829 m)    Weight:   Wt Readings from Last 1 Encounters:  01/06/20 72.8 kg    Estimated Nutritional Needs:  Kcal:  1700-1900 kcal Protein:  80-90 grams Fluid:  >/= 2.1 L/day     Trenton Gammon, MS, RD, LDN, CNSC Inpatient Clinical Dietitian RD pager # available in [facility]  After hours/weekend pager # available in [facility]

## 2020-01-07 ENCOUNTER — Other Ambulatory Visit: Payer: Self-pay

## 2020-01-07 ENCOUNTER — Inpatient Hospital Stay (HOSPITAL_COMMUNITY): Payer: Medicare Other

## 2020-01-07 DIAGNOSIS — K2981 Duodenitis with bleeding: Secondary | ICD-10-CM

## 2020-01-07 DIAGNOSIS — K259 Gastric ulcer, unspecified as acute or chronic, without hemorrhage or perforation: Secondary | ICD-10-CM

## 2020-01-07 DIAGNOSIS — R1310 Dysphagia, unspecified: Secondary | ICD-10-CM

## 2020-01-07 LAB — BASIC METABOLIC PANEL
Anion gap: 10 (ref 5–15)
BUN: 31 mg/dL — ABNORMAL HIGH (ref 8–23)
CO2: 19 mmol/L — ABNORMAL LOW (ref 22–32)
Calcium: 8 mg/dL — ABNORMAL LOW (ref 8.9–10.3)
Chloride: 120 mmol/L — ABNORMAL HIGH (ref 98–111)
Creatinine, Ser: 0.97 mg/dL (ref 0.61–1.24)
GFR calc Af Amer: >60 mL/min (ref >60)
GFR calc non Af Amer: >60 mL/min (ref >60)
Glucose, Bld: 152 mg/dL — ABNORMAL HIGH (ref 70–99)
Potassium: 3 mmol/L — ABNORMAL LOW (ref 3.5–5.1)
Sodium: 149 mmol/L — ABNORMAL HIGH (ref 135–145)

## 2020-01-07 LAB — CBC
HCT: 22.6 % — ABNORMAL LOW (ref 39.0–52.0)
Hemoglobin: 6.8 g/dL — CL (ref 13.0–17.0)
MCH: 24.8 pg — ABNORMAL LOW (ref 26.0–34.0)
MCHC: 30.1 g/dL (ref 30.0–36.0)
MCV: 82.5 fL (ref 80.0–100.0)
Platelets: 323 10*3/uL (ref 150–400)
RBC: 2.74 MIL/uL — ABNORMAL LOW (ref 4.22–5.81)
RDW: 19.9 % — ABNORMAL HIGH (ref 11.5–15.5)
WBC: 16 10*3/uL — ABNORMAL HIGH (ref 4.0–10.5)
nRBC: 0.1 % (ref 0.0–0.2)

## 2020-01-07 LAB — PHOSPHORUS: Phosphorus: 2.1 mg/dL — ABNORMAL LOW (ref 2.5–4.6)

## 2020-01-07 LAB — HEMOGLOBIN AND HEMATOCRIT, BLOOD
HCT: 31.2 % — ABNORMAL LOW (ref 39.0–52.0)
Hemoglobin: 9.8 g/dL — ABNORMAL LOW (ref 13.0–17.0)

## 2020-01-07 LAB — MAGNESIUM: Magnesium: 1.7 mg/dL (ref 1.7–2.4)

## 2020-01-07 LAB — PREPARE RBC (CROSSMATCH)

## 2020-01-07 LAB — SURGICAL PATHOLOGY

## 2020-01-07 MED ORDER — AMLODIPINE BESYLATE 5 MG PO TABS
5.0000 mg | ORAL_TABLET | Freq: Every day | ORAL | Status: DC
  Administered 2020-01-07 – 2020-01-17 (×11): 5 mg via ORAL
  Filled 2020-01-07 (×11): qty 1

## 2020-01-07 MED ORDER — POTASSIUM CHLORIDE 10 MEQ/100ML IV SOLN
10.0000 meq | INTRAVENOUS | Status: AC
  Administered 2020-01-07 (×5): 10 meq via INTRAVENOUS
  Filled 2020-01-07 (×5): qty 100

## 2020-01-07 MED ORDER — ALUM & MAG HYDROXIDE-SIMETH 200-200-20 MG/5ML PO SUSP
30.0000 mL | ORAL | Status: DC | PRN
  Administered 2020-01-09 – 2020-01-17 (×5): 30 mL via ORAL
  Filled 2020-01-07 (×5): qty 30

## 2020-01-07 MED ORDER — PHENOL 1.4 % MT LIQD
1.0000 | OROMUCOSAL | Status: DC | PRN
  Filled 2020-01-07: qty 177

## 2020-01-07 MED ORDER — MELATONIN 3 MG PO TABS
6.0000 mg | ORAL_TABLET | Freq: Every evening | ORAL | Status: DC | PRN
  Administered 2020-01-07 – 2020-01-10 (×4): 6 mg via ORAL
  Filled 2020-01-07 (×4): qty 2

## 2020-01-07 MED ORDER — LIDOCAINE VISCOUS HCL 2 % MT SOLN
15.0000 mL | Freq: Once | OROMUCOSAL | Status: DC
  Filled 2020-01-07: qty 15

## 2020-01-07 MED ORDER — SODIUM CHLORIDE 0.9% IV SOLUTION: Freq: Once | INTRAVENOUS | Status: AC

## 2020-01-07 NOTE — Progress Notes (Signed)
Patient ID: Karim Aiello, male   DOB: 01-May-1946, 74 y.o.   MRN: [id number]    Progress Note   Subjective   Day # 3  CC; GI bleed-numerous duodenal ulcers, esophagitis  WBC 16, hemoglobin down to 6.8 Potassium 3.0  EEG yesterday-suggestive of moderate diffuse encephalopathy nonspecific  1 melenic stool earlier this a.m., no bowel movements yesterday. Nurse reports patient complaining of sore throat with swallowing.  He did not offer complaint of abdominal pain but when asked stated that he still having abdominal pain though not as bad as it had been.    Objective   Vital signs in last 24 hours: Temp:  [98.4 F (36.9 C)-99.5 F (37.5 C)] 99.5 F (37.5 C) (08/17 0834) Pulse Rate:  [86-109] 95 (08/17 0530) Resp:  [12-29] 16 (08/17 0737) BP: (101-144)/(48-80) 133/65 (08/17 0530) SpO2:  [98 %-100 %] 98 % (08/17 0530) Weight:  [72.8 kg] 72.8 kg (08/16 1310)   General:    Elderly African-American male in NAD, weak and frail appearing oropharynx with uvular erythema Heart:  Regular rate and rhythm; no murmurs Lungs: Respirations even and unlabored, lungs CTA bilaterally Abdomen:  Soft, mild tenderness across the upper abdomen and nondistended. Normal bowel sounds. Extremities:  Without edema. Neurologic:  Alert and oriented,  grossly normal neurologically. Psych:  Cooperative. Normal mood and affect.  Intake/Output from previous day: 08/16 0701 - 08/17 0700 In: 2392.8 [P.O.:360; I.V.:1485.2; IV Piggyback:547.6] Out: 2200 [Urine:2200] Intake/Output this shift: No intake/output data recorded.  Lab Results: Recent Labs    01/05/20 0237 01/05/20 0237 01/05/20 1940 01/06/20 0308 01/07/20 0728  WBC 16.4*  --   --  12.9* 16.0*  HGB 8.3*   < > 8.6* 7.6* 6.8*  HCT 26.7*   < > 27.6* 25.2* 22.6*  PLT 349  --   --  341 323   < > = values in this interval not displayed.   BMET Recent Labs    01/05/20 0237 01/06/20 0308 01/07/20 0728  NA 143 154* 149*  K 2.9* 2.8*  3.0*  CL 107 120* 120*  CO2 18* 19* 19*  GLUCOSE 116* 115* 152*  BUN 194* 84* 31*  CREATININE 3.49* 1.41* 0.97  CALCIUM 8.4* 8.7* 8.0*   LFT Recent Labs    01/06/20 0308  PROT 6.5  ALBUMIN 2.5*  AST 15  ALT 13  ALKPHOS 49  BILITOT 0.8   PT/INR Recent Labs    01/04/20 1258  LABPROT 16.0*  INR 1.3*    Studies/Results: EEG  Result Date: 01/06/2020 Charlsie Quest, MD     01/06/2020 12:02 PM Patient Name: Kamuela Magos MRN: [id number] Epilepsy Attending: Charlsie Quest Referring Physician/Provider: Dr. Blanchard Mane Date: 01/06/2020 Duration: 23.58 minutes Patient history: 74 year old male with possible GI bleed .  EEG 12 for seizures. Level of alertness: Awake, asleep AEDs during EEG study: None Technical aspects: This EEG study was done with scalp electrodes positioned according to the 10-20 International system of electrode placement. Electrical activity was acquired at a sampling rate of 500Hz  and reviewed with a high frequency filter of 70Hz  and a low frequency filter of 1Hz . EEG data were recorded continuously and digitally stored. Description: During awake state, no clear posterior dominant rhythm was seen.  Sleep was characterized by vertex waves, sleep spindles (12 to 14 Hz), maximal frontocentral region. EEG showed continuous generalized 3 to 6 Hz theta-delta slowing.  Hyperventilation and photic stimulation were not performed.   ABNORMALITY -Continuous slow, generalized IMPRESSION: This study is  suggestive of moderate diffuse encephalopathy, nonspecific etiology. No seizures or epileptiform discharges were seen throughout the recording. Priyanka Annabelle Harman       Assessment / Plan:    #70 74 year old African-American male with grade D esophagitis, patchy gastritis and numerous nonbleeding cratered duodenal ulcers largest 20 mm No active bleeding at the time of EGD. Biopsies pending  Patient completed PPI infusion, was switched to twice daily PPI IV this  a.m. Hemoglobin has drifted to 6.8.  1 tarry stool this a.m.-suspect this may be old blood as no bowel movements yesterday We will monitor carefully To be transfused 2 units packed RBCs Full liquid diet We will check KUB due to persistent complaints of abdominal pain Patient also with complaints of sore throat and some odynophagia Continue Carafate suspension 4 times daily, Maalox with lidocaine ordered every 3 hours as needed  #2 prior history of CVA #3.  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  EEG nonspecific yesterday.  Neurology following.  Hopefully he can be put on an alternative regimen for his chronic headaches. #4.  History of polysubstance abuse on Suboxone   Active Problems:   Gastrointestinal hemorrhage   Hypokalemia   Leukocytosis   Acute esophagitis   Malnutrition of moderate degree     LOS: 3 days   Anslee Micheletti EsterwoodPA-C  01/07/2020, 8:57 AM

## 2020-01-07 NOTE — Progress Notes (Addendum)
PROGRESS NOTE    [name]  QIW:[id number] DOB: 1946/01/07 DOA: 01/04/2020 PCP: [name], MD   Brief Narrative: [name] is a 74 y.o. male with medical history significant of peptic ulcer disease admitted with nausea vomiting and black stools.  Patient admitted with nausea vomiting and diarrhea for the past 4 days prior to admission to hospital. Associated with this he also complains of abdominal discomfort. He had subjective fever at home though he did not check his temperature at home. Denies any cough. He did not have Covid in the past year. He denies any urinary complaints. He does complain of shortness of breath and dyspnea on exertion he reports occasional chest pain. He reports he has not been able to eat or drink anything for the last 4 days not been able to keep it down as he was vomiting. He takes Goody powder and aspirin and multiple antihypertensives at home. He is also on Suboxone at home. ED Course: His potassium was 2.5 sodium was 135 BUN 87 creatinine 6.47 anion gap was 26, white count 22.2, hemoglobin 7.8 platelets 496 INR 1.3 Blood pressure 95/56 came up to 105/ 54 heart rate 90 respiration 19 sats 99% on 2 L temperature 98.7. He was seen by PCCM in the ER and asked hospitalist to admit the patient.  Assessment & Plan:   Active Problems:   Gastrointestinal hemorrhage   Hypokalemia   Leukocytosis   Acute esophagitis   Malnutrition of moderate degree   #1 upper GI bleed EGD shows esophagitis with bleeding which was biopsied.  Hiatal hernia and gastritis.  Nonbleeding duodenal ulcer with a clean ulcer base.  Congested duodenal mucosa. Continue PPI,carafate  Hold any blood thinners aspirin Goody powder etc. HB 6.7 8/17 had one black tarry stool today  Transfuse 2 units prbc 8/17 He has already received 2 units of blood transfusion during this hospital stay. KUB with moderate amount of stool in the colon   #2 AKI resolved with IVF  0.97  from 6.47 on admit  #3 leukocytosis  Holding  off on starting any antibiotics no localized infection noted ua negative Chest xray negative  #4 hypokalemia k 3.0 replete and recheck levels in a.m.check mag 1.7  #5 high gap acidosis due to AKI resolved   #6 urinary retention with history of BPH Foley catheter placed in the ER.DC foley  #7 history of polysubstance abuse urine drug screen positive for cocaine patient on Suboxone at home  #8 hypotension resolved  most likely secondary to GI bleed with dehydration ongoing nausea vomiting and diarrhea prior to admission to hospital, he is also on multiple antihypertensives at home which he has been taking. No evidence of infection identified so far. Blood culture ordered on admit but not done.  bp 172/64 restart home meds slowly, restarting Norvasc 5 mg daily.  #9 Hypernatremia- Na 149 down from 154 after NS stopped   Estimated body mass index is 21.77 kg/m as calculated from the following:   Height as of this encounter: 6' (1.829 m).   Weight as of this encounter: 72.8 kg.  DVT prophylaxis: SCD  code Status full code  family Communication:dw brother Disposition Plan:  Status is: Inpatient  Dispo: The patient is from: Home              Anticipated d/c is to: Home              Anticipated d/c date is more than 2 days  Patient currently is not medically stable to d/c.  Patient admitted with acute GI bleed getting blood transfusion    Consultants:   GI  Procedures: EGD 01/05/2020 Antimicrobials: None  Subjective: He had 1 tarry black stools this morning overnight no events  objective: Vitals:   01/07/20 1013 01/07/20 1100 01/07/20 1219 01/07/20 1225  BP: (!) 143/64 (!) 154/78  (!) 172/64  Pulse: 84 98    Resp: (!) 24 18    Temp: 99 F (37.2 C)  98.6 F (37 C) 98 F (36.7 C)  TempSrc: Oral  Axillary Oral  SpO2: 100% 100%    Weight:      Height:        Intake/Output Summary (Last 24 hours) at 01/07/2020  1230 Last data filed at 01/07/2020 1220 Gross per 24 hour  Intake 2310.6 ml  Output 2200 ml  Net 110.6 ml   Filed Weights   01/06/20 1310  Weight: 72.8 kg    Examination:  General exam: Appears calm and comfortable  Respiratory system: Clear to auscultation. Respiratory effort normal. Cardiovascular system: S1 & S2 heard, RRR. No JVD, murmurs, rubs, gallops or clicks. No pedal edema. Gastrointestinal system: Abdomen is nondistended, soft and nontender. No organomegaly or masses felt. Normal bowel sounds heard. Central nervous system: Alert and oriented. No focal neurological deficits. Extremities: Symmetric 5 x 5 power. Skin: No rashes, lesions or ulcers Psychiatry: Judgement and insight appear normal. Mood & affect appropriate.     Data Reviewed: I have personally reviewed following labs and imaging studies  CBC: Recent Labs  Lab 01/04/20 1258 01/05/20 0237 01/05/20 1940 01/06/20 0308 01/07/20 0728  WBC 22.2* 16.4*  --  12.9* 16.0*  NEUTROABS 18.7*  --   --   --   --   HGB 7.8* 8.3* 8.6* 7.6* 6.8*  HCT 25.8* 26.7* 27.6* 25.2* 22.6*  MCV 72.9* 77.8*  --  80.0 82.5  PLT 496* 349  --  341 323   Basic Metabolic Panel: Recent Labs  Lab 01/04/20 1258 01/05/20 0237 01/06/20 0308 01/06/20 0559 01/07/20 0241 01/07/20 0728  NA 135 143 154*  --   --  149*  K 2.5* 2.9* 2.8*  --   --  3.0*  CL 89* 107 120*  --   --  120*  CO2 20* 18* 19*  --   --  19*  GLUCOSE 148* 116* 115*  --   --  152*  BUN 87* 194* 84*  --   --  31*  CREATININE 6.47* 3.49* 1.41*  --   --  0.97  CALCIUM 8.3* 8.4* 8.7*  --   --  8.0*  MG  --   --   --  1.6* 1.7  --   PHOS  --   --   --  3.0 2.1*  --    GFR: Estimated Creatinine Clearance: 71.9 mL/min (by C-G formula based on SCr of 0.97 mg/dL). Liver Function Tests: Recent Labs  Lab 01/04/20 1258 01/05/20 0237 01/06/20 0308  AST 16 16 15   ALT 15 13 13   ALKPHOS 65 50 49  BILITOT 0.6 0.6 0.8  PROT 7.5 6.3* 6.5  ALBUMIN 2.7* 2.4* 2.5*    Recent Labs  Lab 01/04/20 1258  LIPASE 26   No results for input(s): AMMONIA in the last 168 hours. Coagulation Profile: Recent Labs  Lab 01/04/20 1258  INR 1.3*   Cardiac Enzymes: No results for input(s): CKTOTAL, CKMB, CKMBINDEX, TROPONINI in the last 168 hours. BNP (last  3 results) No results for input(s): PROBNP in the last 8760 hours. HbA1C: No results for input(s): HGBA1C in the last 72 hours. CBG: No results for input(s): GLUCAP in the last 168 hours. Lipid Profile: No results for input(s): CHOL, HDL, LDLCALC, TRIG, CHOLHDL, LDLDIRECT in the last 72 hours. Thyroid Function Tests: No results for input(s): TSH, T4TOTAL, FREET4, T3FREE, THYROIDAB in the last 72 hours. Anemia Panel: Recent Labs    01/04/20 1258  VITAMINB12 332  FOLATE 12.7  FERRITIN 207  TIBC 315  IRON 68   Sepsis Labs: Recent Labs  Lab 01/04/20 1301 01/05/20 0237 01/06/20 0308  PROCALCITON  --  2.68 0.91  LATICACIDVEN 1.8  --   --     Recent Results (from the past 240 hour(s))  SARS Coronavirus 2 by RT PCR (hospital order, performed in [facility] hospital lab) Nasopharyngeal Nasopharyngeal Swab     Status: None   Collection Time: 01/04/20 12:58 PM   Specimen: Nasopharyngeal Swab  Result Value Ref Range Status   SARS Coronavirus 2 NEGATIVE NEGATIVE Final    Comment: (NOTE) SARS-CoV-2 target nucleic acids are NOT DETECTED.  The SARS-CoV-2 RNA is generally detectable in upper and lower respiratory specimens during the acute phase of infection. The lowest concentration of SARS-CoV-2 viral copies this assay can detect is 250 copies / mL. A negative result does not preclude SARS-CoV-2 infection and should not be used as the sole basis for treatment or other patient management decisions.  A negative result may occur with improper specimen collection / handling, submission of specimen other than nasopharyngeal swab, presence of viral mutation(s) within the areas targeted by this assay,  and inadequate number of viral copies (<250 copies / mL). A negative result must be combined with clinical observations, patient history, and epidemiological information.  Fact Sheet for Patients:   BoilerBrush.com.cy  Fact Sheet for Healthcare Providers: https://pope.com/  This test is not yet approved or  cleared by the [place] FDA and has been authorized for detection and/or diagnosis of SARS-CoV-2 by FDA under an Emergency Use Authorization (EUA).  This EUA will remain in effect (meaning this test can be used) for the duration of the COVID-19 declaration under Section 564(b)(1) of the Act, 21 U.S.C. section 360bbb-3(b)(1), unless the authorization is terminated or revoked sooner.  Performed at [facility], [address], [place], [place] [address]   MRSA PCR Screening     Status: Abnormal   Collection Time: 01/04/20 11:28 PM   Specimen: Nasopharyngeal  Result Value Ref Range Status   MRSA by PCR POSITIVE (A) NEGATIVE Final    Comment:        The GeneXpert MRSA Assay (FDA approved for NASAL specimens only), is one component of a comprehensive MRSA colonization surveillance program. It is not intended to diagnose MRSA infection nor to guide or monitor treatment for MRSA infections. RESULT CALLED TO, READ BACK BY AND VERIFIED WITH: A,ROTHERMEL AT 0118 ON 01/05/20 BY A,MOHAMED Performed at [facility], [address] [place], [place] [address]          Radiology Studies: EEG  Result Date: 01/06/2020 Charlsie Quest, MD     01/06/2020 12:02 PM Patient Name: Jaylyn Booher MRN: [id number] Epilepsy Attending: Charlsie Quest Referring Physician/Provider: Dr. Blanchard Mane Date: 01/06/2020 Duration: 23.58 minutes Patient history: 74 year old male with possible GI bleed .  EEG 12 for seizures. Level of alertness: Awake, asleep AEDs during EEG study: None Technical aspects:  This EEG study  was done with scalp electrodes positioned according to the 10-20 International system of electrode placement. Electrical activity was acquired at a sampling rate of 500Hz  and reviewed with a high frequency filter of 70Hz  and a low frequency filter of 1Hz . EEG data were recorded continuously and digitally stored. Description: During awake state, no clear posterior dominant rhythm was seen.  Sleep was characterized by vertex waves, sleep spindles (12 to 14 Hz), maximal frontocentral region. EEG showed continuous generalized 3 to 6 Hz theta-delta slowing.  Hyperventilation and photic stimulation were not performed.   ABNORMALITY -Continuous slow, generalized IMPRESSION: This study is suggestive of moderate diffuse encephalopathy, nonspecific etiology. No seizures or epileptiform discharges were seen throughout the recording.   DG Abd 2 Views  Result Date: 01/07/2020 CLINICAL DATA:  Abdominal pain with gastrointestinal bleeding EXAM: ABDOMEN - 2 VIEW COMPARISON:  November 11, 2016 FINDINGS: Supine and left lateral decubitus abdomen images obtained. There is moderate stool in the colon. There is no bowel dilatation or air-fluid level to suggest bowel obstruction. No demonstrable free air. There is aortic and iliac artery atherosclerosis bilaterally. There are phleboliths in the pelvis as well. Visualized lung bases clear. IMPRESSION: Moderate stool in colon. No bowel obstruction or free air. Multiple foci of arterial vascular calcification evident. Aortic Atherosclerosis (ICD10-I70.0). Electronically Signed   By: Charlsie Quest III M.D.   On: 01/07/2020 12:17        Scheduled Meds: . sodium chloride   Intravenous Once  . sodium chloride   Intravenous Once  . alum & mag hydroxide-simeth  30 mL Oral Once  . Buprenorphine HCl-Naloxone HCl  1 Film Sublingual Daily  . Chlorhexidine Gluconate Cloth  6 each Topical Daily  . Chlorhexidine Gluconate Cloth  6 each Topical Q0600  .  dicyclomine  10 mg Oral Once  . feeding supplement ([employer] STANDARD 1.4)  325 mL Oral BID BM  . lidocaine  15 mL Oral Once  . mouth rinse  15 mL Mouth Rinse BID  . mometasone-formoterol  2 puff Inhalation BID  . multivitamin with minerals  1 tablet Oral Daily  . mupirocin ointment  1 application Nasal BID  . pantoprazole (PROTONIX) IV  40 mg Intravenous Q12H  . sucralfate  1 g Oral QID  . tamsulosin  0.4 mg Oral Daily   Continuous Infusions: . sodium chloride 75 mL/hr at 01/06/20 1700  . potassium chloride 10 mEq (01/07/20 1230)     LOS: 3 days     01/09/2020, MD 01/07/2020, 12:30 PM

## 2020-01-08 ENCOUNTER — Inpatient Hospital Stay (HOSPITAL_COMMUNITY): Payer: Medicare Other

## 2020-01-08 ENCOUNTER — Encounter (HOSPITAL_COMMUNITY): Payer: Self-pay | Admitting: Internal Medicine

## 2020-01-08 DIAGNOSIS — E876 Hypokalemia: Secondary | ICD-10-CM

## 2020-01-08 DIAGNOSIS — E440 Moderate protein-calorie malnutrition: Secondary | ICD-10-CM

## 2020-01-08 DIAGNOSIS — K920 Hematemesis: Secondary | ICD-10-CM

## 2020-01-08 LAB — TYPE AND SCREEN
ABO/RH(D): A POS
Antibody Screen: NEGATIVE
Unit division: 0
Unit division: 0
Unit division: 0

## 2020-01-08 LAB — BPAM RBC
Blood Product Expiration Date: 202109082359
Blood Product Expiration Date: 202109082359
Blood Product Expiration Date: 202109082359
ISSUE DATE / TIME: 202108141956
ISSUE DATE / TIME: 202108170954
ISSUE DATE / TIME: 202108171314
Unit Type and Rh: 6200
Unit Type and Rh: 6200
Unit Type and Rh: 6200

## 2020-01-08 LAB — BASIC METABOLIC PANEL
Anion gap: 13 (ref 5–15)
BUN: 16 mg/dL (ref 8–23)
CO2: 18 mmol/L — ABNORMAL LOW (ref 22–32)
Calcium: 7.9 mg/dL — ABNORMAL LOW (ref 8.9–10.3)
Chloride: 118 mmol/L — ABNORMAL HIGH (ref 98–111)
Creatinine, Ser: 0.82 mg/dL (ref 0.61–1.24)
GFR calc Af Amer: >60 mL/min (ref >60)
GFR calc non Af Amer: >60 mL/min (ref >60)
Glucose, Bld: 115 mg/dL — ABNORMAL HIGH (ref 70–99)
Potassium: 3.4 mmol/L — ABNORMAL LOW (ref 3.5–5.1)
Sodium: 149 mmol/L — ABNORMAL HIGH (ref 135–145)

## 2020-01-08 LAB — MAGNESIUM: Magnesium: 1.2 mg/dL — ABNORMAL LOW (ref 1.7–2.4)

## 2020-01-08 LAB — PHOSPHORUS: Phosphorus: 2.3 mg/dL — ABNORMAL LOW (ref 2.5–4.6)

## 2020-01-08 MED ORDER — IOHEXOL 9 MG/ML PO SOLN
ORAL | Status: AC
  Filled 2020-01-08: qty 1000

## 2020-01-08 MED ORDER — MAGNESIUM SULFATE 4 GM/100ML IV SOLN
4.0000 g | Freq: Once | INTRAVENOUS | Status: AC
  Administered 2020-01-08: 4 g via INTRAVENOUS
  Filled 2020-01-08: qty 100

## 2020-01-08 MED ORDER — PANTOPRAZOLE SODIUM 40 MG PO TBEC
40.0000 mg | DELAYED_RELEASE_TABLET | Freq: Two times a day (BID) | ORAL | Status: DC
  Administered 2020-01-08 – 2020-01-09 (×2): 40 mg via ORAL
  Filled 2020-01-08 (×2): qty 1

## 2020-01-08 MED ORDER — POTASSIUM CHLORIDE CRYS ER 20 MEQ PO TBCR
40.0000 meq | EXTENDED_RELEASE_TABLET | Freq: Once | ORAL | Status: AC
  Administered 2020-01-08: 40 meq via ORAL
  Filled 2020-01-08: qty 2

## 2020-01-08 MED ORDER — POTASSIUM CHLORIDE CRYS ER 20 MEQ PO TBCR: 20.0000 meq | EXTENDED_RELEASE_TABLET | Freq: Two times a day (BID) | ORAL | Status: DC

## 2020-01-08 MED ORDER — IOHEXOL 9 MG/ML PO SOLN
500.0000 mL | ORAL | Status: AC
  Administered 2020-01-08 (×2): 500 mL via ORAL

## 2020-01-08 MED ORDER — BUPRENORPHINE HCL-NALOXONE HCL 8-2 MG SL SUBL
1.0000 | SUBLINGUAL_TABLET | Freq: Every day | SUBLINGUAL | Status: DC
  Administered 2020-01-11: 1 via SUBLINGUAL
  Filled 2020-01-08 (×5): qty 1

## 2020-01-08 MED ORDER — IOHEXOL 300 MG/ML  SOLN
100.0000 mL | Freq: Once | INTRAMUSCULAR | Status: AC | PRN
  Administered 2020-01-08: 100 mL via INTRAVENOUS

## 2020-01-08 MED ORDER — MAGNESIUM OXIDE 400 (241.3 MG) MG PO TABS: 400.0000 mg | ORAL_TABLET | Freq: Two times a day (BID) | ORAL | Status: DC

## 2020-01-08 NOTE — Progress Notes (Signed)
Magnesium 1.2 and potassium 3.4 on AM labs. Orders placed for oral replacement with morning medication pass. Will continue to monitor.

## 2020-01-08 NOTE — Evaluation (Signed)
Physical Therapy Evaluation Patient Details Name: [name] MRN: [id number] DOB: 1945-10-16 Today's Date: 01/08/2020   History of Present Illness  [name] is a 74 y.o. male with medical history significant of peptic ulcer disease admitted with nausea vomiting and black stools. Upper GI bleed EGD shows esophagitis with bleeding which was biopsied.  Hiatal hernia and gastritis.  Nonbleeding duodenal ulcer  Clinical Impression  The patient is tearful at times, sort of sobbing. Could not say why. Patient did mobilize to recliner using RW and mod assistance. HR 110-119, BP 151/79, ((5 on RA. Patient reports that he lives with a brother. Need to seehow patient progresses for more confirmed Dc plan. Pt admitted with above diagnosis. \ Pt currently with functional limitations due to the deficits listed below (see PT Problem List). Pt will benefit from skilled PT to increase their independence and safety with mobility to allow discharge to the venue listed below.       Follow Up Recommendations SNF;Supervision/Assistance - 24 hour    Equipment Recommendations  None recommended by PT    Recommendations for Other Services       Precautions / Restrictions Precautions Precautions: Fall      Mobility  Bed Mobility Overal bed mobility: Needs Assistance Bed Mobility: Supine to Sit;Sit to Supine     Supine to sit: Min assist     General bed mobility comments: gentle assistance with trunk,multimodal cues  Transfers Overall transfer level: Needs assistance Equipment used: Rolling walker (2 wheeled) Transfers: Sit to/from [employer] Sit to Stand: Mod assist Stand pivot transfers: Mod assist       General transfer comment: attempted x 2 to stand up from bed. small steps to recliner, multimodal cues for activity.  Ambulation/Gait                Stairs            Wheelchair Mobility    Modified Rankin (Stroke Patients Only)        Balance Overall balance assessment: Needs assistance Sitting-balance support: Feet supported;Bilateral upper extremity supported Sitting balance-Leahy Scale: Fair     Standing balance support: During functional activity;Bilateral upper extremity supported Standing balance-Leahy Scale: Poor                               Pertinent Vitals/Pain Pain Assessment: Faces Faces Pain Scale: Hurts little more Pain Location: abd Pain Descriptors / Indicators: Grimacing;Guarding Pain Intervention(s): Monitored during session;Limited activity within patient's tolerance    Home Living Family/patient expects to be discharged to:: Private residence Living Arrangements: Other relatives Available Help at Discharge: Family Type of Home: House Home Access: Stairs to enter     Home Layout: One level Home Equipment: None      Prior Function Level of Independence: Independent         Comments: patient tearful at times, Patient not clear on home situation and brother's availabilty     Hand Dominance        Extremity/Trunk Assessment   Upper Extremity Assessment Upper Extremity Assessment: Generalized weakness    Lower Extremity Assessment Lower Extremity Assessment: Generalized weakness    Cervical / Trunk Assessment Cervical / Trunk Assessment: Normal  Communication   Communication:  (difficult to understand at times, like he was crying and sobbed)  Cognition Arousal/Alertness: Awake/alert   Overall Cognitive Status: No family/caregiver present to determine baseline cognitive functioning Area of Impairment: Orientation;Following commands  Orientation Level: Time     Following Commands: Follows one step commands consistently;Follows one step commands with increased time       General Comments: patient tearful and sobbing lightly.      General Comments      Exercises     Assessment/Plan    PT Assessment Patient needs continued  PT services  PT Problem List Decreased strength;Decreased cognition;Decreased knowledge of use of DME;Decreased activity tolerance;Decreased safety awareness;Decreased mobility       PT Treatment Interventions DME instruction;Gait training;Functional mobility training;Cognitive remediation;Therapeutic activities;Therapeutic exercise    PT Goals (Current goals can be found in the Care Plan section)  Acute Rehab PT Goals Patient Stated Goal: none stated PT Goal Formulation: With patient Time For Goal Achievement: 01/22/20 Potential to Achieve Goals: Fair    Frequency Min 2X/week   Barriers to discharge        Co-evaluation               AM-PAC PT "6 Clicks" Mobility  Outcome Measure Help needed turning from your back to your side while in a flat bed without using bedrails?: A Lot Help needed moving from lying on your back to sitting on the side of a flat bed without using bedrails?: A Lot Help needed moving to and from a bed to a chair (including a wheelchair)?: A Lot Help needed standing up from a chair using your arms (e.g., wheelchair or bedside chair)?: A Lot Help needed to walk in hospital room?: A Lot Help needed climbing 3-5 steps with a railing? : A Lot 6 Click Score: 12    End of Session Equipment Utilized During Treatment: Gait belt Activity Tolerance: Patient tolerated treatment well Patient left: in chair;with call bell/phone within reach;with chair alarm set Nurse Communication: Mobility status PT Visit Diagnosis: Unsteadiness on feet (R26.81);Difficulty in walking, not elsewhere classified (R26.2)    Time: 7408-1448 PT Time Calculation (min) (ACUTE ONLY): 32 min   Charges:   PT Evaluation $PT Eval Low Complexity: 1 Low PT Treatments $Therapeutic Activity: 8-22 mins        Blanchard Kelch PT Acute Rehabilitation Services Pager [phone] Office [phone]   Rada Hay 01/08/2020, 5:21 PM

## 2020-01-08 NOTE — Progress Notes (Addendum)
Daily Rounding Note  01/08/2020, 11:38 AM  LOS: 4 days   SUBJECTIVE:   Chief complaint:  Abdominal pain.  PUD.  Anemia     Patient says the pain in his left upper abdomen is persistent.  No nausea or vomiting.   Dark but not bloody or melenic stools overnight, less liquid than previous. FL diet, intake labile.  Appetite diminished.   Confused at times, crying, emotionally labile per RN  OBJECTIVE:         Vital signs in last 24 hours:    Temp:  [97.7 F (36.5 C)-99.5 F (37.5 C)] 99.5 F (37.5 C) (08/18 0800) Pulse Rate:  [65-114] 86 (08/18 1035) Resp:  [15-27] 21 (08/18 1035) BP: (133-172)/(47-104) 151/79 (08/18 1035) SpO2:  [94 %-100 %] 98 % (08/18 1035)   Filed Weights   01/06/20 1310  Weight: 72.8 kg   General: Looks somewhat chronically ill.  Appears to be on the verge of tears at times during my brief visit Heart: RRR. Chest: No labored breathing, no cough.  Clear bilaterally Abdomen: Soft.  Minor to moderate left mid/upper abdominal tenderness.  Active bowel sounds. Extremities: No CCE. Neuro/Psych: Emotionally labile.  Oriented to hospital and self.  Intake/Output from previous day: 08/17 0701 - 08/18 0700 In: 1945.4 [P.O.:643; I.V.:179.5; Blood:630; IV Piggyback:492.9] Out: 2500 [Urine:2500]  Intake/Output this shift: Total I/O In: 26.5 [IV Piggyback:26.5] Out: -   Lab Results: Recent Labs    01/06/20 0308 01/07/20 0728 01/07/20 2212  WBC 12.9* 16.0*  --   HGB 7.6* 6.8* 9.8*  HCT 25.2* 22.6* 31.2*  PLT 341 323  --    BMET Recent Labs    01/06/20 0308 01/07/20 0728 01/08/20 0237  NA 154* 149* 149*  K 2.8* 3.0* 3.4*  CL 120* 120* 118*  CO2 19* 19* 18*  GLUCOSE 115* 152* 115*  BUN 84* 31* 16  CREATININE 1.41* 0.97 0.82  CALCIUM 8.7* 8.0* 7.9*   LFT Recent Labs    01/06/20 0308  PROT 6.5  ALBUMIN 2.5*  AST 15  ALT 13  ALKPHOS 49  BILITOT 0.8   PT/INR No results for  input(s): LABPROT, INR in the last 72 hours. Hepatitis Panel No results for input(s): HEPBSAG, HCVAB, HEPAIGM, HEPBIGM in the last 72 hours.  Studies/Results: EEG  Result Date: 01/06/2020 Charlsie Quest, MD     01/06/2020 12:02 PM Patient Name: Aiman Noe MRN: [id number] Epilepsy Attending: Charlsie Quest Referring Physician/Provider: Dr. Blanchard Mane Date: 01/06/2020 Duration: 23.58 minutes Patient history: 74 year old male with possible GI bleed .  EEG 12 for seizures. Level of alertness: Awake, asleep AEDs during EEG study: None Technical aspects: This EEG study was done with scalp electrodes positioned according to the 10-20 International system of electrode placement. Electrical activity was acquired at a sampling rate of 500Hz  and reviewed with a high frequency filter of 70Hz  and a low frequency filter of 1Hz . EEG data were recorded continuously and digitally stored. Description: During awake state, no clear posterior dominant rhythm was seen.  Sleep was characterized by vertex waves, sleep spindles (12 to 14 Hz), maximal frontocentral region. EEG showed continuous generalized 3 to 6 Hz theta-delta slowing.  Hyperventilation and photic stimulation were not performed.   ABNORMALITY -Continuous slow, generalized IMPRESSION: This study is suggestive of moderate diffuse encephalopathy, nonspecific etiology. No seizures or epileptiform discharges were seen throughout the recording. Priyanka   DG Abd 2 Views  Result Date:  01/07/2020 CLINICAL DATA:  Abdominal pain with gastrointestinal bleeding EXAM: ABDOMEN - 2 VIEW COMPARISON:  November 11, 2016 FINDINGS: Supine and left lateral decubitus abdomen images obtained. There is moderate stool in the colon. There is no bowel dilatation or air-fluid level to suggest bowel obstruction. No demonstrable free air. There is aortic and iliac artery atherosclerosis bilaterally. There are phleboliths in the pelvis as well. Visualized lung bases clear.  IMPRESSION: Moderate stool in colon. No bowel obstruction or free air. Multiple foci of arterial vascular calcification evident. Aortic Atherosclerosis (ICD10-I70.0). Electronically Signed   By: Bretta Bang III M.D.   On: 01/07/2020 12:17    ASSESMENT:   *   Abd pain and wt loss.  Normal LFTs and Lipase.   EGD: severe esophagitis, HH, gastritis, clean-based/non-bleeding DUs, congested duodenal mucosa.   Path: reactive gastropathy, no HP.  Peptic duodenitis.  Inflammation, granulation in esophagus.   Remote colonoscopy in [facility].    *   Anemia.  Hgb 6.8 >> 2 PRBCs >> 9.8.    *   Hypernatremia, hypokalemia, hypomagnesemia.  AKI resolved.     PLAN   *   Switch to po bid protonix.  Ordering CTAP w contrast to assess for source of pain, this may be due to ulcers but CT can r/o other issues.   Advance to [facility] diet after CT.      Jennye Moccasin  01/08/2020, 11:38 AM Phone [phone]

## 2020-01-08 NOTE — Progress Notes (Signed)
PROGRESS NOTE    Alan Lewandowskyathaniel Hurtubise  ZOX:[id number] DOB: 01-Oct-1945 DOA: 01/04/2020 PCP: Gilda CreasePavelock, Richard M, MD   Brief Narrative:  [name] is a 74 y.o. male with medical history significant of peptic ulcer disease admitted with abdominal pain, nausea, vomiting and black stools. Pt reports he has not been able to eat or drink anything for the last 4 days not been able to keep it down as he was vomiting.  Significant weight loss.  Pt takes Goody powder and aspirin and multiple antihypertensives at home. He is also on Suboxone at home. ED Course: His potassium was 2.5 sodium was 135 BUN 87 creatinine 6.47 anion gap was 26, white count 22.2, hemoglobin 7.8 platelets 496 INR 1.3 Blood pressure 95/56 came up to 105/ 54 heart rate 90 respiration 19 sats 99% on 2 L temperature 98.7. He was seen by PCCM in the ER and asked hospitalist to admit the patient.  Assessment & Plan:   Active Problems:   Gastrointestinal hemorrhage   Hypokalemia   Leukocytosis   Acute esophagitis   Malnutrition of moderate degree   Upper GI bleed  GI on board, s/p EGD showed esophagitis with bleeding which was biopsied.  Hiatal hernia and gastritis.  Nonbleeding duodenal ulcer with a clean ulcer base CT abdomen/pelvis pending for further evaluation for pain and weight loss Continue PPI, carafate   Acute blood loss anemia Likely 2/2 above Hemoglobin dropped to 6.7 on 8/17, transfused 2 units of PRBC Daily CBC  Hypokalemia/hypomagnesemia Replace as needed Daily BMP, magnesium  Leukocytosis Afebrile, unknown etiology, continues to complain of abdominal pain Chest x-ray unremarkable UA unremarkable for infection CT abdomen/pelvis pending Daily CBC  Resolved AKI with persistent acidosis/hypernatremia Likely 2/2 poor oral intake Daily BMP  Hypertension Continue Norvasc  Urinary retention with history of BPH  Foley removed, monitor closely  History of polysubstance abuse Urine drug screen  positive for cocaine Continue Suboxone      Estimated body mass index is 21.77 kg/m as calculated from the following:   Height as of this encounter: 6' (1.829 m).   Weight as of this encounter: 72.8 kg.  DVT prophylaxis: SCD  code Status full code  family Communication: None at bedside Disposition Plan:  Status is: Inpatient  Dispo: The patient is from: Home              Anticipated d/c is to: Home              Anticipated d/c date is more than 2 days              Patient currently is not medically stable to d/c.     Consultants:   GI  Procedures: EGD 01/05/2020  Antimicrobials: None  Subjective: Continues to complain of abdominal pain, denies any further nausea/vomiting, fever/chills.  Denies any chest pain, worsening shortness of breath.   objective: Vitals:   01/08/20 1035 01/08/20 1200 01/08/20 1700 01/08/20 1736  BP: (!) 151/79 128/67  (!) 153/91  Pulse: 86 (!) 101  81  Resp: (!) 21 (!) 23  20  Temp:  98.5 F (36.9 C) 98.7 F (37.1 C)   TempSrc:  Oral Oral   SpO2: 98% 98%  100%  Weight:      Height:        Intake/Output Summary (Last 24 hours) at 01/08/2020 1853 Last data filed at 01/08/2020 1846 Gross per 24 hour  Intake 26.53 ml  Output 3300 ml  Net -3273.47 ml   [employer] Power[employer]iled Weights  01/06/20 1310  Weight: 72.8 kg    Examination:  General:  Chronically ill-appearing, stutters  Cardiovascular: S1, S2 present  Respiratory: CTAB  Abdomen: Soft, generalized tenderness, nondistended, bowel sounds present  Musculoskeletal: No bilateral pedal edema noted  Skin: Normal  Psychiatry:  Fair mood     Data Reviewed: I have personally reviewed following labs and imaging studies  CBC: Recent Labs  Lab 01/04/20 1258 01/04/20 1258 01/05/20 0237 01/05/20 1940 01/06/20 0308 01/07/20 0728 01/07/20 2212  WBC 22.2*  --  16.4*  --  12.9* 16.0*  --   NEUTROABS 18.7*  --   --   --   --   --   --   HGB 7.8*   < > 8.3* 8.6* 7.6* 6.8* 9.8*  HCT 25.8*    < > 26.7* 27.6* 25.2* 22.6* 31.2*  MCV 72.9*  --  77.8*  --  80.0 82.5  --   PLT 496*  --  349  --  341 323  --    < > = values in this interval not displayed.   Basic Metabolic Panel: Recent Labs  Lab 01/04/20 1258 01/05/20 0237 01/06/20 0308 01/06/20 0559 01/07/20 0241 01/07/20 0728 01/08/20 0237  NA 135 143 154*  --   --  149* 149*  K 2.5* 2.9* 2.8*  --   --  3.0* 3.4*  CL 89* 107 120*  --   --  120* 118*  CO2 20* 18* 19*  --   --  19* 18*  GLUCOSE 148* 116* 115*  --   --  152* 115*  BUN 87* 194* 84*  --   --  31* 16  CREATININE 6.47* 3.49* 1.41*  --   --  0.97 0.82  CALCIUM 8.3* 8.4* 8.7*  --   --  8.0* 7.9*  MG  --   --   --  1.6* 1.7  --  1.2*  PHOS  --   --   --  3.0 2.1*  --  2.3*   GFR: Estimated Creatinine Clearance: 85.1 mL/min (by C-G formula based on SCr of 0.82 mg/dL). Liver Function Tests: Recent Labs  Lab 01/04/20 1258 01/05/20 0237 01/06/20 0308  AST 16 16 15   ALT 15 13 13   ALKPHOS 65 50 49  BILITOT 0.6 0.6 0.8  PROT 7.5 6.3* 6.5  ALBUMIN 2.7* 2.4* 2.5*   Recent Labs  Lab 01/04/20 1258  LIPASE 26   No results for input(s): AMMONIA in the last 168 hours. Coagulation Profile: Recent Labs  Lab 01/04/20 1258  INR 1.3*   Cardiac Enzymes: No results for input(s): CKTOTAL, CKMB, CKMBINDEX, TROPONINI in the last 168 hours. BNP (last 3 results) No results for input(s): PROBNP in the last 8760 hours. HbA1C: No results for input(s): HGBA1C in the last 72 hours. CBG: No results for input(s): GLUCAP in the last 168 hours. Lipid Profile: No results for input(s): CHOL, HDL, LDLCALC, TRIG, CHOLHDL, LDLDIRECT in the last 72 hours. Thyroid Function Tests: No results for input(s): TSH, T4TOTAL, FREET4, T3FREE, THYROIDAB in the last 72 hours. Anemia Panel: No results for input(s): VITAMINB12, FOLATE, FERRITIN, TIBC, IRON, RETICCTPCT in the last 72 hours. Sepsis Labs: Recent Labs  Lab 01/04/20 1301 01/05/20 0237 01/06/20 0308  PROCALCITON  --   2.68 0.91  LATICACIDVEN 1.8  --   --     Recent Results (from the past 240 hour(s))  SARS Coronavirus 2 by RT PCR (hospital order, performed in [facility] hospital lab) Nasopharyngeal Nasopharyngeal  Swab     Status: None   Collection Time: 01/04/20 12:58 PM   Specimen: Nasopharyngeal Swab  Result Value Ref Range Status   SARS Coronavirus 2 NEGATIVE NEGATIVE Final    Comment: (NOTE) SARS-CoV-2 target nucleic acids are NOT DETECTED.  The SARS-CoV-2 RNA is generally detectable in upper and lower respiratory specimens during the acute phase of infection. The lowest concentration of SARS-CoV-2 viral copies this assay can detect is 250 copies / mL. A negative result does not preclude SARS-CoV-2 infection and should not be used as the sole basis for treatment or other patient management decisions.  A negative result may occur with improper specimen collection / handling, submission of specimen other than nasopharyngeal swab, presence of viral mutation(s) within the areas targeted by this assay, and inadequate number of viral copies (<250 copies / mL). A negative result must be combined with clinical observations, patient history, and epidemiological information.  Fact Sheet for Patients:   BoilerBrush.com.cy  Fact Sheet for Healthcare Providers: https://pope.com/  This test is not yet approved or  cleared by the [place] FDA and has been authorized for detection and/or diagnosis of SARS-CoV-2 by FDA under an Emergency Use Authorization (EUA).  This EUA will remain in effect (meaning this test can be used) for the duration of the COVID-19 declaration under Section 564(b)(1) of the Act, 21 U.S.C. section 360bbb-3(b)(1), unless the authorization is terminated or revoked sooner.  Performed at [facility], [address] [place], [place] [address]   MRSA PCR Screening     Status: Abnormal   Collection Time:  01/04/20 11:28 PM   Specimen: Nasopharyngeal  Result Value Ref Range Status   MRSA by PCR POSITIVE (A) NEGATIVE Final    Comment:        The GeneXpert MRSA Assay (FDA approved for NASAL specimens only), is one component of a comprehensive MRSA colonization surveillance program. It is not intended to diagnose MRSA infection nor to guide or monitor treatment for MRSA infections. RESULT CALLED TO, READ BACK BY AND VERIFIED WITH: A,ROTHERMEL AT 0118 ON 01/05/20 BY A,MOHAMED Performed at [facility], [address], [place], [place] [address]          Radiology Studies: DG Abd 2 Views  Result Date: 01/07/2020 CLINICAL DATA:  Abdominal pain with gastrointestinal bleeding EXAM: ABDOMEN - 2 VIEW COMPARISON:  November 11, 2016 FINDINGS: Supine and left lateral decubitus abdomen images obtained. There is moderate stool in the colon. There is no bowel dilatation or air-fluid level to suggest bowel obstruction. No demonstrable free air. There is aortic and iliac artery atherosclerosis bilaterally. There are phleboliths in the pelvis as well. Visualized lung bases clear. IMPRESSION: Moderate stool in colon. No bowel obstruction or free air. Multiple foci of arterial vascular calcification evident. Aortic Atherosclerosis (ICD10-I70.0). Electronically Signed   By: Bretta Bang III M.D.   On: 01/07/2020 12:17        Scheduled Meds: . amLODipine  5 mg Oral Daily  . buprenorphine-naloxone  1 tablet Sublingual Daily  . Chlorhexidine Gluconate Cloth  6 each Topical Daily  . Chlorhexidine Gluconate Cloth  6 each Topical Q0600  . feeding supplement ([employer] STANDARD 1.4)  325 mL Oral BID BM  . iohexol      . lidocaine  15 mL Oral Once  . mouth rinse  15 mL Mouth Rinse BID  . mometasone-formoterol  2 puff Inhalation BID  . multivitamin with minerals  1 tablet Oral Daily  .  mupirocin ointment  1 application Nasal BID  . pantoprazole  40 mg Oral BID  . sucralfate  1 g  Oral QID  . tamsulosin  0.4 mg Oral Daily   Continuous Infusions:    LOS: 4 days     Briant Cedar, MD 01/08/2020, 6:53 PM

## 2020-01-09 ENCOUNTER — Inpatient Hospital Stay (HOSPITAL_COMMUNITY): Payer: Medicare Other

## 2020-01-09 DIAGNOSIS — R1084 Generalized abdominal pain: Secondary | ICD-10-CM

## 2020-01-09 DIAGNOSIS — K264 Chronic or unspecified duodenal ulcer with hemorrhage: Secondary | ICD-10-CM

## 2020-01-09 LAB — PHOSPHORUS: Phosphorus: 2.6 mg/dL (ref 2.5–4.6)

## 2020-01-09 LAB — CBC WITH DIFFERENTIAL/PLATELET
Abs Immature Granulocytes: 0.17 10*3/uL — ABNORMAL HIGH (ref 0.00–0.07)
Basophils Absolute: 0 10*3/uL (ref 0.0–0.1)
Basophils Relative: 0 %
Eosinophils Absolute: 0.2 10*3/uL (ref 0.0–0.5)
Eosinophils Relative: 1 %
HCT: 31 % — ABNORMAL LOW (ref 39.0–52.0)
Hemoglobin: 9.4 g/dL — ABNORMAL LOW (ref 13.0–17.0)
Immature Granulocytes: 1 %
Lymphocytes Relative: 9 %
Lymphs Abs: 1.6 10*3/uL (ref 0.7–4.0)
MCH: 25.1 pg — ABNORMAL LOW (ref 26.0–34.0)
MCHC: 30.3 g/dL (ref 30.0–36.0)
MCV: 82.9 fL (ref 80.0–100.0)
Monocytes Absolute: 0.9 10*3/uL (ref 0.1–1.0)
Monocytes Relative: 5 %
Neutro Abs: 14.6 10*3/uL — ABNORMAL HIGH (ref 1.7–7.7)
Neutrophils Relative %: 84 %
Platelets: 325 10*3/uL (ref 150–400)
RBC: 3.74 MIL/uL — ABNORMAL LOW (ref 4.22–5.81)
RDW: 20 % — ABNORMAL HIGH (ref 11.5–15.5)
WBC: 17.5 10*3/uL — ABNORMAL HIGH (ref 4.0–10.5)
nRBC: 0 % (ref 0.0–0.2)

## 2020-01-09 LAB — URINALYSIS, ROUTINE W REFLEX MICROSCOPIC
Bilirubin Urine: NEGATIVE
Glucose, UA: NEGATIVE mg/dL
Ketones, ur: NEGATIVE mg/dL
Nitrite: NEGATIVE
Protein, ur: 30 mg/dL — AB
Specific Gravity, Urine: 1.021 (ref 1.005–1.030)
WBC, UA: >50 WBC/hpf — ABNORMAL HIGH (ref 0–5)
pH: 6 (ref 5.0–8.0)

## 2020-01-09 LAB — BASIC METABOLIC PANEL
Anion gap: 10 (ref 5–15)
BUN: 15 mg/dL (ref 8–23)
CO2: 21 mmol/L — ABNORMAL LOW (ref 22–32)
Calcium: 7.7 mg/dL — ABNORMAL LOW (ref 8.9–10.3)
Chloride: 110 mmol/L (ref 98–111)
Creatinine, Ser: 0.74 mg/dL (ref 0.61–1.24)
GFR calc Af Amer: >60 mL/min (ref >60)
GFR calc non Af Amer: >60 mL/min (ref >60)
Glucose, Bld: 116 mg/dL — ABNORMAL HIGH (ref 70–99)
Potassium: 3.2 mmol/L — ABNORMAL LOW (ref 3.5–5.1)
Sodium: 141 mmol/L (ref 135–145)

## 2020-01-09 LAB — HEMOGLOBIN AND HEMATOCRIT, BLOOD
HCT: 28.4 % — ABNORMAL LOW (ref 39.0–52.0)
Hemoglobin: 8.9 g/dL — ABNORMAL LOW (ref 13.0–17.0)

## 2020-01-09 LAB — MAGNESIUM: Magnesium: 1.5 mg/dL — ABNORMAL LOW (ref 1.7–2.4)

## 2020-01-09 LAB — PROCALCITONIN: Procalcitonin: <0.1 ng/mL

## 2020-01-09 MED ORDER — MAGNESIUM SULFATE 2 GM/50ML IV SOLN
2.0000 g | Freq: Once | INTRAVENOUS | Status: AC
  Administered 2020-01-09: 2 g via INTRAVENOUS
  Filled 2020-01-09: qty 50

## 2020-01-09 MED ORDER — POTASSIUM CHLORIDE CRYS ER 20 MEQ PO TBCR
40.0000 meq | EXTENDED_RELEASE_TABLET | Freq: Every day | ORAL | Status: DC
  Administered 2020-01-09 – 2020-01-17 (×9): 40 meq via ORAL
  Filled 2020-01-09 (×9): qty 2

## 2020-01-09 MED ORDER — TRAMADOL HCL 50 MG PO TABS
50.0000 mg | ORAL_TABLET | Freq: Two times a day (BID) | ORAL | Status: AC | PRN
  Administered 2020-01-09: 50 mg via ORAL
  Filled 2020-01-09: qty 1

## 2020-01-09 MED ORDER — ONDANSETRON HCL 4 MG/2ML IJ SOLN: 4.0000 mg | Freq: Four times a day (QID) | INTRAMUSCULAR | Status: DC | PRN

## 2020-01-09 MED ORDER — ONDANSETRON HCL 4 MG/2ML IJ SOLN
4.0000 mg | Freq: Four times a day (QID) | INTRAMUSCULAR | Status: DC | PRN
  Administered 2020-01-10: 4 mg via INTRAVENOUS
  Filled 2020-01-09: qty 2

## 2020-01-09 MED ORDER — PANTOPRAZOLE SODIUM 40 MG IV SOLR
40.0000 mg | Freq: Two times a day (BID) | INTRAVENOUS | Status: DC
  Administered 2020-01-09 (×2): 40 mg via INTRAVENOUS
  Filled 2020-01-09 (×2): qty 40

## 2020-01-09 MED ORDER — SENNOSIDES-DOCUSATE SODIUM 8.6-50 MG PO TABS
1.0000 | ORAL_TABLET | Freq: Two times a day (BID) | ORAL | Status: DC
  Administered 2020-01-11 – 2020-01-17 (×9): 1 via ORAL
  Filled 2020-01-09 (×11): qty 1

## 2020-01-09 MED ORDER — POLYETHYLENE GLYCOL 3350 17 G PO PACK
17.0000 g | PACK | Freq: Two times a day (BID) | ORAL | Status: DC
  Administered 2020-01-12 – 2020-01-17 (×8): 17 g via ORAL
  Filled 2020-01-09 (×10): qty 1

## 2020-01-09 NOTE — H&P (View-Only) (Signed)
[facility] Progress Note  CC:  Abdominal pain. PUD. Anemia.   Subjective: He vomited up his breakfast and coffee this morning. No hematemesis. No abdominal pain.  RN reported he had a darker brown BM yesterday. On exam today, he had a formed darker brown stool with brick red blood throughout the stool. No family history of gastric or colon cancer. He reported having a colonoscopy more than 20 years ago.  No family at the bedside.  Addendum: I/O entry 10pm on 01/08/2020 documented red blood emesis.    Objective:   S/P EGD  8/15/2021with Dr. Meridee Score: - LA Grade D esophagitis with bleeding. Biopsied. - 3 cm hiatal hernia. - Gastritis. Biopsied. - Non-bleeding duodenal ulcers with a clean ulcer base (Forrest Class III). - Congested duodenal mucosa. Biopsied. - IV PPI bid x 72 hrs then po bid thereafter - Repeat EGD in 6 weeks   Vital signs in last 24 hours: Temp:  [98.1 F (36.7 C)-100.2 F (37.9 C)] 98.8 F (37.1 C) (08/19 1200) Pulse Rate:  [73-101] 96 (08/19 1000) Resp:  [16-32] 22 (08/19 1000) BP: (116-156)/(72-91) 153/81 (08/19 1000) SpO2:  [93 %-100 %] 99 % (08/19 1000) Last BM Date: 01/08/20   General:  74 year old male alert, fatigued appearing in NAD.  Heart: tachycardic, no murmur.  Pulm:  Breath sounds clear throughout.  Abdomen: Mildly protuberant, soft, nontender. + BS x 4 quadrants. No HSM.  Rectal: a moderate amount of dark brown stool with brick red blood throughout the stool, bed linen lightly saturated with red blood.  Extremities:  Without edema. Neurologic:  Alert and  oriented x4;  grossly normal neurologically. Psych:  Alert and cooperative. Normal mood and affect.  Intake/Output from previous day: 08/18 0701 - 08/19 0700 In: 26.5 [IV Piggyback:26.5] Out: 2000 [Urine:2000] Intake/Output this shift: No intake/output data recorded.  Lab Results: Recent Labs    01/07/20 0728 01/07/20 2212 01/09/20 0613  WBC 16.0*  --  17.5*    HGB 6.8* 9.8* 9.4*  HCT 22.6* 31.2* 31.0*  PLT 323  --  325   BMET Recent Labs    01/07/20 0728 01/08/20 0237 01/09/20 0613  NA 149* 149* 141  K 3.0* 3.4* 3.2*  CL 120* 118* 110  CO2 19* 18* 21*  GLUCOSE 152* 115* 116*  BUN 31* 16 15  CREATININE 0.97 0.82 0.74  CALCIUM 8.0* 7.9* 7.7*   LFT No results for input(s): PROT, ALBUMIN, AST, ALT, ALKPHOS, BILITOT, BILIDIR, IBILI in the last 72 hours. PT/INR No results for input(s): LABPROT, INR in the last 72 hours. Hepatitis Panel No results for input(s): HEPBSAG, HCVAB, HEPAIGM, HEPBIGM in the last 72 hours.  CT ABDOMEN PELVIS W CONTRAST  Result Date: 01/08/2020 CLINICAL DATA:  Acute abdominal pain, unintended weight loss EXAM: CT ABDOMEN AND PELVIS WITH CONTRAST TECHNIQUE: Multidetector CT imaging of the abdomen and pelvis was performed using the standard protocol following bolus administration of intravenous contrast. CONTRAST:  OMNIPAQUE IOHEXOL 300 MG/ML  SOLN COMPARISON:  01/07/2020, 09/07/2014 FINDINGS: Lower chest: No acute pleural or parenchymal lung disease. Hepatobiliary: No focal liver abnormality is seen. No gallstones, gallbladder wall thickening, or biliary dilatation. Pancreas: Unremarkable. No pancreatic ductal dilatation or surrounding inflammatory changes. Spleen: Normal in size without focal abnormality. Adrenals/Urinary Tract: Stable nodularity of the left adrenal gland is nonspecific but likely reflects adenoma given stability. Right adrenal is grossly normal. Stable subcentimeter left renal hypodensity likely a cyst. Otherwise the kidneys enhance normally and symmetrically. No  urinary tract calculi or obstructive uropathy. Foley catheter is seen within the bladder, with no filling defects. Small bladder diverticula are identified consistent with chronic bladder outlet obstruction. Stomach/Bowel: No bowel obstruction or ileus. No bowel wall thickening or inflammatory change. Large diverticulum off the third portion  duodenum is unchanged. No evidence of inflammation. Small hiatal hernia is noted. Vascular/Lymphatic: There is extensive atherosclerosis of the aorta and its branches. Retroaortic left renal vein is noted, a frequent anatomic variant. No pathologic adenopathy. Reproductive: Prostate is not enlarged. Other: No free fluid or free gas. No abdominal wall hernia. Musculoskeletal: No acute or destructive bony lesions. Reconstructed images demonstrate no additional findings. IMPRESSION: 1. No acute intra-abdominal or intrapelvic process. 2. Small hiatal hernia. 3. Aortic Atherosclerosis (ICD10-I70.0). Electronically Signed   By: Sharlet Salina M.D.   On: 01/08/2020 20:55   DG Chest Port 1 View  Result Date: 01/09/2020 CLINICAL DATA:  Leukocytosis EXAM: PORTABLE CHEST 1 VIEW COMPARISON:  01/04/2020 FINDINGS: Normal heart size and mild aortic tortuosity. Widening of the upper right mediastinum is from vessels based on stability and prior imaging. Hyperinflation with diaphragm flattening. There is no edema, consolidation, effusion, or pneumothorax. IMPRESSION: COPD without acute superimposed finding. Electronically Signed   By: Marnee Spring M.D.   On: 01/09/2020 07:31    Assessment / Plan: 10. 74 year old male admitted to the hospital 8/14 with abdominal pain, hematemesis/melena and 20- 30lb weight loss. S/P ED  severe esophagitis, HH, gastritis, clean-based/non-bleeding DUs, congested duodenal mucosa. Pathology report showed reactive gastropathy, no HP.  Peptic duodenitis.  Inflammation, granulation in esophagus. CTAP 8/18 due to complaints of abdominal pain was negative, no evidence no any acute intra-abdominal or intrapelvic process. On exam today, he passed a moderate amount of dark brown stool with brick red blood throughout the stool, bed linen saturated with red blood. He is tachycardic. BP stable. I & O record 10pm documented red blood emesis. Patient vomited this am after eating breakfast, no hematemesis as  confirmed by day shift RN.  -Stat H/H -Change PPI to IV bid -Clear liquid diet for now -Continue Zofran 4mg  IV Q 6 hrs PRN -Consider repeat EGD +/- colonoscopy, await further recommendations per Dr. -Recommend keeping patient in stepdown for now  ADDENDUM: Repeat Hg down to  8.9 at 12:49pm. Plan for repeat EGD with Dr. Rhea Belton tomorrow. Patient consents to repeat EGD.   2. Anemia. Hg dropped to 6.8 on 8/17. He received 2 units of PRBCs (total of 3 units of PRBCs since admission) Post transfusion Hg 9.8. Today Hg 9.4. MCV 82.9.  -See plan in # 1  3. Leukocytosis. Temp 100.2 at 4am. Tachycardic. WBC 16 -> 17.5 Blood cultures drawn. Chest xray to day showed COPD without acute superimposed finding.  4. COPD  5. History of a CVA   Active Problems:   Gastrointestinal hemorrhage   Hypokalemia   Leukocytosis   Acute esophagitis   Malnutrition of moderate degree     LOS: 5 days   9/17  01/09/2020, 12:03 PM

## 2020-01-09 NOTE — Progress Notes (Addendum)
   [facility] Progress Note  CC:  Abdominal pain. PUD. Anemia.   Subjective: He vomited up his breakfast and coffee this morning. No hematemesis. No abdominal pain.  RN reported he had a darker brown BM yesterday. On exam today, he had a formed darker brown stool with brick red blood throughout the stool. No family history of gastric or colon cancer. He reported having a colonoscopy more than 20 years ago.  No family at the bedside.  Addendum: I/O entry 10pm on 01/08/2020 documented red blood emesis.    Objective:   S/P EGD  8/15/2021with Dr. Mansouraty: - LA Grade D esophagitis with bleeding. Biopsied. - 3 cm hiatal hernia. - Gastritis. Biopsied. - Non-bleeding duodenal ulcers with a clean ulcer base (Forrest Class III). - Congested duodenal mucosa. Biopsied. - IV PPI bid x 72 hrs then po bid thereafter - Repeat EGD in 6 weeks   Vital signs in last 24 hours: Temp:  [98.1 F (36.7 C)-100.2 F (37.9 C)] 98.8 F (37.1 C) (08/19 1200) Pulse Rate:  [73-101] 96 (08/19 1000) Resp:  [16-32] 22 (08/19 1000) BP: (116-156)/(72-91) 153/81 (08/19 1000) SpO2:  [93 %-100 %] 99 % (08/19 1000) Last BM Date: 01/08/20   General:  74 year old male alert, fatigued appearing in NAD.  Heart: tachycardic, no murmur.  Pulm:  Breath sounds clear throughout.  Abdomen: Mildly protuberant, soft, nontender. + BS x 4 quadrants. No HSM.  Rectal: a moderate amount of dark brown stool with brick red blood throughout the stool, bed linen lightly saturated with red blood.  Extremities:  Without edema. Neurologic:  Alert and  oriented x4;  grossly normal neurologically. Psych:  Alert and cooperative. Normal mood and affect.  Intake/Output from previous day: 08/18 0701 - 08/19 0700 In: 26.5 [IV Piggyback:26.5] Out: 2000 [Urine:2000] Intake/Output this shift: No intake/output data recorded.  Lab Results: Recent Labs    01/07/20 0728 01/07/20 2212 01/09/20 0613  WBC 16.0*  --  17.5*    HGB 6.8* 9.8* 9.4*  HCT 22.6* 31.2* 31.0*  PLT 323  --  325   BMET Recent Labs    01/07/20 0728 01/08/20 0237 01/09/20 0613  NA 149* 149* 141  K 3.0* 3.4* 3.2*  CL 120* 118* 110  CO2 19* 18* 21*  GLUCOSE 152* 115* 116*  BUN 31* 16 15  CREATININE 0.97 0.82 0.74  CALCIUM 8.0* 7.9* 7.7*   LFT No results for input(s): PROT, ALBUMIN, AST, ALT, ALKPHOS, BILITOT, BILIDIR, IBILI in the last 72 hours. PT/INR No results for input(s): LABPROT, INR in the last 72 hours. Hepatitis Panel No results for input(s): HEPBSAG, HCVAB, HEPAIGM, HEPBIGM in the last 72 hours.  CT ABDOMEN PELVIS W CONTRAST  Result Date: 01/08/2020 CLINICAL DATA:  Acute abdominal pain, unintended weight loss EXAM: CT ABDOMEN AND PELVIS WITH CONTRAST TECHNIQUE: Multidetector CT imaging of the abdomen and pelvis was performed using the standard protocol following bolus administration of intravenous contrast. CONTRAST:  100mL OMNIPAQUE IOHEXOL 300 MG/ML  SOLN COMPARISON:  01/07/2020, 09/07/2014 FINDINGS: Lower chest: No acute pleural or parenchymal lung disease. Hepatobiliary: No focal liver abnormality is seen. No gallstones, gallbladder wall thickening, or biliary dilatation. Pancreas: Unremarkable. No pancreatic ductal dilatation or surrounding inflammatory changes. Spleen: Normal in size without focal abnormality. Adrenals/Urinary Tract: Stable nodularity of the left adrenal gland is nonspecific but likely reflects adenoma given stability. Right adrenal is grossly normal. Stable subcentimeter left renal hypodensity likely a cyst. Otherwise the kidneys enhance normally and symmetrically. No   urinary tract calculi or obstructive uropathy. Foley catheter is seen within the bladder, with no filling defects. Small bladder diverticula are identified consistent with chronic bladder outlet obstruction. Stomach/Bowel: No bowel obstruction or ileus. No bowel wall thickening or inflammatory change. Large diverticulum off the third portion  duodenum is unchanged. No evidence of inflammation. Small hiatal hernia is noted. Vascular/Lymphatic: There is extensive atherosclerosis of the aorta and its branches. Retroaortic left renal vein is noted, a frequent anatomic variant. No pathologic adenopathy. Reproductive: Prostate is not enlarged. Other: No free fluid or free gas. No abdominal wall hernia. Musculoskeletal: No acute or destructive bony lesions. Reconstructed images demonstrate no additional findings. IMPRESSION: 1. No acute intra-abdominal or intrapelvic process. 2. Small hiatal hernia. 3. Aortic Atherosclerosis (ICD10-I70.0). Electronically Signed   By: Sharlet Salina M.D.   On: 01/08/2020 20:55   DG Chest Port 1 View  Result Date: 01/09/2020 CLINICAL DATA:  Leukocytosis EXAM: PORTABLE CHEST 1 VIEW COMPARISON:  01/04/2020 FINDINGS: Normal heart size and mild aortic tortuosity. Widening of the upper right mediastinum is from vessels based on stability and prior imaging. Hyperinflation with diaphragm flattening. There is no edema, consolidation, effusion, or pneumothorax. IMPRESSION: COPD without acute superimposed finding. Electronically Signed   By: Marnee Spring M.D.   On: 01/09/2020 07:31    Assessment / Plan: 10. 74 year old male admitted to the hospital 8/14 with abdominal pain, hematemesis/melena and 20- 30lb weight loss. S/P ED  severe esophagitis, HH, gastritis, clean-based/non-bleeding DUs, congested duodenal mucosa. Pathology report showed reactive gastropathy, no HP.  Peptic duodenitis.  Inflammation, granulation in esophagus. CTAP 8/18 due to complaints of abdominal pain was negative, no evidence no any acute intra-abdominal or intrapelvic process. On exam today, he passed a moderate amount of dark brown stool with brick red blood throughout the stool, bed linen saturated with red blood. He is tachycardic. BP stable. I & O record 10pm documented red blood emesis. Patient vomited this am after eating breakfast, no hematemesis as  confirmed by day shift RN.  -Stat H/H -Change PPI to IV bid -Clear liquid diet for now -Continue Zofran 4mg  IV Q 6 hrs PRN -Consider repeat EGD +/- colonoscopy, await further recommendations per Dr. -Recommend keeping patient in stepdown for now  ADDENDUM: Repeat Hg down to  8.9 at 12:49pm. Plan for repeat EGD with Dr. Rhea Belton tomorrow. Patient consents to repeat EGD.   2. Anemia. Hg dropped to 6.8 on 8/17. He received 2 units of PRBCs (total of 3 units of PRBCs since admission) Post transfusion Hg 9.8. Today Hg 9.4. MCV 82.9.  -See plan in # 1  3. Leukocytosis. Temp 100.2 at 4am. Tachycardic. WBC 16 -> 17.5 Blood cultures drawn. Chest xray to day showed COPD without acute superimposed finding.  4. COPD  5. History of a CVA   Active Problems:   Gastrointestinal hemorrhage   Hypokalemia   Leukocytosis   Acute esophagitis   Malnutrition of moderate degree     LOS: 5 days   9/17  01/09/2020, 12:03 PM

## 2020-01-09 NOTE — Progress Notes (Signed)
PROGRESS NOTE    [name]  VFI:[id number] DOB: 08-02-1945 DOA: 01/04/2020 PCP: [name], MD   Brief Narrative:  [name] is a 74 y.o. male with medical history significant of peptic ulcer disease admitted with abdominal pain, nausea, vomiting and black stools. Pt reports he has not been able to eat or drink anything for the last 4 days not been able to keep it down as he was vomiting.  Significant weight loss.  Pt takes Goody powder and aspirin and multiple antihypertensives at home. He is also on Suboxone at home. ED Course: His potassium was 2.5 sodium was 135 BUN 87 creatinine 6.47 anion gap was 26, white count 22.2, hemoglobin 7.8 platelets 496 INR 1.3 Blood pressure 95/56 came up to 105/ 54 heart rate 90 respiration 19 sats 99% on 2 L temperature 98.7. He was seen by PCCM in the ER and asked hospitalist to admit the patient.  Assessment & Plan:   Active Problems:   Gastrointestinal hemorrhage   Hypokalemia   Leukocytosis   Acute esophagitis   Malnutrition of moderate degree   Upper GI bleed  GI on board, s/p EGD showed esophagitis with bleeding which was biopsied.  Hiatal hernia and gastritis.  Nonbleeding duodenal ulcer with a clean ulcer base CT abdomen/pelvis unremarkable GI plans to repeat another EGD on 01/10/2020 Continue PPI, carafate   Acute blood loss anemia Likely 2/2 above Hemoglobin dropped to 6.7 on 8/17, transfused 2 units of PRBC Daily CBC  Hypokalemia/hypomagnesemia Replace as needed Daily BMP, magnesium  Leukocytosis Afebrile, unknown etiology, continues to complain of abdominal pain Chest x-ray unremarkable UA with large leukocytes, rare bacteria, greater than 50 WBC Procalcitonin negative, unlikely bacterial cause of infection CT abdomen/pelvis unremarkable Daily CBC  Resolved AKI with acidosis/hypernatremia Likely 2/2 poor oral intake Daily BMP  Hypertension Continue Norvasc  Urinary retention with history of BPH   Plan for Foley removal, monitor closely  History of polysubstance abuse Urine drug screen positive for cocaine Continue Suboxone      Estimated body mass index is 21.77 kg/m as calculated from the following:   Height as of this encounter: 6' (1.829 m).   Weight as of this encounter: 72.8 kg.  DVT prophylaxis: SCD  code Status full code  family Communication: None at bedside Disposition Plan:  Status is: Inpatient  Dispo: The patient is from: Home              Anticipated d/c is to: Home              Anticipated d/c date is more than 2 days              Patient currently is not medically stable to d/c.     Consultants:   GI  Procedures: EGD 01/05/2020  Antimicrobials: None  Subjective: Patient continues to complain about some abdominal pain, noted to have possible hematemesis on 01/08/2020 (provider unaware), with noted blood in stool on 01/09/2020.  Denies any further hematemesis today, denies any chest pain, fever/chills.   objective: Vitals:   01/09/20 0855 01/09/20 0900 01/09/20 1000 01/09/20 1200  BP:   (!) 153/81   Pulse:  85 96   Resp:  20 (!) 22   Temp: 99.7 F (37.6 C)   98.8 F (37.1 C)  TempSrc: Oral   Oral  SpO2:  96% 99%   Weight:      Height:        Intake/Output Summary (Last 24 hours) at 01/09/2020 1625  Last data filed at 01/09/2020 1200 Gross per 24 hour  Intake --  Output 2250 ml  Net -2250 ml   Filed Weights   01/06/20 1310  Weight: 72.8 kg    Examination:  General:  Chronically ill-appearing, stutters, noted to be intermittently teary  Cardiovascular: S1, S2 present  Respiratory: CTAB  Abdomen: Soft, generalized tenderness, nondistended, bowel sounds present  Musculoskeletal: No bilateral pedal edema noted  Skin: Normal  Psychiatry:  Fair mood     Data Reviewed: I have personally reviewed following labs and imaging studies  CBC: Recent Labs  Lab 01/04/20 1258 01/04/20 1258 01/05/20 0237 01/05/20 1940  01/06/20 0308 01/07/20 0728 01/07/20 2212 01/09/20 0613 01/09/20 1249  WBC 22.2*  --  16.4*  --  12.9* 16.0*  --  17.5*  --   NEUTROABS 18.7*  --   --   --   --   --   --  14.6*  --   HGB 7.8*   < > 8.3*   < > 7.6* 6.8* 9.8* 9.4* 8.9*  HCT 25.8*   < > 26.7*   < > 25.2* 22.6* 31.2* 31.0* 28.4*  MCV 72.9*  --  77.8*  --  80.0 82.5  --  82.9  --   PLT 496*  --  349  --  341 323  --  325  --    < > = values in this interval not displayed.   Basic Metabolic Panel: Recent Labs  Lab 01/05/20 0237 01/06/20 0308 01/06/20 0559 01/07/20 0241 01/07/20 0728 01/08/20 0237 01/09/20 0613  NA 143 154*  --   --  149* 149* 141  K 2.9* 2.8*  --   --  3.0* 3.4* 3.2*  CL 107 120*  --   --  120* 118* 110  CO2 18* 19*  --   --  19* 18* 21*  GLUCOSE 116* 115*  --   --  152* 115* 116*  BUN 194* 84*  --   --  31* 16 15  CREATININE 3.49* 1.41*  --   --  0.97 0.82 0.74  CALCIUM 8.4* 8.7*  --   --  8.0* 7.9* 7.7*  MG  --   --  1.6* 1.7  --  1.2* 1.5*  PHOS  --   --  3.0 2.1*  --  2.3* 2.6   GFR: Estimated Creatinine Clearance: 87.2 mL/min (by C-G formula based on SCr of 0.74 mg/dL). Liver Function Tests: Recent Labs  Lab 01/04/20 1258 01/05/20 0237 01/06/20 0308  AST 16 16 15   ALT 15 13 13   ALKPHOS 65 50 49  BILITOT 0.6 0.6 0.8  PROT 7.5 6.3* 6.5  ALBUMIN 2.7* 2.4* 2.5*   Recent Labs  Lab 01/04/20 1258  LIPASE 26   No results for input(s): AMMONIA in the last 168 hours. Coagulation Profile: Recent Labs  Lab 01/04/20 1258  INR 1.3*   Cardiac Enzymes: No results for input(s): CKTOTAL, CKMB, CKMBINDEX, TROPONINI in the last 168 hours. BNP (last 3 results) No results for input(s): PROBNP in the last 8760 hours. HbA1C: No results for input(s): HGBA1C in the last 72 hours. CBG: No results for input(s): GLUCAP in the last 168 hours. Lipid Profile: No results for input(s): CHOL, HDL, LDLCALC, TRIG, CHOLHDL, LDLDIRECT in the last 72 hours. Thyroid Function Tests: No results for  input(s): TSH, T4TOTAL, FREET4, T3FREE, THYROIDAB in the last 72 hours. Anemia Panel: No results for input(s): VITAMINB12, FOLATE, FERRITIN, TIBC, IRON, RETICCTPCT in the  last 72 hours. Sepsis Labs: Recent Labs  Lab 01/04/20 1301 01/05/20 0237 01/06/20 0308 01/09/20 0752  PROCALCITON  --  2.68 0.91 <0.10  LATICACIDVEN 1.8  --   --   --     Recent Results (from the past 240 hour(s))  SARS Coronavirus 2 by RT PCR (hospital order, performed in [facility] hospital lab) Nasopharyngeal Nasopharyngeal Swab     Status: None   Collection Time: 01/04/20 12:58 PM   Specimen: Nasopharyngeal Swab  Result Value Ref Range Status   SARS Coronavirus 2 NEGATIVE NEGATIVE Final    Comment: (NOTE) SARS-CoV-2 target nucleic acids are NOT DETECTED.  The SARS-CoV-2 RNA is generally detectable in upper and lower respiratory specimens during the acute phase of infection. The lowest concentration of SARS-CoV-2 viral copies this assay can detect is 250 copies / mL. A negative result does not preclude SARS-CoV-2 infection and should not be used as the sole basis for treatment or other patient management decisions.  A negative result may occur with improper specimen collection / handling, submission of specimen other than nasopharyngeal swab, presence of viral mutation(s) within the areas targeted by this assay, and inadequate number of viral copies (<250 copies / mL). A negative result must be combined with clinical observations, patient history, and epidemiological information.  Fact Sheet for Patients:   BoilerBrush.com.cy  Fact Sheet for Healthcare Providers: https://pope.com/  This test is not yet approved or  cleared by the [place] FDA and has been authorized for detection and/or diagnosis of SARS-CoV-2 by FDA under an Emergency Use Authorization (EUA).  This EUA will remain in effect (meaning this test can be used) for the duration of  the COVID-19 declaration under Section 564(b)(1) of the Act, 21 U.S.C. section 360bbb-3(b)(1), unless the authorization is terminated or revoked sooner.  Performed at [facility], [address], [place], [place] [address]   MRSA PCR Screening     Status: Abnormal   Collection Time: 01/04/20 11:28 PM   Specimen: Nasopharyngeal  Result Value Ref Range Status   MRSA by PCR POSITIVE (A) NEGATIVE Final    Comment:        The GeneXpert MRSA Assay (FDA approved for NASAL specimens only), is one component of a comprehensive MRSA colonization surveillance program. It is not intended to diagnose MRSA infection nor to guide or monitor treatment for MRSA infections. RESULT CALLED TO, READ BACK BY AND VERIFIED WITH: A,ROTHERMEL AT 0118 ON 01/05/20 BY A,MOHAMED Performed at [facility], [address], [place], [place] [address]          Radiology Studies: CT ABDOMEN PELVIS W CONTRAST  Result Date: 01/08/2020 CLINICAL DATA:  Acute abdominal pain, unintended weight loss EXAM: CT ABDOMEN AND PELVIS WITH CONTRAST TECHNIQUE: Multidetector CT imaging of the abdomen and pelvis was performed using the standard protocol following bolus administration of intravenous contrast. CONTRAST:  OMNIPAQUE IOHEXOL 300 MG/ML  SOLN COMPARISON:  01/07/2020, 09/07/2014 FINDINGS: Lower chest: No acute pleural or parenchymal lung disease. Hepatobiliary: No focal liver abnormality is seen. No gallstones, gallbladder wall thickening, or biliary dilatation. Pancreas: Unremarkable. No pancreatic ductal dilatation or surrounding inflammatory changes. Spleen: Normal in size without focal abnormality. Adrenals/Urinary Tract: Stable nodularity of the left adrenal gland is nonspecific but likely reflects adenoma given stability. Right adrenal is grossly normal. Stable subcentimeter left renal hypodensity likely a cyst. Otherwise the kidneys enhance normally and symmetrically. No  urinary tract calculi or obstructive uropathy. Foley catheter is seen within the  bladder, with no filling defects. Small bladder diverticula are identified consistent with chronic bladder outlet obstruction. Stomach/Bowel: No bowel obstruction or ileus. No bowel wall thickening or inflammatory change. Large diverticulum off the third portion duodenum is unchanged. No evidence of inflammation. Small hiatal hernia is noted. Vascular/Lymphatic: There is extensive atherosclerosis of the aorta and its branches. Retroaortic left renal vein is noted, a frequent anatomic variant. No pathologic adenopathy. Reproductive: Prostate is not enlarged. Other: No free fluid or free gas. No abdominal wall hernia. Musculoskeletal: No acute or destructive bony lesions. Reconstructed images demonstrate no additional findings. IMPRESSION: 1. No acute intra-abdominal or intrapelvic process. 2. Small hiatal hernia. 3. Aortic Atherosclerosis (ICD10-I70.0). Electronically Signed   By: Sharlet SalinaMichael  Brown M.D.   On: 01/08/2020 20:55   DG Chest Port 1 View  Result Date: 01/09/2020 CLINICAL DATA:  Leukocytosis EXAM: PORTABLE CHEST 1 VIEW COMPARISON:  01/04/2020 FINDINGS: Normal heart size and mild aortic tortuosity. Widening of the upper right mediastinum is from vessels based on stability and prior imaging. Hyperinflation with diaphragm flattening. There is no edema, consolidation, effusion, or pneumothorax. IMPRESSION: COPD without acute superimposed finding. Electronically Signed   By: Marnee SpringJonathon  Watts M.D.   On: 01/09/2020 07:31        Scheduled Meds: . amLODipine  5 mg Oral Daily  . buprenorphine-naloxone  1 tablet Sublingual Daily  . Chlorhexidine Gluconate Cloth  6 each Topical Daily  . feeding supplement (KATE FARMS STANDARD 1.4)  325 mL Oral BID BM  . lidocaine  15 mL Oral Once  . mouth rinse  15 mL Mouth Rinse BID  . mometasone-formoterol  2 puff Inhalation BID  . multivitamin with minerals  1 tablet Oral Daily  .  mupirocin ointment  1 application Nasal BID  . pantoprazole (PROTONIX) IV  40 mg Intravenous Q12H  . polyethylene glycol  17 g Oral BID  . potassium chloride  40 mEq Oral Daily  . senna-docusate  1 tablet Oral BID  . sucralfate  1 g Oral QID  . tamsulosin  0.4 mg Oral Daily   Continuous Infusions:    LOS: 5 days     Briant CedarNkeiruka J Cash Duce, MD 01/09/2020, 4:25 PM

## 2020-01-10 ENCOUNTER — Telehealth: Payer: Self-pay

## 2020-01-10 ENCOUNTER — Inpatient Hospital Stay (HOSPITAL_COMMUNITY): Payer: Medicare Other | Admitting: Anesthesiology

## 2020-01-10 ENCOUNTER — Encounter (HOSPITAL_COMMUNITY)
Admission: EM | Disposition: A | Discharge status: Skilled Nursing Facility | Payer: Medicare Other | Source: Home / Self Care | Attending: Internal Medicine

## 2020-01-10 ENCOUNTER — Encounter (HOSPITAL_COMMUNITY): Payer: Self-pay | Admitting: Internal Medicine

## 2020-01-10 DIAGNOSIS — K2100 Gastro-esophageal reflux disease with esophagitis, without bleeding: Secondary | ICD-10-CM

## 2020-01-10 HISTORY — PX: ESOPHAGOGASTRODUODENOSCOPY (EGD) WITH PROPOFOL: SHX5813

## 2020-01-10 LAB — BLOOD CULTURE ID PANEL (REFLEXED) - BCID2

## 2020-01-10 LAB — BASIC METABOLIC PANEL
Anion gap: 9 (ref 5–15)
BUN: 11 mg/dL (ref 8–23)
CO2: 19 mmol/L — ABNORMAL LOW (ref 22–32)
Calcium: 7.7 mg/dL — ABNORMAL LOW (ref 8.9–10.3)
Chloride: 112 mmol/L — ABNORMAL HIGH (ref 98–111)
Creatinine, Ser: 0.67 mg/dL (ref 0.61–1.24)
GFR calc Af Amer: >60 mL/min (ref >60)
GFR calc non Af Amer: >60 mL/min (ref >60)
Glucose, Bld: 102 mg/dL — ABNORMAL HIGH (ref 70–99)
Potassium: 3.4 mmol/L — ABNORMAL LOW (ref 3.5–5.1)
Sodium: 140 mmol/L (ref 135–145)

## 2020-01-10 LAB — TYPE AND SCREEN
ABO/RH(D): A POS
Antibody Screen: NEGATIVE

## 2020-01-10 LAB — CBC WITH DIFFERENTIAL/PLATELET
Abs Immature Granulocytes: 0.12 10*3/uL — ABNORMAL HIGH (ref 0.00–0.07)
Basophils Absolute: 0 10*3/uL (ref 0.0–0.1)
Basophils Relative: 0 %
Eosinophils Absolute: 0.1 10*3/uL (ref 0.0–0.5)
Eosinophils Relative: 1 %
HCT: 27.4 % — ABNORMAL LOW (ref 39.0–52.0)
Hemoglobin: 8.4 g/dL — ABNORMAL LOW (ref 13.0–17.0)
Immature Granulocytes: 1 %
Lymphocytes Relative: 12 %
Lymphs Abs: 1.6 10*3/uL (ref 0.7–4.0)
MCH: 24.8 pg — ABNORMAL LOW (ref 26.0–34.0)
MCHC: 30.7 g/dL (ref 30.0–36.0)
MCV: 80.8 fL (ref 80.0–100.0)
Monocytes Absolute: 0.8 10*3/uL (ref 0.1–1.0)
Monocytes Relative: 6 %
Neutro Abs: 11 10*3/uL — ABNORMAL HIGH (ref 1.7–7.7)
Neutrophils Relative %: 80 %
Platelets: 178 10*3/uL (ref 150–400)
RBC: 3.39 MIL/uL — ABNORMAL LOW (ref 4.22–5.81)
RDW: 19.8 % — ABNORMAL HIGH (ref 11.5–15.5)
WBC: 13.6 10*3/uL — ABNORMAL HIGH (ref 4.0–10.5)
nRBC: 0 % (ref 0.0–0.2)

## 2020-01-10 LAB — MAGNESIUM: Magnesium: 1.4 mg/dL — ABNORMAL LOW (ref 1.7–2.4)

## 2020-01-10 LAB — PHOSPHORUS: Phosphorus: 2.7 mg/dL (ref 2.5–4.6)

## 2020-01-10 SURGERY — ESOPHAGOGASTRODUODENOSCOPY (EGD) WITH PROPOFOL
Anesthesia: Monitor Anesthesia Care

## 2020-01-10 MED ORDER — MAGNESIUM SULFATE 2 GM/50ML IV SOLN
2.0000 g | Freq: Once | INTRAVENOUS | Status: AC
  Administered 2020-01-10: 2 g via INTRAVENOUS
  Filled 2020-01-10: qty 50

## 2020-01-10 MED ORDER — PROPOFOL 10 MG/ML IV BOLUS
INTRAVENOUS | Status: AC
  Filled 2020-01-10: qty 40

## 2020-01-10 MED ORDER — LIDOCAINE 2% (20 MG/ML) 5 ML SYRINGE
INTRAMUSCULAR | Status: DC | PRN
  Administered 2020-01-10: 60 mg via INTRAVENOUS

## 2020-01-10 MED ORDER — TRAMADOL HCL 50 MG PO TABS
50.0000 mg | ORAL_TABLET | Freq: Once | ORAL | Status: AC
  Administered 2020-01-10: 50 mg via ORAL
  Filled 2020-01-10: qty 1

## 2020-01-10 MED ORDER — PANTOPRAZOLE SODIUM 40 MG PO TBEC
40.0000 mg | DELAYED_RELEASE_TABLET | Freq: Two times a day (BID) | ORAL | Status: DC
  Administered 2020-01-10 – 2020-01-17 (×16): 40 mg via ORAL
  Filled 2020-01-10 (×17): qty 1

## 2020-01-10 MED ORDER — VANCOMYCIN HCL IN DEXTROSE 1-5 GM/200ML-% IV SOLN
1000.0000 mg | Freq: Two times a day (BID) | INTRAVENOUS | Status: DC
  Administered 2020-01-10 – 2020-01-15 (×10): 1000 mg via INTRAVENOUS
  Filled 2020-01-10 (×12): qty 200

## 2020-01-10 MED ORDER — VANCOMYCIN HCL 1500 MG/300ML IV SOLN
1500.0000 mg | INTRAVENOUS | Status: AC
  Administered 2020-01-10: 1500 mg via INTRAVENOUS
  Filled 2020-01-10: qty 300

## 2020-01-10 MED ORDER — PROPOFOL 500 MG/50ML IV EMUL
INTRAVENOUS | Status: DC | PRN
  Administered 2020-01-10: 40 mg via INTRAVENOUS

## 2020-01-10 MED ORDER — PROPOFOL 500 MG/50ML IV EMUL
INTRAVENOUS | Status: DC | PRN
  Administered 2020-01-10: 100 ug/kg/min via INTRAVENOUS

## 2020-01-10 MED ORDER — ONDANSETRON HCL 4 MG/2ML IJ SOLN
INTRAMUSCULAR | Status: DC | PRN
  Administered 2020-01-10: 4 mg via INTRAVENOUS

## 2020-01-10 MED ORDER — LACTATED RINGERS IV SOLN: INTRAVENOUS | Status: DC

## 2020-01-10 SURGICAL SUPPLY — 15 items

## 2020-01-10 NOTE — Transfer of Care (Signed)
Immediate Anesthesia Transfer of Care Note  Patient: [name]  Procedure(s) Performed: Procedure(s): ESOPHAGOGASTRODUODENOSCOPY (EGD) WITH PROPOFOL (N/A)  Patient Location: Endoscopy Unit  Anesthesia Type:MAC  Level of Consciousness:  sedated, patient cooperative and responds to stimulation  Airway & Oxygen Therapy:Patient Spontanous Breathing and Patient connected to face mask oxgen  Post-op Assessment:  Report given to PACU RN and Post -op Vital signs reviewed and stable  Post vital signs:  Reviewed and stable  Last Vitals:  Vitals:   01/10/20 0824 01/10/20 0920  BP: (!) 153/77 124/86  Pulse: 74   Resp: 16   Temp: 37.3 C   SpO2: 09%     Complications: No apparent anesthesia complications

## 2020-01-10 NOTE — Telephone Encounter (Signed)
An appt has been made for the pt to see Dr Meridee Score.  He will be notified at discharge.

## 2020-01-10 NOTE — Progress Notes (Signed)
Pharmacy Antibiotic Note  [name] is a 74 y.o. male admitted on 01/04/2020 with GI bleed and leukocytosis.  Pharmacy has been consulted for Vancomycin dosing for +BCID S. Epidermidis & S.Lugdunesis with +meth resistance.  - WBC 13.6 - Temp 100.8  Plan:  Vancomycin 1500mg  IV x 1 then 1000mg  IV q12h  Follow renal function  Follow culture sensitivities  Monitor vancomycin levels as needed  Height: 6' (182.9 cm) Weight: (P) 70.3 kg (154 lb 15.7 oz) IBW/kg (Calculated) : 77.6  Temp (24hrs), Avg:99.6 F (37.6 C), Min:98.7 F (37.1 C), Max:100.8 F (38.2 C)  Recent Labs  Lab 01/04/20 1258 01/04/20 1301 01/05/20 0237 01/05/20 0237 01/06/20 0308 01/07/20 0728 01/08/20 0237 01/09/20 0613 01/10/20 0222  WBC   < >  --  16.4*  --  12.9* 16.0*  --  17.5* 13.6*  CREATININE   < >  --  3.49*   < > 1.41* 0.97 0.82 0.74 0.67  LATICACIDVEN  --  1.8  --   --   --   --   --   --   --    < > = values in this interval not displayed.    Estimated Creatinine Clearance: 87.2 mL/min (by C-G formula based on SCr of 0.67 mg/dL).    No Known Allergies  Antimicrobials this admission: 8/20 Vanc >>     Dose adjustments this admission:    Microbiology results: 8/20 BCx: S. Epidermidis & S.Lugdunesis with +meth resistance 8/14 MRSA PCR: positive  Thank you for allowing pharmacy to be a part of this patient's care.  9/20, PharmD 01/10/2020 3:45 AM

## 2020-01-10 NOTE — Progress Notes (Signed)
Occupational Therapy Evaluation  Patient with functional deficits listed below impacting safety and independence with self care. Patient requiring min A for bed mobility and mod A with multimodal cues for hand placement not to pull up on walker. Patient able to take few small steps with walker to recliner, poor eccentric control and follow through on cues to reach back for chair arm. Patient sighs when sitting down, when asked what's wrong patient states feels short of breath (sat 99% on RA) and not a lot of energy. Currently recommend SNF rehab due to increased assist for self care and unsure how much assistance patient's brother can provide. Will continue to follow.    01/10/20 1500  OT Visit Information  Last OT Received On 01/10/20  Assistance Needed +1  History of Present Illness [name] is a 74 y.o. male with medical history significant of peptic ulcer disease admitted with nausea vomiting and black stools. Upper GI bleed EGD shows esophagitis with bleeding which was biopsied.  Hiatal hernia and gastritis.  Nonbleeding duodenal ulcer  Precautions  Precautions Fall  Restrictions  Weight Bearing Restrictions No  Home Living  Family/patient expects to be discharged to: Private residence  Living Arrangements Other relatives  Available Help at Discharge Family  Type of Home House  Home Access Stairs to enter  Home Layout One level  Bathroom Shower/Tub Tub/shower unit  Home Equipment None  Prior Function  Level of Independence Independent  Communication  Communication No difficulties  Pain Assessment  Pain Assessment Faces  Faces Pain Scale 2  Pain Location abd  Pain Descriptors / Indicators Grimacing;Guarding  Pain Intervention(s) Monitored during session  Cognition  Arousal/Alertness Awake/alert  Behavior During Therapy Flat affect  Overall Cognitive Status No family/caregiver present to determine baseline cognitive functioning  Area of Impairment Following commands   Following Commands Follows one step commands consistently;Follows one step commands with increased time  General Comments patient minimally interactive/conversive with OT  Upper Extremity Assessment  Upper Extremity Assessment Generalized weakness  Lower Extremity Assessment  Lower Extremity Assessment Defer to PT evaluation  Cervical / Trunk Assessment  Cervical / Trunk Assessment Normal  ADL  Overall ADL's  Needs assistance/impaired  Grooming Set up;Sitting  Upper Body Bathing Set up;Sitting  Lower Body Bathing Moderate assistance;Sitting/lateral leans;Sit to/from stand  Upper Body Dressing  Set up;Sitting  Lower Body Dressing Moderate assistance;Sitting/lateral leans;Sit to/from stand  Lower Body Dressing Details (indicate cue type and reason) limited activity tolerance, soreness in abdomen  Toilet Transfer Moderate assistance;Cueing for safety;Cueing for sequencing;Ambulation;BSC;RW  Toilet Transfer Details (indicate cue type and reason) patient able to take few steps from EOB to recliner chair, mod cues for hand placement during transfer, limited activity tolerance  Toileting- Clothing Manipulation and Hygiene Moderate assistance;Sitting/lateral lean;Sit to/from stand  Functional mobility during ADLs Moderate assistance;Rolling walker;Cueing for safety;Cueing for sequencing  General ADL Comments patient requiring increased assistance with self care due to decreased activity tolerance, safety awareness, balance  Bed Mobility  Overal bed mobility Needs Assistance  Bed Mobility Supine to Sit  Supine to sit Min assist  General bed mobility comments  assistance with trunk,multimodal cues  Transfers  Overall transfer level Needs assistance  Equipment used Rolling walker (2 wheeled)  Transfers Sit to/from Stand;Stand Pivot Transfers  Sit to Stand Mod assist  Stand pivot transfers Mod assist  General transfer comment patient able to take small steps to recliner, mod A for powering up  to standing and cues for hand placement not to pull on  walker. once seated in chair patient lets out big sigh, when prompted pt states feeling a little short of breath (O2 reading 99%) and lack of energy  Balance  Overall balance assessment Needs assistance  Sitting-balance support Feet supported  Sitting balance-Leahy Scale Fair  Standing balance support During functional activity;Bilateral upper extremity supported  Standing balance-Leahy Scale Poor  OT - End of Session  Equipment Utilized During Treatment Rolling walker  Activity Tolerance Patient limited by fatigue  Patient left in chair;with call bell/phone within reach;with chair alarm set;with nursing/sitter in room  Nurse Communication Mobility status  OT Assessment  OT Recommendation/Assessment Patient needs continued OT Services  OT Visit Diagnosis Unsteadiness on feet (R26.81);Muscle weakness (generalized) (M62.81);Pain  Pain - part of body  (abdomen)  OT Problem List Decreased strength;Decreased activity tolerance;Impaired balance (sitting and/or standing);Decreased safety awareness;Decreased knowledge of use of DME or AE;Pain  OT Plan  OT Frequency (ACUTE ONLY) Min 2X/week  OT Treatment/Interventions (ACUTE ONLY) Therapeutic exercise;Self-care/ADL training;Energy conservation;DME and/or AE instruction;Therapeutic activities;Patient/family education;Balance training  AM-PAC OT "6 Clicks" Daily Activity Outcome Measure (Version 2)  Help from another person eating meals? 4  Help from another person taking care of personal grooming? 3  Help from another person toileting, which includes using toliet, bedpan, or urinal? 2  Help from another person bathing (including washing, rinsing, drying)? 2  Help from another person to put on and taking off regular upper body clothing? 3  Help from another person to put on and taking off regular lower body clothing? 2  6 Click Score 16  OT Recommendation  Follow Up Recommendations SNF  OT  Equipment Other (comment) (defer to next venue)  Individuals Consulted  Consulted and Agree with Results and Recommendations Patient  Acute Rehab OT Goals  Patient Stated Goal agreeable to up to chair  OT Goal Formulation With patient  Time For Goal Achievement 01/24/20  Potential to Achieve Goals Good  OT Time Calculation  OT Start Time (ACUTE ONLY) 1055  OT Stop Time (ACUTE ONLY) 1109  OT Time Calculation (min) 14 min  OT General Charges  $OT Visit 1 Visit  OT Evaluation  $OT Eval Low Complexity 1 Low  Written Expression  Dominant Hand  (did not specify )   Marlyce Huge OT OT pager: [phone]

## 2020-01-10 NOTE — Anesthesia Preprocedure Evaluation (Signed)
Anesthesia Evaluation  Patient identified by MRN, date of birth, ID band Patient awake    Reviewed: Allergy & Precautions, NPO status , Patient's Chart, lab work & pertinent test results  Airway Mallampati: II  TM Distance: >3 FB Neck ROM: Full    Dental  (+) Dental Advisory Given, Lower Dentures, Upper Dentures   Pulmonary asthma , COPD,  COPD inhaler, former smoker,    Pulmonary exam normal breath sounds clear to auscultation       Cardiovascular hypertension, Pt. on medications Normal cardiovascular exam Rhythm:Regular Rate:Normal     Neuro/Psych  Headaches, CVA negative psych ROS   GI/Hepatic Neg liver ROS, PUD, GERD  Medicated,Melena   Endo/Other  negative endocrine ROS  Renal/GU Renal disease (AKI)     Musculoskeletal  (+) Arthritis ,   Abdominal   Peds  Hematology  (+) Blood dyscrasia, anemia ,   Anesthesia Other Findings Day of surgery medications reviewed with the patient.  Reproductive/Obstetrics                             Anesthesia Physical  Anesthesia Plan  ASA: III  Anesthesia Plan: MAC   Post-op Pain Management:    Induction: Intravenous  PONV Risk Score and Plan: 1 and Propofol infusion and Treatment may vary due to age or medical condition  Airway Management Planned: Nasal Cannula and Natural Airway  Additional Equipment:   Intra-op Plan:   Post-operative Plan:   Informed Consent: I have reviewed the patients History and Physical, chart, labs and discussed the procedure including the risks, benefits and alternatives for the proposed anesthesia with the patient or authorized representative who has indicated his/her understanding and acceptance.     Dental advisory given  Plan Discussed with: CRNA and Anesthesiologist  Anesthesia Plan Comments:         Anesthesia Quick Evaluation

## 2020-01-10 NOTE — Interval H&P Note (Signed)
History and Physical Interval Note:  01/10/2020 8:51 AM  [name]  has presented today for surgery, with the diagnosis of Anemia, erosive esophagitis, duodenal ulcer, heme + stool.  The various methods of treatment have been discussed with the patient and family. After consideration of risks, benefits and other options for treatment, the patient has consented to  Procedure(s): ESOPHAGOGASTRODUODENOSCOPY (EGD) WITH PROPOFOL (N/A) as a surgical intervention.  The patient's history has been reviewed, patient examined, no change in status, stable for surgery.  I have reviewed the patient's chart and labs.  Questions were answered to the patient's satisfaction.     Rachael Fee

## 2020-01-10 NOTE — TOC Progression Note (Signed)
Transition of Care [facility]) - Progression Note    Patient Details  Name: [name] MRN: [id number] Date of Birth: 1945-08-11  [facility] CM/SW Contact  Golda Acre, RN Phone Number: 01/10/2020, 8:41 AM  Clinical Narrative:    Npo for endo this am, iv vancomycin, bld culture +, wbc 13.6, hgb 8.4 Following for progression and toc needs.  Expected Discharge Plan: Home/Self Care Barriers to Discharge: Continued Medical Work up  Expected Discharge Plan and Services Expected Discharge Plan: Home/Self Care   Discharge Planning Services: CM Consult   Living arrangements for the past 2 months: Single Family Home                                       Social Determinants of Health (SDOH) Interventions    Readmission Risk Interventions No flowsheet data found.

## 2020-01-10 NOTE — Telephone Encounter (Signed)
-----   Message from Rachael Fee, MD sent at 01/10/2020  9:25 AM EDT ----- [name], He is probably going home this weekend. Needs OV with GM or an extender in 6-7 weeks.  Please remind him to take the PPI twice daily.   Thanks

## 2020-01-10 NOTE — Progress Notes (Signed)
PHARMACY - PHYSICIAN COMMUNICATION CRITICAL VALUE ALERT - BLOOD CULTURE IDENTIFICATION (BCID)  [name] is an 74 y.o. male who presented to [facility] on 01/04/2020 with a chief complaint of abdominal pain, N/V, black stools.  Assessment:  S. Epidermidis and S. Lugdenesis with methicillin resistance detected   Name of physician (or Provider) Contacted: M. Katherina Right, FNP  Current antibiotics: none  Changes to prescribed antibiotics recommended:  Recommendations accepted by provider  Begin vancomycin per pharmacy protocol  Results for orders placed or performed during the hospital encounter of 01/04/20  Blood Culture ID Panel (Reflexed) (Collected: 01/09/2020  6:13 AM)  Result Value Ref Range   Enterococcus faecalis NOT DETECTED NOT DETECTED   Enterococcus Faecium NOT DETECTED NOT DETECTED   Listeria monocytogenes NOT DETECTED NOT DETECTED   Staphylococcus species DETECTED (A) NOT DETECTED   Staphylococcus aureus (BCID) NOT DETECTED NOT DETECTED   Staphylococcus epidermidis DETECTED (A) NOT DETECTED   Staphylococcus lugdunensis DETECTED (A) NOT DETECTED   Streptococcus species NOT DETECTED NOT DETECTED   Streptococcus agalactiae NOT DETECTED NOT DETECTED   Streptococcus pneumoniae NOT DETECTED NOT DETECTED   Streptococcus pyogenes NOT DETECTED NOT DETECTED   A.calcoaceticus-baumannii NOT DETECTED NOT DETECTED   Bacteroides fragilis NOT DETECTED NOT DETECTED   Enterobacterales NOT DETECTED NOT DETECTED   Enterobacter cloacae complex NOT DETECTED NOT DETECTED   Escherichia coli NOT DETECTED NOT DETECTED   Klebsiella aerogenes NOT DETECTED NOT DETECTED   Klebsiella oxytoca NOT DETECTED NOT DETECTED   Klebsiella pneumoniae NOT DETECTED NOT DETECTED   Proteus species NOT DETECTED NOT DETECTED   Salmonella species NOT DETECTED NOT DETECTED   Serratia marcescens NOT DETECTED NOT DETECTED   Haemophilus influenzae NOT DETECTED NOT DETECTED   Neisseria meningitidis NOT DETECTED NOT  DETECTED   Pseudomonas aeruginosa NOT DETECTED NOT DETECTED   Stenotrophomonas maltophilia NOT DETECTED NOT DETECTED   Candida albicans NOT DETECTED NOT DETECTED   Candida auris NOT DETECTED NOT DETECTED   Candida glabrata NOT DETECTED NOT DETECTED   Candida krusei NOT DETECTED NOT DETECTED   Candida parapsilosis NOT DETECTED NOT DETECTED   Candida tropicalis NOT DETECTED NOT DETECTED   Cryptococcus neoformans/gattii NOT DETECTED NOT DETECTED   Methicillin resistance mecA/C DETECTED (A) NOT DETECTED    Ressie Slevin, Joselyn Glassman, PharmD 01/10/2020  3:41 AM

## 2020-01-10 NOTE — Progress Notes (Signed)
PROGRESS NOTE    [name]  ZOX:[id number] DOB: [birth year] DOA: 01/04/2020 PCP: [name], MD   Brief Narrative:  [name] is a 74 y.o. male with medical history significant of peptic ulcer disease admitted with abdominal pain, nausea, vomiting and black stools. Pt reports he has not been able to eat or drink anything for the last 4 days not been able to keep it down as he was vomiting.  Significant weight loss.  Pt takes Goody powder and aspirin and multiple antihypertensives at home. He is also on Suboxone at home. ED Course: His potassium was 2.5 sodium was 135 BUN 87 creatinine 6.47 anion gap was 26, white count 22.2, hemoglobin 7.8 platelets 496 INR 1.3, Blood pressure 95/56 came up to 105/ 54 heart rate 90 respiration 19 sats 99% on 2 L temperature 98.7. He was seen by PCCM in the ER and asked hospitalist to admit the patient.  Assessment & Plan:   Active Problems:   Gastrointestinal hemorrhage   Hypokalemia   Leukocytosis   Acute esophagitis   Malnutrition of moderate degree   Upper GI bleed likely 2/2 bleeding duodenal ulcer  GI on board, s/p EGD showed esophagitis with bleeding which was biopsied.  Hiatal hernia and gastritis.  Nonbleeding duodenal ulcer with a clean ulcer base Repeat EGD on 01/10/20 showed severe acid related esophagitis which is still present, duodenal ulcers are clearly healing CT abdomen/pelvis unremarkable Continue PPI, carafate   Acute blood loss anemia Likely 2/2 above Hemoglobin dropped to 6.7 on 8/17, transfused 2 units of PRBC Daily CBC  Hypokalemia/hypomagnesemia Replace as needed Daily BMP, magnesium  Sepsis likely 2/2 bacteremia 2/2 staph epidermidis/lugdunensis Temp spike on 01/09/20 was 100.8F, with leukocytosis BC X 2 grew staph epidermidis/lugdunensis  Chest x-ray unremarkable UA with large leukocytes, rare bacteria, greater than 50 WBC Procalcitonin negative CT abdomen/pelvis unremarkable Continue  Vancomycin  Daily CBC  Resolved AKI with acidosis/hypernatremia Likely 2/2 poor oral intake Daily BMP  Hypertension Continue Norvasc  Urinary retention with history of BPH  Plan for Foley removal, monitor closely  History of polysubstance abuse Urine drug screen positive for cocaine Continue Suboxone      Estimated body mass index is 21.02 kg/m as calculated from the following:   Height as of this encounter: 6' (1.829 m).   Weight as of this encounter: 70.3 kg.  DVT prophylaxis: SCD  code Status full code  family Communication: None at bedside Disposition Plan:  Status is: Inpatient  Dispo: The patient is from: Home              Anticipated d/c is to: Home              Anticipated d/c date is more than 2 days              Patient currently is not medically stable to d/c.     Consultants:   GI  Procedures: EGD 01/05/2020 and 01/10/20  Antimicrobials: None  Subjective: Patient denies any new complaints. Noted to have a temp spike on 01/09/20   objective: Vitals:   01/10/20 1200 01/10/20 1300 01/10/20 1400 01/10/20 1401  BP: 99/78  123/65   Pulse: (!) 117 98 (!) 106   Resp: 18 20 (!) 25   Temp:    98.5 F (36.9 C)  TempSrc:    Oral  SpO2: 100% 97% 96%   Weight:      Height:        Intake/Output Summary (Last 24 hours)  at 01/10/2020 1652 Last data filed at 01/10/2020 1442 Gross per 24 hour  Intake 49.61 ml  Output 950 ml  Net -900.39 ml   Filed Weights   01/06/20 1310 01/10/20 0306  Weight: 72.8 kg 70.3 kg    Examination:  General:  Chronically ill-appearing, stutters, noted to be intermittently teary  Cardiovascular: S1, S2 present  Respiratory: CTAB  Abdomen: Soft, generalized tenderness, nondistended, bowel sounds present  Musculoskeletal: No bilateral pedal edema noted  Skin: Normal  Psychiatry:  Fair mood     Data Reviewed: I have personally reviewed following labs and imaging studies  CBC: Recent Labs  Lab 01/04/20 1258  01/04/20 1258 01/05/20 0237 01/05/20 1940 01/06/20 0308 01/06/20 0308 01/07/20 0728 01/07/20 2212 01/09/20 0613 01/09/20 1249 01/10/20 0222  WBC 22.2*   < > 16.4*  --  12.9*  --  16.0*  --  17.5*  --  13.6*  NEUTROABS 18.7*  --   --   --   --   --   --   --  14.6*  --  11.0*  HGB 7.8*   < > 8.3*   < > 7.6*   < > 6.8* 9.8* 9.4* 8.9* 8.4*  HCT 25.8*   < > 26.7*   < > 25.2*   < > 22.6* 31.2* 31.0* 28.4* 27.4*  MCV 72.9*   < > 77.8*  --  80.0  --  82.5  --  82.9  --  80.8  PLT 496*   < > 349  --  341  --  323  --  325  --  178   < > = values in this interval not displayed.   Basic Metabolic Panel: Recent Labs  Lab 01/06/20 0308 01/06/20 0559 01/07/20 0241 01/07/20 0728 01/08/20 0237 01/09/20 [id number] 01/10/20 0222  NA 154*  --   --  149* 149* 141 140  K 2.8*  --   --  3.0* 3.4* 3.2* 3.4*  CL 120*  --   --  120* 118* 110 112*  CO2 19*  --   --  19* 18* 21* 19*  GLUCOSE 115*  --   --  152* 115* 116* 102*  BUN 84*  --   --  31* 16 15 11   CREATININE 1.41*  --   --  0.97 0.82 0.74 0.67  CALCIUM 8.7*  --   --  8.0* 7.9* 7.7* 7.7*  MG  --  1.6* 1.7  --  1.2* 1.5* 1.4*  PHOS  --  3.0 2.1*  --  2.3* 2.6 2.7   GFR: Estimated Creatinine Clearance: 84.2 mL/min (by C-G formula based on SCr of 0.67 mg/dL). Liver Function Tests: Recent Labs  Lab 01/04/20 1258 01/05/20 0237 01/06/20 0308  AST 16 16 15   ALT 15 13 13   ALKPHOS 65 50 49  BILITOT 0.6 0.6 0.8  PROT 7.5 6.3* 6.5  ALBUMIN 2.7* 2.4* 2.5*   Recent Labs  Lab 01/04/20 1258  LIPASE 26   No results for input(s): AMMONIA in the last 168 hours. Coagulation Profile: Recent Labs  Lab 01/04/20 1258  INR 1.3*   Cardiac Enzymes: No results for input(s): CKTOTAL, CKMB, CKMBINDEX, TROPONINI in the last 168 hours. BNP (last 3 results) No results for input(s): PROBNP in the last 8760 hours. HbA1C: No results for input(s): HGBA1C in the last 72 hours. CBG: No results for input(s): GLUCAP in the last 168 hours. Lipid  Profile: No results for input(s): CHOL, HDL, LDLCALC, TRIG, CHOLHDL,  LDLDIRECT in the last 72 hours. Thyroid Function Tests: No results for input(s): TSH, T4TOTAL, FREET4, T3FREE, THYROIDAB in the last 72 hours. Anemia Panel: No results for input(s): VITAMINB12, FOLATE, FERRITIN, TIBC, IRON, RETICCTPCT in the last 72 hours. Sepsis Labs: Recent Labs  Lab 01/04/20 1301 01/05/20 0237 01/06/20 0308 01/09/20 0752  PROCALCITON  --  2.68 0.91 <0.10  LATICACIDVEN 1.8  --   --   --     Recent Results (from the past 240 hour(s))  SARS Coronavirus 2 by RT PCR (hospital order, performed in [facility] hospital lab) Nasopharyngeal Nasopharyngeal Swab     Status: None   Collection Time: 01/04/20 12:58 PM   Specimen: Nasopharyngeal Swab  Result Value Ref Range Status   SARS Coronavirus 2 NEGATIVE NEGATIVE Final    Comment: (NOTE) SARS-CoV-2 target nucleic acids are NOT DETECTED.  The SARS-CoV-2 RNA is generally detectable in upper and lower respiratory specimens during the acute phase of infection. The lowest concentration of SARS-CoV-2 viral copies this assay can detect is 250 copies / mL. A negative result does not preclude SARS-CoV-2 infection and should not be used as the sole basis for treatment or other patient management decisions.  A negative result may occur with improper specimen collection / handling, submission of specimen other than nasopharyngeal swab, presence of viral mutation(s) within the areas targeted by this assay, and inadequate number of viral copies (<250 copies / mL). A negative result must be combined with clinical observations, patient history, and epidemiological information.  Fact Sheet for Patients:   BoilerBrush.com.cy  Fact Sheet for Healthcare Providers: https://pope.com/  This test is not yet approved or  cleared by the [place] FDA and has been authorized for detection and/or diagnosis of SARS-CoV-2  by FDA under an Emergency Use Authorization (EUA).  This EUA will remain in effect (meaning this test can be used) for the duration of the COVID-19 declaration under Section 564(b)(1) of the Act, 21 U.S.C. section 360bbb-3(b)(1), unless the authorization is terminated or revoked sooner.  Performed at [facility], [address], [place], [place] [address]   MRSA PCR Screening     Status: Abnormal   Collection Time: 01/04/20 11:28 PM   Specimen: Nasopharyngeal  Result Value Ref Range Status   MRSA by PCR POSITIVE (A) NEGATIVE Final    Comment:        The GeneXpert MRSA Assay (FDA approved for NASAL specimens only), is one component of a comprehensive MRSA colonization surveillance program. It is not intended to diagnose MRSA infection nor to guide or monitor treatment for MRSA infections. RESULT CALLED TO, READ BACK BY AND VERIFIED WITH: A,ROTHERMEL AT 0118 ON 01/05/20 BY A,MOHAMED Performed at [facility], [address] [place], [place] [address]   Culture, blood (routine x 2)     Status: None (Preliminary result)   Collection Time: 01/09/20  6:13 AM   Specimen: BLOOD  Result Value Ref Range Status   Specimen Description   Final    BLOOD RIGHT ARM Performed at [facility], [address], [place], [place] [address]    Special Requests   Final    BOTTLES DRAWN AEROBIC AND ANAEROBIC Blood Culture adequate volume Performed at [facility], [address] [place], [place] [address]    Culture   Final    NO GROWTH < 24 HOURS Performed at [facility], [address] [place], [place] [address]    Report Status PENDING  Incomplete  Culture, blood (routine x 2)     Status: Abnormal (Preliminary result)   Collection Time: 01/09/20  6:13 AM   Specimen: BLOOD  Result Value Ref Range Status   Specimen Description   Final    BLOOD BLOOD RIGHT HAND Performed at [facility],  [address] [place], [place] [address]    Special Requests   Final    BOTTLES DRAWN AEROBIC ONLY Blood Culture adequate volume Performed at [facility], [address], [place], [place] [address]    Culture  Setup Time   Final    AEROBIC BOTTLE ONLY GRAM POSITIVE COCCI Organism ID to follow CRITICAL RESULT CALLED TO, READ BACK BY AND VERIFIED WITH: L POINDEXTER PHARMD 01/10/20 0241 JDW Performed at [facility], [address], [place], [place] [address]    Culture (A)  Final    STAPHYLOCOCCUS EPIDERMIDIS STAPHYLOCOCCUS LUGDUNENSIS    Report Status PENDING  Incomplete  Blood Culture ID Panel (Reflexed)     Status: Abnormal   Collection Time: 01/09/20  6:13 AM  Result Value Ref Range Status   Enterococcus faecalis NOT DETECTED NOT DETECTED Final   Enterococcus Faecium NOT DETECTED NOT DETECTED Final   Listeria monocytogenes NOT DETECTED NOT DETECTED Final   Staphylococcus species DETECTED (A) NOT DETECTED Final    Comment: CRITICAL RESULT CALLED TO, READ BACK BY AND VERIFIED WITH: L POINDEXTER PHARMD 01/10/20 0241 JDW    Staphylococcus aureus (BCID) NOT DETECTED NOT DETECTED Final   Staphylococcus epidermidis DETECTED (A) NOT DETECTED Final    Comment: Methicillin (oxacillin) resistant coagulase negative staphylococcus. Possible blood culture contaminant (unless isolated from more than one blood culture draw or clinical case suggests pathogenicity). No antibiotic treatment is indicated for blood  culture contaminants. CRITICAL RESULT CALLED TO, READ BACK BY AND VERIFIED WITH: L POINDEXTER PHARMD 01/10/20 0241 JDW    Staphylococcus lugdunensis DETECTED (A) NOT DETECTED Final    Comment: Methicillin (oxacillin) resistant coagulase negative staphylococcus. Possible blood culture contaminant (unless isolated from more than one blood culture draw or clinical case suggests pathogenicity). No antibiotic treatment is indicated for blood  culture  contaminants. CRITICAL RESULT CALLED TO, READ BACK BY AND VERIFIED WITH: L POINDEXTER PHARMD 01/10/20 0241 JDW    Streptococcus species NOT DETECTED NOT DETECTED Final   Streptococcus agalactiae NOT DETECTED NOT DETECTED Final   Streptococcus pneumoniae NOT DETECTED NOT DETECTED Final   Streptococcus pyogenes NOT DETECTED NOT DETECTED Final   A.calcoaceticus-baumannii NOT DETECTED NOT DETECTED Final   Bacteroides fragilis NOT DETECTED NOT DETECTED Final   Enterobacterales NOT DETECTED NOT DETECTED Final   Enterobacter cloacae complex NOT DETECTED NOT DETECTED Final   Escherichia coli NOT DETECTED NOT DETECTED Final   Klebsiella aerogenes NOT DETECTED NOT DETECTED Final   Klebsiella oxytoca NOT DETECTED NOT DETECTED Final   Klebsiella pneumoniae NOT DETECTED NOT DETECTED Final   Proteus species NOT DETECTED NOT DETECTED Final   Salmonella species NOT DETECTED NOT DETECTED Final   Serratia marcescens NOT DETECTED NOT DETECTED Final   Haemophilus influenzae NOT DETECTED NOT DETECTED Final   Neisseria meningitidis NOT DETECTED NOT DETECTED Final   Pseudomonas aeruginosa NOT DETECTED NOT DETECTED Final   Stenotrophomonas maltophilia NOT DETECTED NOT DETECTED Final   Candida albicans NOT DETECTED NOT DETECTED Final   Candida auris NOT DETECTED NOT DETECTED Final   Candida glabrata NOT DETECTED NOT DETECTED Final   Candida krusei NOT DETECTED NOT DETECTED Final   Candida parapsilosis NOT  DETECTED NOT DETECTED Final   Candida tropicalis NOT DETECTED NOT DETECTED Final   Cryptococcus neoformans/gattii NOT DETECTED NOT DETECTED Final   Methicillin resistance mecA/C DETECTED (A) NOT DETECTED Final    Comment: CRITICAL RESULT CALLED TO, READ BACK BY AND VERIFIED WITH: L POINDEXTER PHARMD 01/10/20 0241 JDW Performed at [facility], [address] [place], [place] [address]          Radiology Studies: CT ABDOMEN PELVIS W CONTRAST  Result Date: 01/08/2020 CLINICAL DATA:  Acute  abdominal pain, unintended weight loss EXAM: CT ABDOMEN AND PELVIS WITH CONTRAST TECHNIQUE: Multidetector CT imaging of the abdomen and pelvis was performed using the standard protocol following bolus administration of intravenous contrast. CONTRAST:  OMNIPAQUE IOHEXOL 300 MG/ML  SOLN COMPARISON:  01/07/2020, 09/07/2014 FINDINGS: Lower chest: No acute pleural or parenchymal lung disease. Hepatobiliary: No focal liver abnormality is seen. No gallstones, gallbladder wall thickening, or biliary dilatation. Pancreas: Unremarkable. No pancreatic ductal dilatation or surrounding inflammatory changes. Spleen: Normal in size without focal abnormality. Adrenals/Urinary Tract: Stable nodularity of the left adrenal gland is nonspecific but likely reflects adenoma given stability. Right adrenal is grossly normal. Stable subcentimeter left renal hypodensity likely a cyst. Otherwise the kidneys enhance normally and symmetrically. No urinary tract calculi or obstructive uropathy. Foley catheter is seen within the bladder, with no filling defects. Small bladder diverticula are identified consistent with chronic bladder outlet obstruction. Stomach/Bowel: No bowel obstruction or ileus. No bowel wall thickening or inflammatory change. Large diverticulum off the third portion duodenum is unchanged. No evidence of inflammation. Small hiatal hernia is noted. Vascular/Lymphatic: There is extensive atherosclerosis of the aorta and its branches. Retroaortic left renal vein is noted, a frequent anatomic variant. No pathologic adenopathy. Reproductive: Prostate is not enlarged. Other: No free fluid or free gas. No abdominal wall hernia. Musculoskeletal: No acute or destructive bony lesions. Reconstructed images demonstrate no additional findings. IMPRESSION: 1. No acute intra-abdominal or intrapelvic process. 2. Small hiatal hernia. 3. Aortic Atherosclerosis (ICD10-I70.0). Electronically Signed   By: Sharlet Salina M.D.   On: 01/08/2020  20:55   DG Chest Port 1 View  Result Date: 01/09/2020 CLINICAL DATA:  Leukocytosis EXAM: PORTABLE CHEST 1 VIEW COMPARISON:  01/04/2020 FINDINGS: Normal heart size and mild aortic tortuosity. Widening of the upper right mediastinum is from vessels based on stability and prior imaging. Hyperinflation with diaphragm flattening. There is no edema, consolidation, effusion, or pneumothorax. IMPRESSION: COPD without acute superimposed finding. Electronically Signed   By: Marnee Spring M.D.   On: 01/09/2020 07:31        Scheduled Meds: . amLODipine  5 mg Oral Daily  . buprenorphine-naloxone  1 tablet Sublingual Daily  . Chlorhexidine Gluconate Cloth  6 each Topical Daily  . feeding supplement (KATE FARMS STANDARD 1.4)  325 mL Oral BID BM  . lidocaine  15 mL Oral Once  . mouth rinse  15 mL Mouth Rinse BID  . mometasone-formoterol  2 puff Inhalation BID  . multivitamin with minerals  1 tablet Oral Daily  . pantoprazole  40 mg Oral BID AC  . polyethylene glycol  17 g Oral BID  . potassium chloride  40 mEq Oral Daily  . senna-docusate  1 tablet Oral BID  . sucralfate  1 g Oral QID  . tamsulosin  0.4 mg Oral Daily   Continuous Infusions: . lactated ringers Stopped (01/10/20 0945)  . vancomycin 1,000 mg (01/10/20 1532)     LOS: 6 days  Briant Cedar, MD 01/10/2020, 4:52 PM

## 2020-01-10 NOTE — Anesthesia Postprocedure Evaluation (Signed)
Anesthesia Post Note  Patient: [name]  Procedure(s) Performed: ESOPHAGOGASTRODUODENOSCOPY (EGD) WITH PROPOFOL (N/A )     Patient location during evaluation: PACU Anesthesia Type: MAC Level of consciousness: awake and alert Pain management: pain level controlled Vital Signs Assessment: post-procedure vital signs reviewed and stable Respiratory status: spontaneous breathing, nonlabored ventilation, respiratory function stable and patient connected to nasal cannula oxygen Cardiovascular status: stable and blood pressure returned to baseline Postop Assessment: no apparent nausea or vomiting Anesthetic complications: no   No complications documented.  Last Vitals:  Vitals:   01/10/20 0930 01/10/20 0940  BP: (!) 167/81 (!) 147/86  Pulse:    Resp: (!) 23 18  Temp:    SpO2:      Last Pain:  Vitals:   01/10/20 0940  TempSrc:   PainSc: 0-No pain                 Merlinda Frederick

## 2020-01-10 NOTE — Op Note (Signed)
[facility] Patient Name: [name] Procedure Date: 01/10/2020 MRN: [id number] Attending MD: Rachael Fee , MD Date of Birth: 12/04/1945 CSN: [id number] Age: 74 Admit Type: Inpatient Procedure:                Upper GI endoscopy Indications:              concern for recurrent bleeding since EGD 5 days ago                            by GM that showed severe esophagitis and peptic                            duodenitis Providers:                Rachael Fee, MD, Bonney Leitz, Arlee Muslim Tech., Technician, Brion Aliment,                            Technician, Greig Right, CRNA Referring MD:              Medicines:                Monitored Anesthesia Care Complications:            No immediate complications. Estimated blood loss:                            None. Estimated Blood Loss:     Estimated blood loss: none. Procedure:                Pre-Anesthesia Assessment:                           - Prior to the procedure, a History and Physical                            was performed, and patient medications and                            allergies were reviewed. The patient's tolerance of                            previous anesthesia was also reviewed. The risks                            and benefits of the procedure and the sedation                            options and risks were discussed with the patient.                            All questions were answered, and informed consent                            was obtained.  Prior Anticoagulants: The patient has                            taken no previous anticoagulant or antiplatelet                            agents. ASA Grade Assessment: III - A patient with                            severe systemic disease. After reviewing the risks                            and benefits, the patient was deemed in                            satisfactory condition to undergo the  procedure.                           After obtaining informed consent, the endoscope was                            passed under direct vision. Throughout the                            procedure, the patient's blood pressure, pulse, and                            oxygen saturations were monitored continuously. The                            GIF-H190 ([id number]) Olympus gastroscope was                            introduced through the mouth, and advanced to the                            second part of duodenum. The upper GI endoscopy was                            accomplished without difficulty. The patient                            tolerated the procedure well. Scope In: Scope Out: Findings:      The previously noted severe, ulcerative esophagitis is somewhat improved       endoscopically however still LA Grade D ulcerative reflux related       esophagitis with proximal extent of the circumferential damage in the       proximal to mid esophagitis.      Small hiatal hernia.      Mild, non-specific distal gastritis. Not biopsied again.      Several shallow, clearly healing ulcerations in the duodenum (post       bulbar). These regions are friable to touch.      No recent or old blood in the UGI tract.  The exam was otherwise without abnormality. Impression:               - His severe acid related esophagitis is still                            present, probably improved a bit however.                           - The duodenal ulcers are clearly healing.                           He tolerated solid food last night without                            vomiting. Last BM was yesterday, mid afternoon and                            was brown. Moderate Sedation:      Not Applicable - Patient had care per Anesthesia. Recommendation:           - Return patient to hospital ward for ongoing care.                           - I am going to change him to oral PPI. He should                             remain on BID PPI at least for the next 2 months.                           - [name] will reach out to him for return                            office visit in 6-7 weeks to see how he is doing.                           - Please call or page with any further questions or                            concerns. From GI standpoint he is safe for d/c                            later today. Procedure Code(s):        --- Professional ---                           [phone], Esophagogastroduodenoscopy, flexible,                            transoral; diagnostic, including collection of                            specimen(s) by brushing or washing, when performed                            (  separate procedure) Diagnosis Code(s):        --- Professional ---                           K21.00, Gastro-esophageal reflux disease with                            esophagitis, without bleeding                           K92.1, Melena (includes Hematochezia) CPT copyright 2019 American Medical Association. All rights reserved. The codes documented in this report are preliminary and upon coder review may  be revised to meet current compliance requirements. Rachael Fee, MD 01/10/2020 12:29:59 PM This report has been signed electronically. Number of Addenda: 0

## 2020-01-11 LAB — CBC WITH DIFFERENTIAL/PLATELET
Abs Immature Granulocytes: 0.13 10*3/uL — ABNORMAL HIGH (ref 0.00–0.07)
Basophils Absolute: 0 10*3/uL (ref 0.0–0.1)
Basophils Relative: 0 %
Eosinophils Absolute: 0.2 10*3/uL (ref 0.0–0.5)
Eosinophils Relative: 1 %
HCT: 28.1 % — ABNORMAL LOW (ref 39.0–52.0)
Hemoglobin: 8.6 g/dL — ABNORMAL LOW (ref 13.0–17.0)
Immature Granulocytes: 1 %
Lymphocytes Relative: 8 %
Lymphs Abs: 1.3 10*3/uL (ref 0.7–4.0)
MCH: 24.6 pg — ABNORMAL LOW (ref 26.0–34.0)
MCHC: 30.6 g/dL (ref 30.0–36.0)
MCV: 80.5 fL (ref 80.0–100.0)
Monocytes Absolute: 0.9 10*3/uL (ref 0.1–1.0)
Monocytes Relative: 5 %
Neutro Abs: 14.5 10*3/uL — ABNORMAL HIGH (ref 1.7–7.7)
Neutrophils Relative %: 85 %
Platelets: 447 10*3/uL — ABNORMAL HIGH (ref 150–400)
RBC: 3.49 MIL/uL — ABNORMAL LOW (ref 4.22–5.81)
RDW: 20 % — ABNORMAL HIGH (ref 11.5–15.5)
WBC: 17.1 10*3/uL — ABNORMAL HIGH (ref 4.0–10.5)
nRBC: 0 % (ref 0.0–0.2)

## 2020-01-11 LAB — BASIC METABOLIC PANEL
Anion gap: 11 (ref 5–15)
BUN: 16 mg/dL (ref 8–23)
CO2: 21 mmol/L — ABNORMAL LOW (ref 22–32)
Calcium: 8.1 mg/dL — ABNORMAL LOW (ref 8.9–10.3)
Chloride: 110 mmol/L (ref 98–111)
Creatinine, Ser: 0.71 mg/dL (ref 0.61–1.24)
GFR calc Af Amer: >60 mL/min (ref >60)
GFR calc non Af Amer: >60 mL/min (ref >60)
Glucose, Bld: 107 mg/dL — ABNORMAL HIGH (ref 70–99)
Potassium: 3.6 mmol/L (ref 3.5–5.1)
Sodium: 142 mmol/L (ref 135–145)

## 2020-01-11 LAB — PHOSPHORUS: Phosphorus: 2.5 mg/dL (ref 2.5–4.6)

## 2020-01-11 LAB — MAGNESIUM: Magnesium: 1.5 mg/dL — ABNORMAL LOW (ref 1.7–2.4)

## 2020-01-11 MED ORDER — MAGNESIUM SULFATE 2 GM/50ML IV SOLN
2.0000 g | Freq: Once | INTRAVENOUS | Status: AC
  Administered 2020-01-11: 2 g via INTRAVENOUS
  Filled 2020-01-11: qty 50

## 2020-01-11 MED ORDER — ENOXAPARIN SODIUM 40 MG/0.4ML ~~LOC~~ SOLN
40.0000 mg | SUBCUTANEOUS | Status: DC
  Administered 2020-01-11 – 2020-01-17 (×7): 40 mg via SUBCUTANEOUS
  Filled 2020-01-11 (×7): qty 0.4

## 2020-01-11 NOTE — Progress Notes (Signed)
Received patient from ICU, VS obtained, telemetry monitor applied, oriented to unit, call light placed in reach  

## 2020-01-11 NOTE — Progress Notes (Signed)
Physical Therapy Treatment Patient Details Name: [name] MRN: [id number] DOB: 10-23-1945 Today's Date: 01/11/2020    History of Present Illness [name] is a 74 y.o. male with medical history significant of peptic ulcer disease admitted with nausea vomiting and black stools. Upper GI bleed EGD shows esophagitis with bleeding which was biopsied.  Hiatal hernia and gastritis.  Nonbleeding duodenal ulcer    PT Comments    Pt in much better spirits and eager to participate.  Pt up to ambulate in hall but requiring increased time and standing rest breaks 2* elevating HR (140 max) to complete task.   Follow Up Recommendations  SNF;Supervision/Assistance - 24 hour     Equipment Recommendations  None recommended by PT    Recommendations for Other Services       Precautions / Restrictions Precautions Precautions: Fall Restrictions Weight Bearing Restrictions: No    Mobility  Bed Mobility Overal bed mobility: Needs Assistance Bed Mobility: Supine to Sit     Supine to sit: Min guard     General bed mobility comments: Increased time but no physical assist  Transfers Overall transfer level: Needs assistance Equipment used: Rolling walker (2 wheeled) Transfers: Sit to/from Stand Sit to Stand: Min assist         General transfer comment: cues for use of UEs to self assist and min assist to bring wt up and fwd and to balance in standing  Ambulation/Gait Ambulation/Gait assistance: Min assist;+2 safety/equipment Gait Distance (Feet): 200 Feet Assistive device: Rolling walker (2 wheeled) Gait Pattern/deviations: Step-through pattern;Shuffle     General Gait Details: cues for posture and position from RW.  INcreased time with multiple standing rest breaks 2* elevating HR   Stairs             Wheelchair Mobility    Modified Rankin (Stroke Patients Only)       Balance Overall balance assessment: Needs assistance Sitting-balance support: Feet  supported Sitting balance-Leahy Scale: Fair     Standing balance support: During functional activity;Bilateral upper extremity supported Standing balance-Leahy Scale: Poor                              Cognition Arousal/Alertness: Awake/alert Behavior During Therapy: WFL for tasks assessed/performed Overall Cognitive Status: Within Functional Limits for tasks assessed                                 General Comments: Pt in much better spirits and following commands and answering questions appropriately      Exercises      General Comments        Pertinent Vitals/Pain Pain Assessment: Faces Faces Pain Scale: No hurt    Home Living                      Prior Function            PT Goals (current goals can now be found in the care plan section) Acute Rehab PT Goals Patient Stated Goal: eager to get up and move PT Goal Formulation: With patient Time For Goal Achievement: 01/22/20 Potential to Achieve Goals: Fair Progress towards PT goals: Progressing toward goals    Frequency    Min 3X/week      PT Plan Current plan remains appropriate    Co-evaluation  AM-PAC PT "6 Clicks" Mobility   Outcome Measure  Help needed turning from your back to your side while in a flat bed without using bedrails?: A Little Help needed moving from lying on your back to sitting on the side of a flat bed without using bedrails?: A Little Help needed moving to and from a bed to a chair (including a wheelchair)?: A Little Help needed standing up from a chair using your arms (e.g., wheelchair or bedside chair)?: A Little Help needed to walk in hospital room?: A Little Help needed climbing 3-5 steps with a railing? : A Little 6 Click Score: 18    End of Session Equipment Utilized During Treatment: Gait belt Activity Tolerance: Patient tolerated treatment well Patient left: in chair;with call bell/phone within reach;with chair  alarm set Nurse Communication: Mobility status PT Visit Diagnosis: Unsteadiness on feet (R26.81);Difficulty in walking, not elsewhere classified (R26.2)     Time: 7588-3254 PT Time Calculation (min) (ACUTE ONLY): 23 min  Charges:  $Gait Training: 23-37 mins                     [name] PT Acute Rehabilitation Services Pager [phone] Office [phone]    [name] 01/11/2020, 4:13 PM

## 2020-01-11 NOTE — Progress Notes (Signed)
PROGRESS NOTE    [name]  NIO:[id number] DOB: 18-Sep-1945 DOA: 01/04/2020 PCP: [name], MD   Brief Narrative:  [name] is a 74 y.o. male with medical history significant of peptic ulcer disease admitted with abdominal pain, nausea, vomiting and black stools. Pt reports he has not been able to eat or drink anything for the last 4 days not been able to keep it down as he was vomiting.  Significant weight loss.  Pt takes Goody powder and aspirin and multiple antihypertensives at home. He is also on Suboxone at home. ED Course: His potassium was 2.5 sodium was 135 BUN 87 creatinine 6.47 anion gap was 26, white count 22.2, hemoglobin 7.8 platelets 496 INR 1.3, Blood pressure 95/56 came up to 105/ 54 heart rate 90 respiration 19 sats 99% on 2 L temperature 98.7. He was seen by PCCM in the ER and asked hospitalist to admit the patient.  Assessment & Plan:   Active Problems:   Gastrointestinal hemorrhage   Hypokalemia   Leukocytosis   Acute esophagitis   Malnutrition of moderate degree   Upper GI bleed likely 2/2 bleeding duodenal ulcer  GI on board, s/p EGD showed esophagitis with bleeding which was biopsied.  Hiatal hernia and gastritis.  Nonbleeding duodenal ulcer with a clean ulcer base Repeat EGD on 01/10/20 showed severe acid related esophagitis which is still present, duodenal ulcers are clearly healing CT abdomen/pelvis unremarkable Continue PPI, carafate   Acute blood loss anemia Likely 2/2 above Hemoglobin dropped to 6.7 on 8/17, transfused 2 units of PRBC Daily CBC  Hypokalemia/hypomagnesemia Replace as needed Daily BMP, magnesium  Sepsis likely 2/2 bacteremia 2/2 staph epidermidis/lugdunensis Temp spike on 01/09/20 was 100.54F, with leukocytosis BC X 2 grew staph epidermidis/lugdunensis  Chest x-ray unremarkable UA with large leukocytes, rare bacteria, greater than 50 WBC Procalcitonin negative CT abdomen/pelvis unremarkable Continue  Vancomycin  Daily CBC  Resolved AKI with acidosis/hypernatremia Likely 2/2 poor oral intake Daily BMP  Hypertension Continue Norvasc  Urinary retention with history of BPH  Monitor closely  History of polysubstance abuse Urine drug screen positive for cocaine Continue Suboxone      Estimated body mass index is 21.02 kg/m as calculated from the following:   Height as of this encounter: 6' (1.829 m).   Weight as of this encounter: 70.3 kg.  DVT prophylaxis: Lovenox Code Status: Full code  family Communication: None at bedside Disposition Plan:  Status is: Inpatient  Dispo: The patient is from: Home              Anticipated d/c is to: Home              Anticipated d/c date is more than 2 days              Patient currently is not medically stable to d/c.     Consultants:   GI  Procedures: EGD 01/05/2020 and 01/10/20  Antimicrobials: None  Subjective: Patient denies any new complaints   objective: Vitals:   01/11/20 0816 01/11/20 0852 01/11/20 1220 01/11/20 1415  BP:   124/83   Pulse:   (!) 110   Resp:   20   Temp: 98.3 F (36.8 C)   97.9 F (36.6 C)  TempSrc: Oral   Oral  SpO2:  98% 99%   Weight:      Height:        Intake/Output Summary (Last 24 hours) at 01/11/2020 1448 Last data filed at 01/11/2020 1220 Gross per 24  hour  Intake 427 ml  Output 300 ml  Net 127 ml   Filed Weights   01/06/20 1310 01/10/20 0306  Weight: 72.8 kg 70.3 kg    Examination:  General:  Chronically ill-appearing, noted to be intermittently teary  Cardiovascular: S1, S2 present  Respiratory: CTAB  Abdomen: Soft, non-tender, nondistended, bowel sounds present  Musculoskeletal: No bilateral pedal edema noted  Skin: Normal  Psychiatry:  Fair mood     Data Reviewed: I have personally reviewed following labs and imaging studies  CBC: Recent Labs  Lab 01/06/20 0308 01/06/20 0308 01/07/20 0728 01/07/20 0728 01/07/20 2212 01/09/20 6270 01/09/20 1249  01/10/20 0222 01/11/20 0229  WBC 12.9*  --  16.0*  --   --  17.5*  --  13.6* 17.1*  NEUTROABS  --   --   --   --   --  14.6*  --  11.0* 14.5*  HGB 7.6*   < > 6.8*   < > 9.8* 9.4* 8.9* 8.4* 8.6*  HCT 25.2*   < > 22.6*   < > 31.2* 31.0* 28.4* 27.4* 28.1*  MCV 80.0  --  82.5  --   --  82.9  --  80.8 80.5  PLT 341  --  323  --   --  325  --  178 447*   < > = values in this interval not displayed.   Basic Metabolic Panel: Recent Labs  Lab 01/06/20 0308 01/07/20 0241 01/07/20 3500 01/08/20 0237 01/09/20 9381 01/10/20 0222 01/11/20 0229 01/11/20 0743  NA   < >  --  149* 149* 141 140 142  --   K   < >  --  3.0* 3.4* 3.2* 3.4* 3.6  --   CL   < >  --  120* 118* 110 112* 110  --   CO2   < >  --  19* 18* 21* 19* 21*  --   GLUCOSE   < >  --  152* 115* 116* 102* 107*  --   BUN   < >  --  31* 16 15 11 16   --   CREATININE   < >  --  0.97 0.82 0.74 0.67 0.71  --   CALCIUM   < >  --  8.0* 7.9* 7.7* 7.7* 8.1*  --   MG  --  1.7  --  1.2* 1.5* 1.4*  --  1.5*  PHOS  --  2.1*  --  2.3* 2.6 2.7  --  2.5   < > = values in this interval not displayed.   GFR: Estimated Creatinine Clearance: 84.2 mL/min (by C-G formula based on SCr of 0.71 mg/dL). Liver Function Tests: Recent Labs  Lab 01/05/20 0237 01/06/20 0308  AST 16 15  ALT 13 13  ALKPHOS 50 49  BILITOT 0.6 0.8  PROT 6.3* 6.5  ALBUMIN 2.4* 2.5*   No results for input(s): LIPASE, AMYLASE in the last 168 hours. No results for input(s): AMMONIA in the last 168 hours. Coagulation Profile: No results for input(s): INR, PROTIME in the last 168 hours. Cardiac Enzymes: No results for input(s): CKTOTAL, CKMB, CKMBINDEX, TROPONINI in the last 168 hours. BNP (last 3 results) No results for input(s): PROBNP in the last 8760 hours. HbA1C: No results for input(s): HGBA1C in the last 72 hours. CBG: No results for input(s): GLUCAP in the last 168 hours. Lipid Profile: No results for input(s): CHOL, HDL, LDLCALC, TRIG, CHOLHDL, LDLDIRECT in the  last 72 hours.  Thyroid Function Tests: No results for input(s): TSH, T4TOTAL, FREET4, T3FREE, THYROIDAB in the last 72 hours. Anemia Panel: No results for input(s): VITAMINB12, FOLATE, FERRITIN, TIBC, IRON, RETICCTPCT in the last 72 hours. Sepsis Labs: Recent Labs  Lab 01/05/20 0237 01/06/20 0308 01/09/20 0752  PROCALCITON 2.68 0.91 <0.10    Recent Results (from the past 240 hour(s))  SARS Coronavirus 2 by RT PCR (hospital order, performed in [facility] lab) Nasopharyngeal Nasopharyngeal Swab     Status: None   Collection Time: 01/04/20 12:58 PM   Specimen: Nasopharyngeal Swab  Result Value Ref Range Status   SARS Coronavirus 2 NEGATIVE NEGATIVE Final    Comment: (NOTE) SARS-CoV-2 target nucleic acids are NOT DETECTED.  The SARS-CoV-2 RNA is generally detectable in upper and lower respiratory specimens during the acute phase of infection. The lowest concentration of SARS-CoV-2 viral copies this assay can detect is 250 copies / mL. A negative result does not preclude SARS-CoV-2 infection and should not be used as the sole basis for treatment or other patient management decisions.  A negative result may occur with improper specimen collection / handling, submission of specimen other than nasopharyngeal swab, presence of viral mutation(s) within the areas targeted by this assay, and inadequate number of viral copies (<250 copies / mL). A negative result must be combined with clinical observations, patient history, and epidemiological information.  Fact Sheet for Patients:   BoilerBrush.com.cyhttps://www.fda.gov/media/136312/download  Fact Sheet for Healthcare Providers: https://pope.com/https://www.fda.gov/media/136313/download  This test is not yet approved or  cleared by the [place] FDA and has been authorized for detection and/or diagnosis of SARS-CoV-2 by FDA under an Emergency Use Authorization (EUA).  This EUA will remain in effect (meaning this test can be used) for the duration of  the COVID-19 declaration under Section 564(b)(1) of the Act, 21 U.S.C. section 360bbb-3(b)(1), unless the authorization is terminated or revoked sooner.  Performed at [facility], [address] [place], [place] [address]   MRSA PCR Screening     Status: Abnormal   Collection Time: 01/04/20 11:28 PM   Specimen: Nasopharyngeal  Result Value Ref Range Status   MRSA by PCR POSITIVE (A) NEGATIVE Final    Comment:        The GeneXpert MRSA Assay (FDA approved for NASAL specimens only), is one component of a comprehensive MRSA colonization surveillance program. It is not intended to diagnose MRSA infection nor to guide or monitor treatment for MRSA infections. RESULT CALLED TO, READ BACK BY AND VERIFIED WITH: A,ROTHERMEL AT 0118 ON 01/05/20 BY A,MOHAMED Performed at [facility], [address] [place], [place] [address]   Culture, blood (routine x 2)     Status: None (Preliminary result)   Collection Time: 01/09/20  6:13 AM   Specimen: BLOOD  Result Value Ref Range Status   Specimen Description   Final    BLOOD RIGHT ARM Performed at [facility], [address] [place], [place] [address]    Special Requests   Final    BOTTLES DRAWN AEROBIC AND ANAEROBIC Blood Culture adequate volume Performed at [facility], [address] [place], [place] [address]    Culture   Final    NO GROWTH < 24 HOURS Performed at [facility], [address] [place], [place] [address]    Report Status PENDING  Incomplete  Culture, blood (routine x 2)     Status: Abnormal (Preliminary result)   Collection Time: 01/09/20  6:13  AM   Specimen: BLOOD  Result Value Ref Range Status   Specimen Description   Final    BLOOD BLOOD RIGHT HAND Performed at [facility], [address], [place], [place] [address]    Special Requests   Final    BOTTLES DRAWN AEROBIC ONLY Blood Culture adequate  volume Performed at [facility], [address] [place], [place] [address]    Culture  Setup Time   Final    AEROBIC BOTTLE ONLY GRAM POSITIVE COCCI Organism ID to follow CRITICAL RESULT CALLED TO, READ BACK BY AND VERIFIED WITH: L POINDEXTER PHARMD 01/10/20 0241 JDW    Culture (A)  Final    STAPHYLOCOCCUS EPIDERMIDIS STAPHYLOCOCCUS LUGDUNENSIS THE SIGNIFICANCE OF ISOLATING THIS ORGANISM FROM A SINGLE SET OF BLOOD CULTURES WHEN MULTIPLE SETS ARE DRAWN IS UNCERTAIN. PLEASE NOTIFY THE MICROBIOLOGY DEPARTMENT WITHIN ONE WEEK IF SPECIATION AND SENSITIVITIES ARE REQUIRED. Performed at [facility], [address], [place], [place] [address]    Report Status PENDING  Incomplete  Blood Culture ID Panel (Reflexed)     Status: Abnormal   Collection Time: 01/09/20  6:13 AM  Result Value Ref Range Status   Enterococcus faecalis NOT DETECTED NOT DETECTED Final   Enterococcus Faecium NOT DETECTED NOT DETECTED Final   Listeria monocytogenes NOT DETECTED NOT DETECTED Final   Staphylococcus species DETECTED (A) NOT DETECTED Final    Comment: CRITICAL RESULT CALLED TO, READ BACK BY AND VERIFIED WITH: L POINDEXTER PHARMD 01/10/20 0241 JDW    Staphylococcus aureus (BCID) NOT DETECTED NOT DETECTED Final   Staphylococcus epidermidis DETECTED (A) NOT DETECTED Final    Comment: Methicillin (oxacillin) resistant coagulase negative staphylococcus. Possible blood culture contaminant (unless isolated from more than one blood culture draw or clinical case suggests pathogenicity). No antibiotic treatment is indicated for blood  culture contaminants. CRITICAL RESULT CALLED TO, READ BACK BY AND VERIFIED WITH: L POINDEXTER PHARMD 01/10/20 0241 JDW    Staphylococcus lugdunensis DETECTED (A) NOT DETECTED Final    Comment: Methicillin (oxacillin) resistant coagulase negative staphylococcus. Possible blood culture contaminant (unless isolated from more than one blood culture draw or clinical  case suggests pathogenicity). No antibiotic treatment is indicated for blood  culture contaminants. CRITICAL RESULT CALLED TO, READ BACK BY AND VERIFIED WITH: L POINDEXTER PHARMD 01/10/20 0241 JDW    Streptococcus species NOT DETECTED NOT DETECTED Final   Streptococcus agalactiae NOT DETECTED NOT DETECTED Final   Streptococcus pneumoniae NOT DETECTED NOT DETECTED Final   Streptococcus pyogenes NOT DETECTED NOT DETECTED Final   A.calcoaceticus-baumannii NOT DETECTED NOT DETECTED Final   Bacteroides fragilis NOT DETECTED NOT DETECTED Final   Enterobacterales NOT DETECTED NOT DETECTED Final   Enterobacter cloacae complex NOT DETECTED NOT DETECTED Final   Escherichia coli NOT DETECTED NOT DETECTED Final   Klebsiella aerogenes NOT DETECTED NOT DETECTED Final   Klebsiella oxytoca NOT DETECTED NOT DETECTED Final   Klebsiella pneumoniae NOT DETECTED NOT DETECTED Final   Proteus species NOT DETECTED NOT DETECTED Final   Salmonella species NOT DETECTED NOT DETECTED Final   Serratia marcescens NOT DETECTED NOT DETECTED Final   Haemophilus influenzae NOT DETECTED NOT DETECTED Final   Neisseria meningitidis NOT DETECTED NOT DETECTED Final   Pseudomonas aeruginosa NOT DETECTED NOT DETECTED Final   Stenotrophomonas maltophilia NOT DETECTED NOT DETECTED Final   Candida albicans NOT DETECTED NOT DETECTED Final   Candida auris NOT DETECTED NOT DETECTED Final   Candida glabrata NOT DETECTED NOT DETECTED Final  Candida krusei NOT DETECTED NOT DETECTED Final   Candida parapsilosis NOT DETECTED NOT DETECTED Final   Candida tropicalis NOT DETECTED NOT DETECTED Final   Cryptococcus neoformans/gattii NOT DETECTED NOT DETECTED Final   Methicillin resistance mecA/C DETECTED (A) NOT DETECTED Final    Comment: CRITICAL RESULT CALLED TO, READ BACK BY AND VERIFIED WITH: L POINDEXTER PHARMD 01/10/20 0241 JDW Performed at [facility], [address], [place], [place] [address]          Radiology  Studies: No results found.      Scheduled Meds: . amLODipine  5 mg Oral Daily  . buprenorphine-naloxone  1 tablet Sublingual Daily  . Chlorhexidine Gluconate Cloth  6 each Topical Daily  . feeding supplement ([employer] STANDARD 1.4)  325 mL Oral BID BM  . lidocaine  15 mL Oral Once  . mouth rinse  15 mL Mouth Rinse BID  . mometasone-formoterol  2 puff Inhalation BID  . multivitamin with minerals  1 tablet Oral Daily  . pantoprazole  40 mg Oral BID AC  . polyethylene glycol  17 g Oral BID  . potassium chloride  40 mEq Oral Daily  . senna-docusate  1 tablet Oral BID  . sucralfate  1 g Oral QID  . tamsulosin  0.4 mg Oral Daily   Continuous Infusions: . lactated ringers Stopped (01/10/20 0945)  . magnesium sulfate bolus IVPB    . vancomycin Stopped (01/11/20 0600)     LOS: 7 days     Briant Cedar, MD 01/11/2020, 2:48 PM

## 2020-01-12 ENCOUNTER — Encounter: Payer: Self-pay | Admitting: Gastroenterology

## 2020-01-12 LAB — CBC WITH DIFFERENTIAL/PLATELET
Abs Immature Granulocytes: 0.08 10*3/uL — ABNORMAL HIGH (ref 0.00–0.07)
Basophils Absolute: 0 10*3/uL (ref 0.0–0.1)
Basophils Relative: 0 %
Eosinophils Absolute: 0.2 10*3/uL (ref 0.0–0.5)
Eosinophils Relative: 1 %
HCT: 27.5 % — ABNORMAL LOW (ref 39.0–52.0)
Hemoglobin: 8.2 g/dL — ABNORMAL LOW (ref 13.0–17.0)
Immature Granulocytes: 1 %
Lymphocytes Relative: 9 %
Lymphs Abs: 1.2 10*3/uL (ref 0.7–4.0)
MCH: 25.7 pg — ABNORMAL LOW (ref 26.0–34.0)
MCHC: 29.8 g/dL — ABNORMAL LOW (ref 30.0–36.0)
MCV: 86.2 fL (ref 80.0–100.0)
Monocytes Absolute: 0.8 10*3/uL (ref 0.1–1.0)
Monocytes Relative: 6 %
Neutro Abs: 10.9 10*3/uL — ABNORMAL HIGH (ref 1.7–7.7)
Neutrophils Relative %: 83 %
Platelets: 461 10*3/uL — ABNORMAL HIGH (ref 150–400)
RBC: 3.19 MIL/uL — ABNORMAL LOW (ref 4.22–5.81)
RDW: 20.3 % — ABNORMAL HIGH (ref 11.5–15.5)
WBC: 13.2 10*3/uL — ABNORMAL HIGH (ref 4.0–10.5)
nRBC: 0 % (ref 0.0–0.2)

## 2020-01-12 LAB — CULTURE, BLOOD (ROUTINE X 2): Special Requests: ADEQUATE

## 2020-01-12 LAB — BASIC METABOLIC PANEL
Anion gap: 9 (ref 5–15)
BUN: 13 mg/dL (ref 8–23)
CO2: 21 mmol/L — ABNORMAL LOW (ref 22–32)
Calcium: 8.4 mg/dL — ABNORMAL LOW (ref 8.9–10.3)
Chloride: 110 mmol/L (ref 98–111)
Creatinine, Ser: 0.73 mg/dL (ref 0.61–1.24)
GFR calc Af Amer: >60 mL/min (ref >60)
GFR calc non Af Amer: >60 mL/min (ref >60)
Glucose, Bld: 94 mg/dL (ref 70–99)
Potassium: 4 mmol/L (ref 3.5–5.1)
Sodium: 140 mmol/L (ref 135–145)

## 2020-01-12 LAB — HIV ANTIBODY (ROUTINE TESTING W REFLEX): HIV Screen 4th Generation wRfx: NONREACTIVE

## 2020-01-12 LAB — MAGNESIUM: Magnesium: 1.7 mg/dL (ref 1.7–2.4)

## 2020-01-12 NOTE — Plan of Care (Signed)

## 2020-01-12 NOTE — Progress Notes (Signed)
PROGRESS NOTE    [name]  LOV:[id number] DOB: 10/07/1945 DOA: 01/04/2020 PCP: [name], MD   Brief Narrative:  [name] is a 74 y.o. male with medical history significant of peptic ulcer disease admitted with abdominal pain, nausea, vomiting and black stools. Pt reports he has not been able to eat or drink anything for the last 4 days not been able to keep it down as he was vomiting.  Significant weight loss.  Pt takes Goody powder and aspirin and multiple antihypertensives at home. He is also on Suboxone at home. ED Course: His potassium was 2.5 sodium was 135 BUN 87 creatinine 6.47 anion gap was 26, white count 22.2, hemoglobin 7.8 platelets 496 INR 1.3, Blood pressure 95/56 came up to 105/ 54 heart rate 90 respiration 19 sats 99% on 2 L temperature 98.7. He was seen by PCCM in the ER and asked hospitalist to admit the patient.  Assessment & Plan:   Active Problems:   Gastrointestinal hemorrhage   Hypokalemia   Leukocytosis   Acute esophagitis   Malnutrition of moderate degree   Upper GI bleed likely 2/2 bleeding duodenal ulcer  GI on board, s/p EGD showed esophagitis with bleeding which was biopsied.  Hiatal hernia and gastritis.  Nonbleeding duodenal ulcer with a clean ulcer base Repeat EGD on 01/10/20 showed severe acid related esophagitis which is still present, duodenal ulcers are clearly healing CT abdomen/pelvis unremarkable Continue PPI, carafate   Acute blood loss anemia Likely 2/2 above Hemoglobin dropped to 6.7 on 8/17, transfused 2 units of PRBC Daily CBC  Hypokalemia/hypomagnesemia Replace as needed Daily BMP, magnesium  ??Sepsis likely 2/2 bacteremia 2/2 staph epidermidis/lugdunensis Temp spike on 01/09/20 was 100.19F, with leukocytosis BC X 2 grew staph epidermidis/lugdunensis, repeat pending Chest x-ray unremarkable UA with large leukocytes, rare bacteria, greater than 50 WBC Procalcitonin negative CT abdomen/pelvis  unremarkable Continue Vancomycin  Daily CBC  Resolved AKI with acidosis/hypernatremia Likely 2/2 poor oral intake Daily BMP  Hypertension Continue Norvasc  Urinary retention with history of BPH  Monitor closely  History of polysubstance abuse Urine drug screen positive for cocaine Continue Suboxone      Estimated body mass index is 21.02 kg/m as calculated from the following:   Height as of this encounter: 6' (1.829 m).   Weight as of this encounter: 70.3 kg.  DVT prophylaxis: Lovenox Code Status: Full code  family Communication: None at bedside Disposition Plan:  Status is: Inpatient  Dispo: The patient is from: Home              Anticipated d/c is to: Home              Anticipated d/c date is more than 2 days              Patient currently is not medically stable to d/c.     Consultants:   GI  Procedures: EGD 01/05/2020 and 01/10/20  Antimicrobials: None  Subjective: Patient denies any new complaints.   objective: Vitals:   01/11/20 2030 01/12/20 0204 01/12/20 0516 01/12/20 0858  BP: 135/79 116/85 122/80   Pulse: 99 95 89   Resp: 20  14   Temp: 98.3 F (36.8 C) 98.8 F (37.1 C) 98.9 F (37.2 C)   TempSrc: Oral Oral Oral   SpO2: 96% 99% 98% 96%  Weight:      Height:        Intake/Output Summary (Last 24 hours) at 01/12/2020 1115 Last data filed at 01/12/2020 0500  Gross per 24 hour  Intake 746.32 ml  Output 700 ml  Net 46.32 ml   Filed Weights   01/06/20 1310 01/10/20 0306  Weight: 72.8 kg 70.3 kg    Examination:  General: NAD  Cardiovascular: S1, S2 present  Respiratory: CTAB  Abdomen: Soft, non-tender, nondistended, bowel sounds present  Musculoskeletal: No bilateral pedal edema noted  Skin: Normal  Psychiatry:  Normal mood     Data Reviewed: I have personally reviewed following labs and imaging studies  CBC: Recent Labs  Lab 01/07/20 0728 01/07/20 2212 01/09/20 0613 01/09/20 1249 01/10/20 0222 01/11/20 0229  01/12/20 0526  WBC 16.0*  --  17.5*  --  13.6* 17.1* 13.2*  NEUTROABS  --   --  14.6*  --  11.0* 14.5* 10.9*  HGB 6.8*   < > 9.4* 8.9* 8.4* 8.6* 8.2*  HCT 22.6*   < > 31.0* 28.4* 27.4* 28.1* 27.5*  MCV 82.5  --  82.9  --  80.8 80.5 86.2  PLT 323  --  325  --  178 447* 461*   < > = values in this interval not displayed.   Basic Metabolic Panel: Recent Labs  Lab 01/07/20 0241 01/07/20 0728 01/08/20 0237 01/09/20 4098 01/10/20 0222 01/11/20 0229 01/11/20 0743 01/12/20 0526  NA  --    < > 149* 141 140 142  --  140  K  --    < > 3.4* 3.2* 3.4* 3.6  --  4.0  CL  --    < > 118* 110 112* 110  --  110  CO2  --    < > 18* 21* 19* 21*  --  21*  GLUCOSE  --    < > 115* 116* 102* 107*  --  94  BUN  --    < > --  13  CREATININE  --    < > 0.82 0.74 0.67 0.71  --  0.73  CALCIUM  --    < > 7.9* 7.7* 7.7* 8.1*  --  8.4*  MG 1.7  --  1.2* 1.5* 1.4*  --  1.5* 1.7  PHOS 2.1*  --  2.3* 2.6 2.7  --  2.5  --    < > = values in this interval not displayed.   GFR: Estimated Creatinine Clearance: 84.2 mL/min (by C-G formula based on SCr of 0.73 mg/dL). Liver Function Tests: Recent Labs  Lab 01/06/20 0308  AST 15  ALT 13  ALKPHOS 49  BILITOT 0.8  PROT 6.5  ALBUMIN 2.5*   No results for input(s): LIPASE, AMYLASE in the last 168 hours. No results for input(s): AMMONIA in the last 168 hours. Coagulation Profile: No results for input(s): INR, PROTIME in the last 168 hours. Cardiac Enzymes: No results for input(s): CKTOTAL, CKMB, CKMBINDEX, TROPONINI in the last 168 hours. BNP (last 3 results) No results for input(s): PROBNP in the last 8760 hours. HbA1C: No results for input(s): HGBA1C in the last 72 hours. CBG: No results for input(s): GLUCAP in the last 168 hours. Lipid Profile: No results for input(s): CHOL, HDL, LDLCALC, TRIG, CHOLHDL, LDLDIRECT in the last 72 hours. Thyroid Function Tests: No results for input(s): TSH, T4TOTAL, FREET4, T3FREE, THYROIDAB in the last 72  hours. Anemia Panel: No results for input(s): VITAMINB12, FOLATE, FERRITIN, TIBC, IRON, RETICCTPCT in the last 72 hours. Sepsis Labs: Recent Labs  Lab 01/06/20 0308 01/09/20 0752  PROCALCITON 0.91 <0.10    Recent Results (from  the past 240 hour(s))  SARS Coronavirus 2 by RT PCR (hospital order, performed in [facility] hospital lab) Nasopharyngeal Nasopharyngeal Swab     Status: None   Collection Time: 01/04/20 12:58 PM   Specimen: Nasopharyngeal Swab  Result Value Ref Range Status   SARS Coronavirus 2 NEGATIVE NEGATIVE Final    Comment: (NOTE) SARS-CoV-2 target nucleic acids are NOT DETECTED.  The SARS-CoV-2 RNA is generally detectable in upper and lower respiratory specimens during the acute phase of infection. The lowest concentration of SARS-CoV-2 viral copies this assay can detect is 250 copies / mL. A negative result does not preclude SARS-CoV-2 infection and should not be used as the sole basis for treatment or other patient management decisions.  A negative result may occur with improper specimen collection / handling, submission of specimen other than nasopharyngeal swab, presence of viral mutation(s) within the areas targeted by this assay, and inadequate number of viral copies (<250 copies / mL). A negative result must be combined with clinical observations, patient history, and epidemiological information.  Fact Sheet for Patients:   BoilerBrush.com.cyhttps://www.fda.gov/media/136312/download  Fact Sheet for Healthcare Providers: https://pope.com/https://www.fda.gov/media/136313/download  This test is not yet approved or  cleared by the [place] FDA and has been authorized for detection and/or diagnosis of SARS-CoV-2 by FDA under an Emergency Use Authorization (EUA).  This EUA will remain in effect (meaning this test can be used) for the duration of the COVID-19 declaration under Section 564(b)(1) of the Act, 21 U.S.C. section 360bbb-3(b)(1), unless the authorization is terminated  or revoked sooner.  Performed at [facility], [address] [place], [place] [address]   MRSA PCR Screening     Status: Abnormal   Collection Time: 01/04/20 11:28 PM   Specimen: Nasopharyngeal  Result Value Ref Range Status   MRSA by PCR POSITIVE (A) NEGATIVE Final    Comment:        The GeneXpert MRSA Assay (FDA approved for NASAL specimens only), is one component of a comprehensive MRSA colonization surveillance program. It is not intended to diagnose MRSA infection nor to guide or monitor treatment for MRSA infections. RESULT CALLED TO, READ BACK BY AND VERIFIED WITH: A,ROTHERMEL AT 0118 ON 01/05/20 BY A,MOHAMED Performed at [facility], [address] [place], [place] [address]   Culture, blood (routine x 2)     Status: None (Preliminary result)   Collection Time: 01/09/20  6:13 AM   Specimen: BLOOD  Result Value Ref Range Status   Specimen Description   Final    BLOOD RIGHT ARM Performed at [facility], [address] [place], [place] [address]    Special Requests   Final    BOTTLES DRAWN AEROBIC AND ANAEROBIC Blood Culture adequate volume Performed at [facility], [address] [place], [place] [address]    Culture   Final    NO GROWTH 3 DAYS Performed at [facility], [address] [place], [place] [address]    Report Status PENDING  Incomplete  Culture, blood (routine x 2)     Status: Abnormal   Collection Time: 01/09/20  6:13 AM   Specimen: BLOOD  Result Value Ref Range Status   Specimen Description   Final    BLOOD BLOOD RIGHT HAND Performed at [facility], [address] [place], [place] [address]    Special Requests   Final    BOTTLES DRAWN AEROBIC ONLY Blood Culture adequate volume Performed at [facility], [address] [place], [place] [address]    Culture  Setup Time   Final    AEROBIC BOTTLE ONLY GRAM POSITIVE  COCCI Organism ID to follow CRITICAL RESULT CALLED TO, READ BACK BY AND VERIFIED WITH: L POINDEXTER PHARMD 01/10/20 0241 JDW    Culture (A)  Final    STAPHYLOCOCCUS EPIDERMIDIS STAPHYLOCOCCUS LUGDUNENSIS THE SIGNIFICANCE OF ISOLATING THIS ORGANISM FROM A SINGLE SET OF BLOOD CULTURES WHEN MULTIPLE SETS ARE DRAWN IS UNCERTAIN. PLEASE NOTIFY THE MICROBIOLOGY DEPARTMENT WITHIN ONE WEEK IF SPECIATION AND SENSITIVITIES ARE REQUIRED. Performed at [facility], [address] [place], [place] [address]    Report Status 01/12/2020 FINAL  Final  Blood Culture ID Panel (Reflexed)     Status: Abnormal   Collection Time: 01/09/20  6:13 AM  Result Value Ref Range Status   Enterococcus faecalis NOT DETECTED NOT DETECTED Final   Enterococcus Faecium NOT DETECTED NOT DETECTED Final   Listeria monocytogenes NOT DETECTED NOT DETECTED Final   Staphylococcus species DETECTED (A) NOT DETECTED Final    Comment: CRITICAL RESULT CALLED TO, READ BACK BY AND VERIFIED WITH: L POINDEXTER PHARMD 01/10/20 0241 JDW    Staphylococcus aureus (BCID) NOT DETECTED NOT DETECTED Final   Staphylococcus epidermidis DETECTED (A) NOT DETECTED Final    Comment: Methicillin (oxacillin) resistant coagulase negative staphylococcus. Possible blood culture contaminant (unless isolated from more than one blood culture draw or clinical case suggests pathogenicity). No antibiotic treatment is indicated for blood  culture contaminants. CRITICAL RESULT CALLED TO, READ BACK BY AND VERIFIED WITH: L POINDEXTER PHARMD 01/10/20 0241 JDW    Staphylococcus lugdunensis DETECTED (A) NOT DETECTED Final    Comment: Methicillin (oxacillin) resistant coagulase negative staphylococcus. Possible blood culture contaminant (unless isolated from more than one blood culture draw or clinical case suggests pathogenicity). No antibiotic treatment is indicated for blood  culture contaminants. CRITICAL RESULT CALLED TO, READ BACK BY AND VERIFIED WITH: L  POINDEXTER PHARMD 01/10/20 0241 JDW    Streptococcus species NOT DETECTED NOT DETECTED Final   Streptococcus agalactiae NOT DETECTED NOT DETECTED Final   Streptococcus pneumoniae NOT DETECTED NOT DETECTED Final   Streptococcus pyogenes NOT DETECTED NOT DETECTED Final   A.calcoaceticus-baumannii NOT DETECTED NOT DETECTED Final   Bacteroides fragilis NOT DETECTED NOT DETECTED Final   Enterobacterales NOT DETECTED NOT DETECTED Final   Enterobacter cloacae complex NOT DETECTED NOT DETECTED Final   Escherichia coli NOT DETECTED NOT DETECTED Final   Klebsiella aerogenes NOT DETECTED NOT DETECTED Final   Klebsiella oxytoca NOT DETECTED NOT DETECTED Final   Klebsiella pneumoniae NOT DETECTED NOT DETECTED Final   Proteus species NOT DETECTED NOT DETECTED Final   Salmonella species NOT DETECTED NOT DETECTED Final   Serratia marcescens NOT DETECTED NOT DETECTED Final   Haemophilus influenzae NOT DETECTED NOT DETECTED Final   Neisseria meningitidis NOT DETECTED NOT DETECTED Final   Pseudomonas aeruginosa NOT DETECTED NOT DETECTED Final   Stenotrophomonas maltophilia NOT DETECTED NOT DETECTED Final   Candida albicans NOT DETECTED NOT DETECTED Final   Candida auris NOT DETECTED NOT DETECTED Final   Candida glabrata NOT DETECTED NOT DETECTED Final   Candida krusei NOT DETECTED NOT DETECTED Final   Candida parapsilosis NOT DETECTED NOT DETECTED Final   Candida tropicalis NOT DETECTED NOT DETECTED Final   Cryptococcus neoformans/gattii NOT DETECTED NOT DETECTED Final   Methicillin resistance mecA/C DETECTED (A) NOT DETECTED Final    Comment: CRITICAL RESULT CALLED TO, READ BACK BY AND VERIFIED WITH: L  Loletta Specter [facility] 01/10/20 0241 JDW Performed at [facility], [address], [place], [place] [address]          Radiology Studies: No results found.      Scheduled Meds: . amLODipine  5 mg Oral Daily  . buprenorphine-naloxone  1 tablet Sublingual Daily  . enoxaparin (LOVENOX)  injection  40 mg Subcutaneous Q24H  . feeding supplement (KATE FARMS STANDARD 1.4)  325 mL Oral BID BM  . lidocaine  15 mL Oral Once  . mouth rinse  15 mL Mouth Rinse BID  . mometasone-formoterol  2 puff Inhalation BID  . multivitamin with minerals  1 tablet Oral Daily  . pantoprazole  40 mg Oral BID AC  . polyethylene glycol  17 g Oral BID  . potassium chloride  40 mEq Oral Daily  . senna-docusate  1 tablet Oral BID  . sucralfate  1 g Oral QID  . tamsulosin  0.4 mg Oral Daily   Continuous Infusions: . lactated ringers Stopped (01/10/20 0945)  . vancomycin 1,000 mg (01/12/20 0352)     LOS: 8 days     Briant Cedar, MD 01/12/2020, 11:15 AM

## 2020-01-12 NOTE — TOC Progression Note (Addendum)
Transition of Care [facility]) - Progression Note    Patient Details  Name: Vinay Ertl MRN: [id number] Date of Birth: 1946-04-20  [facility] CM/SW Contact  Joseph Art, [place] Phone Number: 01/12/2020, 3:27 PM  Clinical Narrative:     As per patient's brother, patient is fully vaccinated against COVID-19. Pts brother Zollie Beckers chose [facility] as preferred SNF.  CSW could not get PASRR, [employer] must website not working. FL2 complete, sent message in chat for Attending to sign it.  Reached out to [employer] from [facility] for bed offer.  Tresa Endo will reach out to [facility], patient is not in [employer] portal.  Expected Discharge Plan: Skilled Nursing Facility Barriers to Discharge: SNF Pending bed offer  Expected Discharge Plan and Services Expected Discharge Plan: Skilled Nursing Facility In-house Referral: Clinical Social Work Discharge Planning Services: CM Consult   Living arrangements for the past 2 months: Single Family Home                                       Social Determinants of Health (SDOH) Interventions    Readmission Risk Interventions No flowsheet data found.

## 2020-01-12 NOTE — [place] FL2 (Signed)
MEDICAID FL2 LEVEL OF CARE SCREENING TOOL     IDENTIFICATION  Patient Name: [name] Birthdate: Sep 27, 1945 Sex: male Admission Date (Current Location): 01/04/2020  [facility] and [place] Number:  Producer, television/film/video and Address:  [facility],  [address] [place], [place] [address]      Provider Number: [id number]  Attending Physician Name and Address:  Briant Cedar, MD  Relative Name and Phone Number:  Bain Whichard [phone], brother    Current Level of Care: Hospital Recommended Level of Care: Skilled Nursing Facility Prior Approval Number:    Date Approved/Denied:   PASRR Number:    Discharge Plan: SNF    Current Diagnoses: Patient Active Problem List   Diagnosis Date Noted  . Malnutrition of moderate degree 01/06/2020  . Acute esophagitis   . Gastrointestinal hemorrhage 01/04/2020  . Hypokalemia 01/04/2020  . Leukocytosis 01/04/2020  . Frontal headache   . Hypertension 11/08/2016  . COPD (chronic obstructive pulmonary disease) (HCC) 11/08/2016  . GERD (gastroesophageal reflux disease) 11/08/2016  . Acute kidney injury (HCC) 11/08/2016  . BPH (benign prostatic hyperplasia) 11/08/2016  . Polysubstance dependence (HCC) 11/08/2016  . Hip pain, bilateral 09/17/2014  . AKI (acute kidney injury) (HCC) 09/10/2014  . Orthostatic hypotension 09/10/2014  . Nausea and vomiting 09/10/2014  . Polysubstance abuse (HCC) 09/10/2014  . Syncope 09/09/2014  . Peptic ulcer disease   . Benign prostatic hypertrophy (BPH) with incomplete bladder emptying 03/17/2014  . CHEST PAIN, RIGHT 05/23/2007  . Essential hypertension 04/05/2007  . NON-HEALING SURGICAL WOUND NEC 03/19/2007  . METHICILLIN-RESISTANT STAPHYLOCOCCUS AUREUS PNEUMONIA 03/15/2007  . EMPYEMA 03/15/2007    Orientation RESPIRATION BLADDER Height & Weight     Self, Place  Normal Continent Weight: 154 lb 15.7 oz (70.3 kg) Height:  6' (182.9 cm)  BEHAVIORAL SYMPTOMS/MOOD  NEUROLOGICAL BOWEL NUTRITION STATUS      Continent Diet  AMBULATORY STATUS COMMUNICATION OF NEEDS Skin   Extensive Assist Verbally Normal                       Personal Care Assistance Level of Assistance  Bathing, Feeding, Dressing Bathing Assistance: Maximum assistance Feeding assistance: Limited assistance Dressing Assistance: Maximum assistance     Functional Limitations Info             SPECIAL CARE FACTORS FREQUENCY                       Contractures Contractures Info: Not present    Additional Factors Info                  Current Medications (01/12/2020):  This is the current hospital active medication list Current Facility-Administered Medications  Medication Dose Route Frequency Provider Last Rate Last Admin  . acetaminophen (TYLENOL) tablet 650 mg  650 mg Oral Q6H PRN Rachael Fee, MD      . albuterol (PROVENTIL) (2.5 MG/3ML) 0.083% nebulizer solution 2.5 mg  2.5 mg Nebulization Q6H PRN Rachael Fee, MD      . alum & mag hydroxide-simeth (MAALOX/MYLANTA) 200-200-20 MG/5ML suspension 30 mL  30 mL Oral Q4H PRN Rachael Fee, MD   30 mL at 01/09/20 1657   And  . lidocaine (XYLOCAINE) 2 % viscous mouth solution 15 mL  15 mL Oral Once Rachael Fee, MD      . amLODipine (NORVASC) tablet 5 mg  5 mg Oral Daily Rachael Fee, MD  5 mg at 01/12/20 0951  . buprenorphine-naloxone (SUBOXONE) 8-2 mg per SL tablet 1 tablet  1 tablet Sublingual Daily Rachael Fee, MD   1 tablet at 01/11/20 0932  . enoxaparin (LOVENOX) injection 40 mg  40 mg Subcutaneous Q24H Briant Cedar, MD   40 mg at 01/11/20 1613  . feeding supplement (KATE FARMS STANDARD 1.4) liquid 325 mL  325 mL Oral BID BM Rachael Fee, MD   325 mL at 01/12/20 1230  . lactated ringers infusion   Intravenous Continuous Rachael Fee, MD   Stopped at 01/10/20 0945  . MEDLINE mouth rinse  15 mL Mouth Rinse BID Rachael Fee, MD   15 mL at 01/12/20 6195  . melatonin  tablet 6 mg  6 mg Oral QHS PRN Rachael Fee, MD   6 mg at 01/10/20 2132  . mometasone-formoterol (DULERA) 200-5 MCG/ACT inhaler 2 puff  2 puff Inhalation BID Rachael Fee, MD   2 puff at 01/12/20 [phone]  . multivitamin with minerals tablet 1 tablet  1 tablet Oral Daily Rachael Fee, MD   1 tablet at 01/12/20 [phone]  . ondansetron (ZOFRAN) injection 4 mg  4 mg Intravenous Q6H PRN Rachael Fee, MD   4 mg at 01/10/20 1535  . pantoprazole (PROTONIX) EC tablet 40 mg  40 mg Oral BID AC Rachael Fee, MD   40 mg at 01/12/20 4580  . phenol (CHLORASEPTIC) mouth spray 1 spray  1 spray Mouth/Throat PRN Rachael Fee, MD      . polyethylene glycol (MIRALAX / GLYCOLAX) packet 17 g  17 g Oral BID Rachael Fee, MD   17 g at 01/12/20 0954  . potassium chloride SA (KLOR-CON) CR tablet 40 mEq  40 mEq Oral Daily Rachael Fee, MD   40 mEq at 01/12/20 9983  . senna-docusate (Senokot-S) tablet 1 tablet  1 tablet Oral BID Rachael Fee, MD   1 tablet at 01/12/20 [phone]  . sucralfate (CARAFATE) 1 GM/10ML suspension 1 g  1 g Oral QID Rachael Fee, MD   1 g at 01/12/20 0949  . tamsulosin (FLOMAX) capsule 0.4 mg  0.4 mg Oral Daily Rachael Fee, MD   0.4 mg at 01/12/20 0950  . vancomycin (VANCOCIN) IVPB 1000 mg/200 mL premix  1,000 mg Intravenous Q12H Rachael Fee, MD 200 mL/hr at 01/12/20 0352 1,000 mg at 01/12/20 0539     Discharge Medications: Please see discharge summary for a list of discharge medications.  Relevant Imaging Results:  Relevant Lab Results:   Additional Information SS# [id number]  Joseph Art, LCSWA

## 2020-01-12 NOTE — Plan of Care (Signed)
  Problem: Education: Goal: Knowledge of General Education information will improve Description Including pain rating scale, medication(s)/side effects and non-pharmacologic comfort measures Outcome: Progressing   

## 2020-01-12 NOTE — Progress Notes (Signed)
PT Cancellation Note  Patient Details Name: [name] MRN: [id number] DOB: February 22, 1946   Cancelled Treatment:     PT attempted x 2 but pt declined - initially wanting breakfast to settle and then back to bed and unwilling to move at this time.  Will follow.   Zakk Borgen 01/12/2020, 4:05 PM

## 2020-01-13 ENCOUNTER — Inpatient Hospital Stay (HOSPITAL_COMMUNITY): Payer: Medicare Other

## 2020-01-13 DIAGNOSIS — R7881 Bacteremia: Secondary | ICD-10-CM

## 2020-01-13 LAB — CBC WITH DIFFERENTIAL/PLATELET
Abs Immature Granulocytes: 0.03 10*3/uL (ref 0.00–0.07)
Basophils Absolute: 0 10*3/uL (ref 0.0–0.1)
Basophils Relative: 0 %
Eosinophils Absolute: 0.1 10*3/uL (ref 0.0–0.5)
Eosinophils Relative: 1 %
HCT: 29.9 % — ABNORMAL LOW (ref 39.0–52.0)
Hemoglobin: 8.6 g/dL — ABNORMAL LOW (ref 13.0–17.0)
Immature Granulocytes: 0 %
Lymphocytes Relative: 16 %
Lymphs Abs: 1.3 10*3/uL (ref 0.7–4.0)
MCH: 25.1 pg — ABNORMAL LOW (ref 26.0–34.0)
MCHC: 28.8 g/dL — ABNORMAL LOW (ref 30.0–36.0)
MCV: 87.2 fL (ref 80.0–100.0)
Monocytes Absolute: 0.9 10*3/uL (ref 0.1–1.0)
Monocytes Relative: 11 %
Neutro Abs: 5.8 10*3/uL (ref 1.7–7.7)
Neutrophils Relative %: 72 %
Platelets: 500 10*3/uL — ABNORMAL HIGH (ref 150–400)
RBC: 3.43 MIL/uL — ABNORMAL LOW (ref 4.22–5.81)
RDW: 20.4 % — ABNORMAL HIGH (ref 11.5–15.5)
WBC: 8.2 10*3/uL (ref 4.0–10.5)
nRBC: 0 % (ref 0.0–0.2)

## 2020-01-13 LAB — ECHOCARDIOGRAM COMPLETE
Area-P 1/2: 2.32 cm2
Height: 72 in
S' Lateral: 2.1 cm
Weight: 2479.73 oz

## 2020-01-13 LAB — BASIC METABOLIC PANEL
Anion gap: 10 (ref 5–15)
BUN: 9 mg/dL (ref 8–23)
CO2: 21 mmol/L — ABNORMAL LOW (ref 22–32)
Calcium: 8.5 mg/dL — ABNORMAL LOW (ref 8.9–10.3)
Chloride: 110 mmol/L (ref 98–111)
Creatinine, Ser: 0.73 mg/dL (ref 0.61–1.24)
GFR calc Af Amer: >60 mL/min (ref >60)
GFR calc non Af Amer: >60 mL/min (ref >60)
Glucose, Bld: 92 mg/dL (ref 70–99)
Potassium: 4.1 mmol/L (ref 3.5–5.1)
Sodium: 141 mmol/L (ref 135–145)

## 2020-01-13 LAB — MAGNESIUM: Magnesium: 1.6 mg/dL — ABNORMAL LOW (ref 1.7–2.4)

## 2020-01-13 LAB — SARS CORONAVIRUS 2 (TAT 6-24 HRS): SARS Coronavirus 2: NEGATIVE

## 2020-01-13 MED ORDER — ACETAMINOPHEN 325 MG PO TABS
650.0000 mg | ORAL_TABLET | Freq: Four times a day (QID) | ORAL | Status: DC | PRN
  Administered 2020-01-13 – 2020-01-16 (×3): 650 mg via ORAL
  Filled 2020-01-13 (×4): qty 2

## 2020-01-13 MED ORDER — MAGNESIUM SULFATE 2 GM/50ML IV SOLN
2.0000 g | Freq: Once | INTRAVENOUS | Status: AC
  Administered 2020-01-13: 2 g via INTRAVENOUS
  Filled 2020-01-13: qty 50

## 2020-01-13 NOTE — Care Management Important Message (Signed)
Important Message  Patient Details  IM Letter given to the Patient Name: [name] MRN: [id number] Date of Birth: 1946/03/11   Medicare Important Message Given:  Yes     Caren Macadam 01/13/2020, 10:52 AM

## 2020-01-13 NOTE — Progress Notes (Signed)
Nutrition Follow-up  DOCUMENTATION CODES:   Non-severe (moderate) malnutrition in context of chronic illness  INTERVENTION:  - continue [employer] BID and [employer] once/day.   NUTRITION DIAGNOSIS:   Moderate Malnutrition related to chronic illness (peptic ulcer disease) as evidenced by mild fat depletion, mild muscle depletion. -ongoing  GOAL:   Patient will meet greater than or equal to 90% of their needs -minimally met on average  MONITOR:   PO intake, Supplement acceptance, Diet advancement, Labs, Weight trends  ASSESSMENT:   74 y.o. male with medical history of peptic ulcer disease. He presented to the ED due to 4 days of N/V/D, black stools, and inability to keep down anything he ate or drank.  Last documented intakes were on 8/21 when he ate 0% of breakfast, 100% of lunch and dinner (total of 958 kcal, 34 grams protein). He has been accepting [employer] 1.4 po (455 kcal, 20 grams protein each) 50-75% of the time offered.   Weight on 8/20 was -5 lb compared to weight on 8/16; will continue to monitor.  Per notes: - UGIB, bleeding duodenal ulcer s/p EGD showing esophagitis, hiatal hernia, and gastritis and repeat EGD on 8/20 showing severe acid-related esophagitis and duodenal ulcers are healing - AKI--resolved - hx of polysubstance abuse    Labs reviewed; Ca: 8.5 mg/dl, Mg: 1.6 mg/dl.  Medications reviewed; 40 mg oral protonix BID, 17 g miralax BID, 40 mEq Klor-Con/day, 1 tablet senokot BID, 1 g carafate QID.  IVF; LR @ 20 ml/hr.    Diet Order:   Diet Order            Diet Heart Room service appropriate? Yes; Fluid consistency: Thin  Diet effective now                 EDUCATION NEEDS:   No education needs have been identified at this time  Skin:  Skin Assessment: Reviewed RN Assessment  Last BM:  8/20  Height:   Ht Readings from Last 1 Encounters:  01/06/20 6' (1.829 m)    Weight:   Wt Readings from Last 1 Encounters:  01/10/20 70.3 kg     Estimated Nutritional Needs:  Kcal:  1700-1900 kcal Protein:  80-90 grams Fluid:  >/= 2.1 L/day     Jarome Matin, MS, RD, LDN, CNSC Inpatient Clinical Dietitian RD pager # available in [facility]  After hours/weekend pager # available in [facility]

## 2020-01-13 NOTE — Progress Notes (Signed)
Pt c/o pain in his mid abdomen 8/10. MD is notified via secure chat. New orders received. Made Pt comfortable.

## 2020-01-13 NOTE — Progress Notes (Signed)
Pharmacy Antibiotic Note  [name] is a 74 y.o. male admitted on 01/04/2020 with GI bleed and leukocytosis.  Pharmacy has been consulted for Vancomycin dosing for +BCID S. Epidermidis & S.Lugdunesis with +meth resistance.  ? Intended LOT of abx  Plan:  Vancomycin 1500mg  IV x 1 then 1000mg  IV q12h  Follow renal function  Follow culture sensitivities  Monitor vancomycin levels as needed  Height: 6' (182.9 cm) Weight: 70.3 kg (154 lb 15.7 oz) IBW/kg (Calculated) : 77.6  Temp (24hrs), Avg:99.2 F (37.3 C), Min:99.1 F (37.3 C), Max:99.2 F (37.3 C)  Recent Labs  Lab 01/09/20 0613 01/10/20 0222 01/11/20 0229 01/12/20 0526 01/13/20 0558  WBC 17.5* 13.6* 17.1* 13.2* 8.2  CREATININE 0.74 0.67 0.71 0.73 0.73    Estimated Creatinine Clearance: 84.2 mL/min (by C-G formula based on SCr of 0.73 mg/dL).    No Known Allergies  Antimicrobials this admission: 8/20 Vanc >>    Dose adjustments this admission:   Microbiology results: 8/20 BCx: S. Epidermidis & S.Lugdunesis with +meth resistance 8/14 MRSA PCR: positive  Thank you for allowing pharmacy to be a part of this patient's care.  9/20, PharmD 01/13/2020 1:46 PM

## 2020-01-13 NOTE — Plan of Care (Signed)

## 2020-01-13 NOTE — Progress Notes (Signed)
Echocardiogram 2D Echocardiogram has been performed.  Warren Lacy Kerensa Nicklas 01/13/2020, 1:45 PM

## 2020-01-13 NOTE — Progress Notes (Signed)
Physical Therapy Treatment Patient Details Name: [name] MRN: [id number] DOB: 01/01/1946 Today's Date: 01/13/2020    History of Present Illness [name] is a 74 y.o. male with medical history significant of peptic ulcer disease admitted with nausea vomiting and black stools. Upper GI bleed EGD shows esophagitis with bleeding which was biopsied.  Hiatal hernia and gastritis.  Nonbleeding duodenal ulcer    PT Comments    Pt initially agreeable to ambulate however upon sitting EOB reported feeling too weak.  Pt encouraged to mobilize to maintain function and strength, so pt agreeable to at least OOB to recliner however still declined ambulation today.    Follow Up Recommendations  SNF;Supervision/Assistance - 24 hour     Equipment Recommendations  None recommended by PT    Recommendations for Other Services       Precautions / Restrictions Precautions Precautions: Fall    Mobility  Bed Mobility Overal bed mobility: Needs Assistance Bed Mobility: Supine to Sit     Supine to sit: Min guard     General bed mobility comments: increased time, assist for lines only  Transfers Overall transfer level: Needs assistance Equipment used: Rolling walker (2 wheeled) Transfers: Sit to/from Stand Sit to Stand: Min guard Stand pivot transfers: Min guard       General transfer comment: verbal cues for safe technique, min/guard for safety  Ambulation/Gait             General Gait Details: pt declined, only agreeable to OOB to recliner today   Stairs             Wheelchair Mobility    Modified Rankin (Stroke Patients Only)       Balance                                            Cognition Arousal/Alertness: Awake/alert Behavior During Therapy: WFL for tasks assessed/performed Overall Cognitive Status: Within Functional Limits for tasks assessed                                        Exercises       General Comments        Pertinent Vitals/Pain Pain Assessment: Faces Faces Pain Scale: No hurt Pain Intervention(s): Repositioned    Home Living                      Prior Function            PT Goals (current goals can now be found in the care plan section) Progress towards PT goals: Progressing toward goals    Frequency    Min 3X/week      PT Plan Current plan remains appropriate    Co-evaluation              AM-PAC PT "6 Clicks" Mobility   Outcome Measure  Help needed turning from your back to your side while in a flat bed without using bedrails?: A Little Help needed moving from lying on your back to sitting on the side of a flat bed without using bedrails?: A Little Help needed moving to and from a bed to a chair (including a wheelchair)?: A Little Help needed standing up from a chair using your arms (e.g., wheelchair or bedside chair)?:  A Little Help needed to walk in hospital room?: A Little Help needed climbing 3-5 steps with a railing? : A Lot 6 Click Score: 17    End of Session Equipment Utilized During Treatment: Gait belt Activity Tolerance: Patient tolerated treatment well Patient left: in chair;with call bell/phone within reach;with chair alarm set Nurse Communication: Mobility status PT Visit Diagnosis: Difficulty in walking, not elsewhere classified (R26.2);Unsteadiness on feet (R26.81)     Time: 2355-7322 PT Time Calculation (min) (ACUTE ONLY): 12 min  Charges:  $Therapeutic Activity: 8-22 mins                     Thomasene Mohair PT, DPT Acute Rehabilitation Services Pager: [phone] Office: [phone]   Maida Sale E 01/13/2020, 1:17 PM

## 2020-01-13 NOTE — Progress Notes (Signed)
PROGRESS NOTE    [name]  YYQ:[id number] DOB: [birth year] DOA: 01/04/2020 PCP: [name], MD   Brief Narrative:  [name] is a 74 y.o. male with medical history significant of peptic ulcer disease admitted with abdominal pain, nausea, vomiting and black stools. Pt reports he has not been able to eat or drink anything for the last 4 days not been able to keep it down as he was vomiting.  Significant weight loss.  Pt takes Goody powder and aspirin and multiple antihypertensives at home. He is also on Suboxone at home. ED Course: His potassium was 2.5 sodium was 135 BUN 87 creatinine 6.47 anion gap was 26, white count 22.2, hemoglobin 7.8 platelets 496 INR 1.3, Blood pressure 95/56 came up to 105/ 54 heart rate 90 respiration 19 sats 99% on 2 L temperature 98.7. He was seen by PCCM in the ER and asked hospitalist to admit the patient.  Assessment & Plan:   Active Problems:   Gastrointestinal hemorrhage   Hypokalemia   Leukocytosis   Acute esophagitis   Malnutrition of moderate degree   Upper GI bleed likely 2/2 bleeding duodenal ulcer  GI on board, s/p EGD showed esophagitis with bleeding which was biopsied.  Hiatal hernia and gastritis.  Nonbleeding duodenal ulcer with a clean ulcer base Repeat EGD on 01/10/20 showed severe acid related esophagitis which is still present, duodenal ulcers are clearly healing CT abdomen/pelvis unremarkable Continue PPI, carafate   Acute blood loss anemia Likely 2/2 above Hemoglobin dropped to 6.7 on 8/17, transfused 2 units of PRBC Daily CBC  Hypokalemia/hypomagnesemia Replace as needed Daily BMP, magnesium  ??Sepsis likely 2/2 bacteremia 2/2 staph epidermidis/lugdunensis Temp spike on 01/09/20 was 100.69F, with leukocytosis BC X 2 grew staph epidermidis/lugdunensis, repeat pending Chest x-ray unremarkable UA with large leukocytes, rare bacteria, greater than 50 WBC Procalcitonin negative CT abdomen/pelvis  unremarkable TTE done on 01/13/2020 negative for any evidence of endocarditis Discussed with ID, Dr Luciana Axe on 01/13/2020, rec to wait for repeat blood culture x48 hours, if no further growth, may stop IV vancomycin Continue Vancomycin  Daily CBC  Resolved AKI with acidosis/hypernatremia Likely 2/2 poor oral intake Daily BMP  Hypertension Continue Norvasc  Urinary retention with history of BPH  Monitor closely  History of polysubstance abuse Urine drug screen positive for cocaine Continue Suboxone      Estimated body mass index is 21.02 kg/m as calculated from the following:   Height as of this encounter: 6' (1.829 m).   Weight as of this encounter: 70.3 kg.  DVT prophylaxis: Lovenox Code Status: Full code  family Communication: None at bedside Disposition Plan:  Status is: Inpatient  Dispo: The patient is from: Home              Anticipated d/c is to: Home              Anticipated d/c date is more than 2 days              Patient currently is not medically stable to d/c.     Consultants:   GI  Procedures: EGD 01/05/2020 and 01/10/20  Antimicrobials: None  Subjective: Patient denies any new complaints.   objective: Vitals:   01/12/20 2113 01/12/20 2138 01/13/20 0540 01/13/20 0906  BP: 110/60  (!) 151/85   Pulse: (!) 101  91   Resp: 16  15   Temp: 99.1 F (37.3 C)  99.2 F (37.3 C)   TempSrc: Oral  Oral   SpO2:  98% 97% 98% 98%  Weight:      Height:        Intake/Output Summary (Last 24 hours) at 01/13/2020 1355 Last data filed at 01/13/2020 1000 Gross per 24 hour  Intake 666.66 ml  Output 800 ml  Net -133.34 ml   Filed Weights   01/06/20 1310 01/10/20 0306  Weight: 72.8 kg 70.3 kg    Examination:  General: NAD  Cardiovascular: S1, S2 present  Respiratory: CTAB  Abdomen: Soft, non-tender, nondistended, bowel sounds present  Musculoskeletal: No bilateral pedal edema noted  Skin: Normal  Psychiatry:  Normal mood     Data Reviewed: I  have personally reviewed following labs and imaging studies  CBC: Recent Labs  Lab 01/09/20 0613 01/09/20 0613 01/09/20 1249 01/10/20 0222 01/11/20 0229 01/12/20 0526 01/13/20 0558  WBC 17.5*  --   --  13.6* 17.1* 13.2* 8.2  NEUTROABS 14.6*  --   --  11.0* 14.5* 10.9* 5.8  HGB 9.4*   < > 8.9* 8.4* 8.6* 8.2* 8.6*  HCT 31.0*   < > 28.4* 27.4* 28.1* 27.5* 29.9*  MCV 82.9  --   --  80.8 80.5 86.2 87.2  PLT 325  --   --  178 447* 461* 500*   < > = values in this interval not displayed.   Basic Metabolic Panel: Recent Labs  Lab 01/07/20 0241 01/07/20 [id number] 01/08/20 0237 01/08/20 0237 01/09/20 [id number] 01/10/20 0222 01/11/20 0229 01/11/20 0743 01/12/20 0526 01/13/20 0558  NA  --    < > 149*   < > 141 140 142  --  140 141  K  --    < > 3.4*   < > 3.2* 3.4* 3.6  --  4.0 4.1  CL  --    < > 118*   < > 110 112* 110  --  110 110  CO2  --    < > 18*   < > 21* 19* 21*  --  21* 21*  GLUCOSE  --    < > 115*   < > 116* 102* 107*  --  94 92  BUN  --    < > 16   < > 15 11 16   --  13 9  CREATININE  --    < > 0.82   < > 0.74 0.67 0.71  --  0.73 0.73  CALCIUM  --    < > 7.9*   < > 7.7* 7.7* 8.1*  --  8.4* 8.5*  MG 1.7  --  1.2*   < > 1.5* 1.4*  --  1.5* 1.7 1.6*  PHOS 2.1*  --  2.3*  --  2.6 2.7  --  2.5  --   --    < > = values in this interval not displayed.   GFR: Estimated Creatinine Clearance: 84.2 mL/min (by C-G formula based on SCr of 0.73 mg/dL). Liver Function Tests: No results for input(s): AST, ALT, ALKPHOS, BILITOT, PROT, ALBUMIN in the last 168 hours. No results for input(s): LIPASE, AMYLASE in the last 168 hours. No results for input(s): AMMONIA in the last 168 hours. Coagulation Profile: No results for input(s): INR, PROTIME in the last 168 hours. Cardiac Enzymes: No results for input(s): CKTOTAL, CKMB, CKMBINDEX, TROPONINI in the last 168 hours. BNP (last 3 results) No results for input(s): PROBNP in the last 8760 hours. HbA1C: No results for input(s): HGBA1C in the last  72 hours. CBG: No results for input(s): GLUCAP in  the last 168 hours. Lipid Profile: No results for input(s): CHOL, HDL, LDLCALC, TRIG, CHOLHDL, LDLDIRECT in the last 72 hours. Thyroid Function Tests: No results for input(s): TSH, T4TOTAL, FREET4, T3FREE, THYROIDAB in the last 72 hours. Anemia Panel: No results for input(s): VITAMINB12, FOLATE, FERRITIN, TIBC, IRON, RETICCTPCT in the last 72 hours. Sepsis Labs: Recent Labs  Lab 01/09/20 0752  PROCALCITON <0.10    Recent Results (from the past 240 hour(s))  SARS Coronavirus 2 by RT PCR (hospital order, performed in [facility] hospital lab) Nasopharyngeal Nasopharyngeal Swab     Status: None   Collection Time: 01/04/20 12:58 PM   Specimen: Nasopharyngeal Swab  Result Value Ref Range Status   SARS Coronavirus 2 NEGATIVE NEGATIVE Final    Comment: (NOTE) SARS-CoV-2 target nucleic acids are NOT DETECTED.  The SARS-CoV-2 RNA is generally detectable in upper and lower respiratory specimens during the acute phase of infection. The lowest concentration of SARS-CoV-2 viral copies this assay can detect is 250 copies / mL. A negative result does not preclude SARS-CoV-2 infection and should not be used as the sole basis for treatment or other patient management decisions.  A negative result may occur with improper specimen collection / handling, submission of specimen other than nasopharyngeal swab, presence of viral mutation(s) within the areas targeted by this assay, and inadequate number of viral copies (<250 copies / mL). A negative result must be combined with clinical observations, patient history, and epidemiological information.  Fact Sheet for Patients:   BoilerBrush.com.cy  Fact Sheet for Healthcare Providers: https://pope.com/  This test is not yet approved or  cleared by the [place] FDA and has been authorized for detection and/or diagnosis of SARS-CoV-2 by FDA under an  Emergency Use Authorization (EUA).  This EUA will remain in effect (meaning this test can be used) for the duration of the COVID-19 declaration under Section 564(b)(1) of the Act, 21 U.S.C. section 360bbb-3(b)(1), unless the authorization is terminated or revoked sooner.  Performed at [facility], [address] [place], [place] [address]   MRSA PCR Screening     Status: Abnormal   Collection Time: 01/04/20 11:28 PM   Specimen: Nasopharyngeal  Result Value Ref Range Status   MRSA by PCR POSITIVE (A) NEGATIVE Final    Comment:        The GeneXpert MRSA Assay (FDA approved for NASAL specimens only), is one component of a comprehensive MRSA colonization surveillance program. It is not intended to diagnose MRSA infection nor to guide or monitor treatment for MRSA infections. RESULT CALLED TO, READ BACK BY AND VERIFIED WITH: A,ROTHERMEL AT 0118 ON 01/05/20 BY A,MOHAMED Performed at [facility], [address] [place], [place] [address]   Culture, blood (routine x 2)     Status: None (Preliminary result)   Collection Time: 01/09/20  6:13 AM   Specimen: BLOOD  Result Value Ref Range Status   Specimen Description   Final    BLOOD RIGHT ARM Performed at [facility], [address], [place], [place] [address]    Special Requests   Final    BOTTLES DRAWN AEROBIC AND ANAEROBIC Blood Culture adequate volume Performed at [facility], [address] [place], [place] [address]    Culture   Final    NO GROWTH 4 DAYS Performed at [facility], [address], [place], [place] [address]    Report Status PENDING  Incomplete  Culture, blood (routine x 2)  Status: Abnormal   Collection Time: 01/09/20  6:13 AM   Specimen: BLOOD  Result Value Ref Range Status   Specimen Description   Final    BLOOD BLOOD RIGHT HAND Performed at [facility], [address] [place], [place]  [address]    Special Requests   Final    BOTTLES DRAWN AEROBIC ONLY Blood Culture adequate volume Performed at [facility], [address] [place], [place] [address]    Culture  Setup Time   Final    AEROBIC BOTTLE ONLY GRAM POSITIVE COCCI Organism ID to follow CRITICAL RESULT CALLED TO, READ BACK BY AND VERIFIED WITH: L POINDEXTER PHARMD 01/10/20 0241 JDW    Culture (A)  Final    STAPHYLOCOCCUS EPIDERMIDIS STAPHYLOCOCCUS LUGDUNENSIS THE SIGNIFICANCE OF ISOLATING THIS ORGANISM FROM A SINGLE SET OF BLOOD CULTURES WHEN MULTIPLE SETS ARE DRAWN IS UNCERTAIN. PLEASE NOTIFY THE MICROBIOLOGY DEPARTMENT WITHIN ONE WEEK IF SPECIATION AND SENSITIVITIES ARE REQUIRED. Performed at [facility], [address] [place], [place] [address]    Report Status 01/12/2020 FINAL  Final  Blood Culture ID Panel (Reflexed)     Status: Abnormal   Collection Time: 01/09/20  6:13 AM  Result Value Ref Range Status   Enterococcus faecalis NOT DETECTED NOT DETECTED Final   Enterococcus Faecium NOT DETECTED NOT DETECTED Final   Listeria monocytogenes NOT DETECTED NOT DETECTED Final   Staphylococcus species DETECTED (A) NOT DETECTED Final    Comment: CRITICAL RESULT CALLED TO, READ BACK BY AND VERIFIED WITH: L POINDEXTER PHARMD 01/10/20 0241 JDW    Staphylococcus aureus (BCID) NOT DETECTED NOT DETECTED Final   Staphylococcus epidermidis DETECTED (A) NOT DETECTED Final    Comment: Methicillin (oxacillin) resistant coagulase negative staphylococcus. Possible blood culture contaminant (unless isolated from more than one blood culture draw or clinical case suggests pathogenicity). No antibiotic treatment is indicated for blood  culture contaminants. CRITICAL RESULT CALLED TO, READ BACK BY AND VERIFIED WITH: L POINDEXTER PHARMD 01/10/20 0241 JDW    Staphylococcus lugdunensis DETECTED (A) NOT DETECTED Final    Comment: Methicillin (oxacillin) resistant coagulase negative staphylococcus. Possible  blood culture contaminant (unless isolated from more than one blood culture draw or clinical case suggests pathogenicity). No antibiotic treatment is indicated for blood  culture contaminants. CRITICAL RESULT CALLED TO, READ BACK BY AND VERIFIED WITH: L POINDEXTER PHARMD 01/10/20 0241 JDW    Streptococcus species NOT DETECTED NOT DETECTED Final   Streptococcus agalactiae NOT DETECTED NOT DETECTED Final   Streptococcus pneumoniae NOT DETECTED NOT DETECTED Final   Streptococcus pyogenes NOT DETECTED NOT DETECTED Final   A.calcoaceticus-baumannii NOT DETECTED NOT DETECTED Final   Bacteroides fragilis NOT DETECTED NOT DETECTED Final   Enterobacterales NOT DETECTED NOT DETECTED Final   Enterobacter cloacae complex NOT DETECTED NOT DETECTED Final   Escherichia coli NOT DETECTED NOT DETECTED Final   Klebsiella aerogenes NOT DETECTED NOT DETECTED Final   Klebsiella oxytoca NOT DETECTED NOT DETECTED Final   Klebsiella pneumoniae NOT DETECTED NOT DETECTED Final   Proteus species NOT DETECTED NOT DETECTED Final   Salmonella species NOT DETECTED NOT DETECTED Final   Serratia marcescens NOT DETECTED NOT DETECTED Final   Haemophilus influenzae NOT DETECTED NOT DETECTED Final   Neisseria meningitidis NOT DETECTED NOT DETECTED Final   Pseudomonas aeruginosa NOT DETECTED NOT DETECTED Final   Stenotrophomonas maltophilia NOT DETECTED NOT DETECTED Final   Candida albicans NOT DETECTED NOT DETECTED Final   Candida auris NOT DETECTED NOT DETECTED Final  Candida glabrata NOT DETECTED NOT DETECTED Final   Candida krusei NOT DETECTED NOT DETECTED Final   Candida parapsilosis NOT DETECTED NOT DETECTED Final   Candida tropicalis NOT DETECTED NOT DETECTED Final   Cryptococcus neoformans/gattii NOT DETECTED NOT DETECTED Final   Methicillin resistance mecA/C DETECTED (A) NOT DETECTED Final    Comment: CRITICAL RESULT CALLED TO, READ BACK BY AND VERIFIED WITH: L POINDEXTER PHARMD 01/10/20 0241 JDW Performed at  [facility], [address] [place], [place] [address]   Culture, blood (routine x 2)     Status: None (Preliminary result)   Collection Time: 01/12/20 11:43 AM   Specimen: BLOOD  Result Value Ref Range Status   Specimen Description   Final    BLOOD BLOOD LEFT ARM Performed at [facility], [address], [place], [place] [address]    Special Requests   Final    BOTTLES DRAWN AEROBIC ONLY Blood Culture adequate volume Performed at [facility], [address] [place], [place] [address]    Culture   Final    NO GROWTH < 24 HOURS Performed at [facility], [address] [place], [place] [address]    Report Status PENDING  Incomplete  Culture, blood (routine x 2)     Status: None (Preliminary result)   Collection Time: 01/12/20 11:43 AM   Specimen: BLOOD  Result Value Ref Range Status   Specimen Description   Final    BLOOD BLOOD LEFT ARM Performed at [facility], [address] [place], [place] [address]    Special Requests   Final    BOTTLES DRAWN AEROBIC AND ANAEROBIC Blood Culture adequate volume Performed at [facility], [address], [place], [place] [address]    Culture   Final    NO GROWTH < 24 HOURS Performed at [facility], [address], [place], [place] [address]    Report Status PENDING  Incomplete         Radiology Studies: No results found.      Scheduled Meds: . amLODipine  5 mg Oral Daily  . buprenorphine-naloxone  1 tablet Sublingual Daily  . enoxaparin (LOVENOX) injection  40 mg Subcutaneous Q24H  . feeding supplement ([employer] STANDARD 1.4)  325 mL Oral BID BM  . lidocaine  15 mL Oral Once  . mouth rinse  15 mL Mouth Rinse BID  . mometasone-formoterol  2 puff Inhalation BID  . multivitamin with minerals  1 tablet Oral Daily  . pantoprazole  40 mg Oral BID AC  . polyethylene glycol  17 g Oral BID  . potassium chloride  40 mEq Oral  Daily  . senna-docusate  1 tablet Oral BID  . sucralfate  1 g Oral QID  . tamsulosin  0.4 mg Oral Daily   Continuous Infusions: . lactated ringers 20 mL/hr at 01/12/20 1640  . vancomycin 1,000 mg (01/13/20 0426)     LOS: 9 days     Briant Cedar, MD 01/13/2020, 1:55 PM

## 2020-01-13 NOTE — TOC Progression Note (Signed)
Transition of Care [facility]) - Progression Note    Patient Details  Name: [name] MRN: [id number] Date of Birth: [birth year]  Transition of Care [facility]) CM/SW Contact  Armanda Heritage, RN Phone Number: 01/13/2020, 3:03 PM  Clinical Narrative:    CM spoke with patient's bother and presented bed offers.  Brother initially wanted [facility], however facility may not have a bed available for patient until Thursday or later.  Brother now Education officer, community.  Facility rep notified of choice.  Insurance auth initiated (Referance id [id number]).  Patient will need an updated Covid test prior to facility admit.  Brother confirms patient has received both doses of the Covid vaccine.   Expected Discharge Plan: Skilled Nursing Facility Barriers to Discharge: Continued Medical Work up  Expected Discharge Plan and Services Expected Discharge Plan: Skilled Nursing Facility In-house Referral: Clinical Social Work Discharge Planning Services: CM Consult Post Acute Care Choice: Skilled Nursing Facility Living arrangements for the past 2 months: Single Family Home                                       Social Determinants of Health (SDOH) Interventions    Readmission Risk Interventions No flowsheet data found.

## 2020-01-14 ENCOUNTER — Inpatient Hospital Stay (HOSPITAL_COMMUNITY): Payer: Medicare Other

## 2020-01-14 LAB — CULTURE, BLOOD (ROUTINE X 2)
Culture: NO GROWTH
Special Requests: ADEQUATE

## 2020-01-14 MED ORDER — METHOCARBAMOL 500 MG PO TABS
750.0000 mg | ORAL_TABLET | Freq: Three times a day (TID) | ORAL | Status: DC | PRN
  Administered 2020-01-14 – 2020-01-17 (×5): 750 mg via ORAL
  Filled 2020-01-14 (×5): qty 2

## 2020-01-14 MED ORDER — IOHEXOL 300 MG/ML  SOLN
100.0000 mL | Freq: Once | INTRAMUSCULAR | Status: AC | PRN
  Administered 2020-01-14: 100 mL via INTRAVENOUS

## 2020-01-14 NOTE — Progress Notes (Signed)
PROGRESS NOTE    [name]  ZOX:[id number] DOB: 1946/03/12 DOA: 01/04/2020 PCP: [name], MD   Brief Narrative:  [name] is a 74 y.o. male with medical history significant of peptic ulcer disease admitted with abdominal pain, nausea, vomiting and black stools. Pt reports he has not been able to eat or drink anything for the last 4 days not been able to keep it down as he was vomiting.  Significant weight loss.  Pt takes Goody powder and aspirin and multiple antihypertensives at home. He is also on Suboxone at home. ED Course: His potassium was 2.5 sodium was 135 BUN 87 creatinine 6.47 anion gap was 26, white count 22.2, hemoglobin 7.8 platelets 496 INR 1.3, Blood pressure 95/56 came up to 105/ 54 heart rate 90 respiration 19 sats 99% on 2 L temperature 98.7. He was seen by PCCM in the ER and asked hospitalist to admit the patient.  Assessment & Plan:   Active Problems:   Gastrointestinal hemorrhage   Hypokalemia   Leukocytosis   Acute esophagitis   Malnutrition of moderate degree   Upper GI bleed likely 2/2 bleeding duodenal ulcer  GI on board, s/p EGD showed esophagitis with bleeding which was biopsied.  Hiatal hernia and gastritis.  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 HIDA scan ordered  Acute blood loss anemia Likely  2/2 above Hemoglobin dropped to 6.7 on 8/17, transfused 2 units of PRBC, has remained stable Daily CBC  Hypokalemia/hypomagnesemia Replace as needed Daily BMP, magnesium  ??Sepsis likely 2/2 bacteremia 2/2 staph epidermidis/lugdunensis ??  Possible contaminant Temp spike on 01/09/20 was 100.73F, with leukocytosis BC X 2 grew staph epidermidis/lugdunensis, repeat BC x2 NGTD Chest x-ray unremarkable UA with large leukocytes, rare bacteria, greater than 50 WBC Procalcitonin negative CT abdomen/pelvis unremarkable TTE done on 01/13/2020 negative for any evidence of endocarditis Discussed with ID, Dr Luciana Axe on 01/13/2020, rec to wait for repeat blood culture x48 hours, if no further growth, may stop IV vancomycin Continue Vancomycin, plan to DC on 01/15/2020 to complete 5 days Daily CBC  Resolved AKI with acidosis/hypernatremia Likely 2/2 poor oral intake Daily BMP  Hypertension Continue Norvasc  Urinary retention with history of BPH  Monitor closely  History of polysubstance abuse Urine drug screen positive for cocaine Continue Suboxone      Estimated body mass index is 21.02 kg/m as calculated from the following:   Height as of this encounter: 6' (1.829 m).   Weight as of this encounter: 70.3 kg.  DVT prophylaxis: Lovenox Code Status: Full code  family Communication: None at bedside Disposition Plan:  Status is: Inpatient  Dispo: The patient is from: Home              Anticipated d/c is to: SNF              Anticipated d/c date in 1 day              Patient currently is not medically  stable to d/c.     Consultants:   GI  Procedures: EGD 01/05/2020 and 01/10/20  Antimicrobials: None  Subjective: Patient continues to complain of generalized abdominal pain, denies any nausea/vomiting, fever/chills, chest pain, shortness of breath.   objective: Vitals:   01/13/20 2050 01/14/20 0546 01/14/20 0915 01/14/20 1253  BP: 130/81 132/78  128/81  Pulse: 96 99  (!) 105    Resp: (!) 22 18  18   Temp: 99.5 F (37.5 C) 100 F (37.8 C)  98.4 F (36.9 C)  TempSrc: Oral Oral  Oral  SpO2: 98% 99% 97% 96%  Weight:      Height:        Intake/Output Summary (Last 24 hours) at 01/14/2020 1706 Last data filed at 01/14/2020 1530 Gross per 24 hour  Intake 1020 ml  Output 1425 ml  Net -405 ml   Filed Weights   01/06/20 1310 01/10/20 0306  Weight: 72.8 kg 70.3 kg    Examination:  General: NAD  Cardiovascular: S1, S2 present  Respiratory: CTAB  Abdomen: Soft, generalized tenderness, nondistended, bowel sounds present  Musculoskeletal: No bilateral pedal edema noted  Skin: Normal  Psychiatry:  Normal mood     Data Reviewed: I have personally reviewed following labs and imaging studies  CBC: Recent Labs  Lab 01/09/20 0613 01/09/20 0613 01/09/20 1249 01/10/20 0222 01/11/20 0229 01/12/20 0526 01/13/20 0558  WBC 17.5*  --   --  13.6* 17.1* 13.2* 8.2  NEUTROABS 14.6*  --   --  11.0* 14.5* 10.9* 5.8  HGB 9.4*   < > 8.9* 8.4* 8.6* 8.2* 8.6*  HCT 31.0*   < > 28.4* 27.4* 28.1* 27.5* 29.9*  MCV 82.9  --   --  80.8 80.5 86.2 87.2  PLT 325  --   --  178 447* 461* 500*   < > = values in this interval not displayed.   Basic Metabolic Panel: Recent Labs  Lab 01/08/20 0237 01/08/20 0237 01/09/20 1191 01/10/20 0222 01/11/20 0229 01/11/20 0743 01/12/20 0526 01/13/20 0558  NA 149*   < > 141 140 142  --  140 141  K 3.4*   < > 3.2* 3.4* 3.6  --  4.0 4.1  CL 118*   < > 110 112* 110  --  110 110  CO2 18*   < > 21* 19* 21*  --  21* 21*  GLUCOSE 115*   < > 116* 102* 107*  --  94 92  BUN 16   < > 15 11 16   --  13 9  CREATININE 0.82   < > 0.74 0.67 0.71  --  0.73 0.73  CALCIUM 7.9*   < > 7.7* 7.7* 8.1*  --  8.4* 8.5*  MG 1.2*   < > 1.5* 1.4*  --  1.5* 1.7 1.6*  PHOS 2.3*  --  2.6 2.7  --  2.5  --   --    < > = values in this interval not displayed.   GFR: Estimated Creatinine Clearance: 84.2 mL/min (by C-G formula based on SCr of 0.73  mg/dL). Liver Function Tests: No results for input(s): AST, ALT, ALKPHOS, BILITOT, PROT, ALBUMIN in the last 168 hours. No results for input(s): LIPASE, AMYLASE in the last 168 hours. No results for input(s): AMMONIA in the last 168 hours. Coagulation Profile: No results for input(s): INR, PROTIME in the last 168 hours. Cardiac Enzymes: No results for input(s): CKTOTAL, CKMB, CKMBINDEX, TROPONINI in the last 168 hours. BNP (last  3 results) No results for input(s): PROBNP in the last 8760 hours. HbA1C: No results for input(s): HGBA1C in the last 72 hours. CBG: No results for input(s): GLUCAP in the last 168 hours. Lipid Profile: No results for input(s): CHOL, HDL, LDLCALC, TRIG, CHOLHDL, LDLDIRECT in the last 72 hours. Thyroid Function Tests: No results for input(s): TSH, T4TOTAL, FREET4, T3FREE, THYROIDAB in the last 72 hours. Anemia Panel: No results for input(s): VITAMINB12, FOLATE, FERRITIN, TIBC, IRON, RETICCTPCT in the last 72 hours. Sepsis Labs: Recent Labs  Lab 01/09/20 0752  PROCALCITON <0.10    Recent Results (from the past 240 hour(s))  MRSA PCR Screening     Status: Abnormal   Collection Time: 01/04/20 11:28 PM   Specimen: Nasopharyngeal  Result Value Ref Range Status   MRSA by PCR POSITIVE (A) NEGATIVE Final    Comment:        The GeneXpert MRSA Assay (FDA approved for NASAL specimens only), is one component of a comprehensive MRSA colonization surveillance program. It is not intended to diagnose MRSA infection nor to guide or monitor treatment for MRSA infections. RESULT CALLED TO, READ BACK BY AND VERIFIED WITH: A,ROTHERMEL AT 0118 ON 01/05/20 BY A,MOHAMED Performed at [facility], [address], [place], [place] [address]   Culture, blood (routine x 2)     Status: None   Collection Time: 01/09/20  6:13 AM   Specimen: BLOOD  Result Value Ref Range Status   Specimen Description   Final    BLOOD RIGHT ARM Performed at [facility], [address], [place], [place] [address]    Special Requests   Final    BOTTLES DRAWN AEROBIC AND ANAEROBIC Blood Culture adequate volume Performed at [facility], [address] [place], [place] [address]    Culture   Final    NO GROWTH 5 DAYS Performed at [facility], [address] [place], [place] [address]    Report Status 01/14/2020 FINAL  Final  Culture, blood (routine x 2)     Status: Abnormal   Collection Time: 01/09/20  6:13 AM   Specimen: BLOOD  Result Value Ref Range Status   Specimen Description   Final    BLOOD BLOOD RIGHT HAND Performed at [facility], [address], [place], [place] [address]    Special Requests   Final    BOTTLES DRAWN AEROBIC ONLY Blood Culture adequate volume Performed at [facility], [address] [place], [place] [address]    Culture  Setup Time   Final    AEROBIC BOTTLE ONLY GRAM POSITIVE COCCI Organism ID to follow CRITICAL RESULT CALLED TO, READ BACK BY AND VERIFIED WITH: L POINDEXTER PHARMD 01/10/20 0241 JDW    Culture (A)  Final    STAPHYLOCOCCUS EPIDERMIDIS STAPHYLOCOCCUS LUGDUNENSIS THE SIGNIFICANCE OF ISOLATING THIS ORGANISM FROM A SINGLE SET OF BLOOD CULTURES WHEN MULTIPLE SETS ARE DRAWN IS UNCERTAIN. PLEASE NOTIFY THE MICROBIOLOGY DEPARTMENT WITHIN ONE WEEK IF SPECIATION AND SENSITIVITIES ARE REQUIRED. Performed at [facility], [address] [place], [place] [address]    Report Status 01/12/2020 FINAL  Final  Blood Culture ID Panel (Reflexed)     Status: Abnormal   Collection Time: 01/09/20  6:13 AM  Result Value Ref Range Status   Enterococcus faecalis NOT DETECTED NOT DETECTED Final   Enterococcus Faecium NOT DETECTED NOT DETECTED Final   Listeria monocytogenes NOT DETECTED NOT DETECTED Final   Staphylococcus  species DETECTED (A) NOT DETECTED Final    Comment: CRITICAL RESULT CALLED TO, READ BACK BY AND VERIFIED WITH: L  POINDEXTER PHARMD 01/10/20 0241 JDW    Staphylococcus aureus (BCID) NOT DETECTED NOT DETECTED Final   Staphylococcus epidermidis DETECTED (A) NOT DETECTED Final    Comment: Methicillin (oxacillin) resistant coagulase negative staphylococcus. Possible blood culture contaminant (unless isolated from more than one blood culture draw or clinical case suggests pathogenicity). No antibiotic treatment is indicated for blood  culture contaminants. CRITICAL RESULT CALLED TO, READ BACK BY AND VERIFIED WITH: L POINDEXTER PHARMD 01/10/20 0241 JDW    Staphylococcus lugdunensis DETECTED (A) NOT DETECTED Final    Comment: Methicillin (oxacillin) resistant coagulase negative staphylococcus. Possible blood culture contaminant (unless isolated from more than one blood culture draw or clinical case suggests pathogenicity). No antibiotic treatment is indicated for blood  culture contaminants. CRITICAL RESULT CALLED TO, READ BACK BY AND VERIFIED WITH: L POINDEXTER PHARMD 01/10/20 0241 JDW    Streptococcus species NOT DETECTED NOT DETECTED Final   Streptococcus agalactiae NOT DETECTED NOT DETECTED Final   Streptococcus pneumoniae NOT DETECTED NOT DETECTED Final   Streptococcus pyogenes NOT DETECTED NOT DETECTED Final   A.calcoaceticus-baumannii NOT DETECTED NOT DETECTED Final   Bacteroides fragilis NOT DETECTED NOT DETECTED Final   Enterobacterales NOT DETECTED NOT DETECTED Final   Enterobacter cloacae complex NOT DETECTED NOT DETECTED Final   Escherichia coli NOT DETECTED NOT DETECTED Final   Klebsiella aerogenes NOT DETECTED NOT DETECTED Final   Klebsiella oxytoca NOT DETECTED NOT DETECTED Final   Klebsiella pneumoniae NOT DETECTED NOT DETECTED Final   Proteus species NOT DETECTED NOT DETECTED Final   Salmonella species NOT DETECTED NOT DETECTED Final   Serratia marcescens NOT DETECTED NOT DETECTED Final   Haemophilus influenzae NOT DETECTED NOT DETECTED Final   Neisseria meningitidis NOT DETECTED NOT  DETECTED Final   Pseudomonas aeruginosa NOT DETECTED NOT DETECTED Final   Stenotrophomonas maltophilia NOT DETECTED NOT DETECTED Final   Candida albicans NOT DETECTED NOT DETECTED Final   Candida auris NOT DETECTED NOT DETECTED Final   Candida glabrata NOT DETECTED NOT DETECTED Final   Candida krusei NOT DETECTED NOT DETECTED Final   Candida parapsilosis NOT DETECTED NOT DETECTED Final   Candida tropicalis NOT DETECTED NOT DETECTED Final   Cryptococcus neoformans/gattii NOT DETECTED NOT DETECTED Final   Methicillin resistance mecA/C DETECTED (A) NOT DETECTED Final    Comment: CRITICAL RESULT CALLED TO, READ BACK BY AND VERIFIED WITH: L POINDEXTER PHARMD 01/10/20 0241 JDW Performed at [facility], [address] [place], [place] [address]   Culture, blood (routine x 2)     Status: None (Preliminary result)   Collection Time: 01/12/20 11:43 AM   Specimen: BLOOD  Result Value Ref Range Status   Specimen Description   Final    BLOOD BLOOD LEFT ARM Performed at [facility], [address] [place], [place] [address]    Special Requests   Final    BOTTLES DRAWN AEROBIC ONLY Blood Culture adequate volume Performed at [facility], [address] [place], [place] [address]    Culture   Final    NO GROWTH 2 DAYS Performed at [facility], [address] [place], [place] [address]    Report Status PENDING  Incomplete  Culture, blood (routine x 2)     Status: None (Preliminary result)   Collection Time: 01/12/20 11:43 AM   Specimen: BLOOD  Result Value Ref Range Status  Specimen Description   Final    BLOOD BLOOD LEFT ARM Performed at [facility], [address] [place], [place] [address]    Special Requests   Final    BOTTLES DRAWN AEROBIC AND ANAEROBIC Blood Culture adequate volume Performed at [facility], [address] [place], [place] [address]    Culture   Final    NO GROWTH 2  DAYS Performed at [facility], [address], [place], [place] [address]    Report Status PENDING  Incomplete  SARS CORONAVIRUS 2 (TAT 6-24 HRS) Nasopharyngeal Nasopharyngeal Swab     Status: None   Collection Time: 01/13/20 12:12 PM   Specimen: Nasopharyngeal Swab  Result Value Ref Range Status   SARS Coronavirus 2 NEGATIVE NEGATIVE Final    Comment: (NOTE) SARS-CoV-2 target nucleic acids are NOT DETECTED.  The SARS-CoV-2 RNA is generally detectable in upper and lower respiratory specimens during the acute phase of infection. Negative results do not preclude SARS-CoV-2 infection, do not rule out co-infections with other pathogens, and should not be used as the sole basis for treatment or other patient management decisions. Negative results must be combined with clinical observations, patient history, and epidemiological information. The expected result is Negative.  Fact Sheet for Patients: HairSlick.no  Fact Sheet for Healthcare Providers: quierodirigir.com  This test is not yet approved or cleared by the [place] FDA and  has been authorized for detection and/or diagnosis of SARS-CoV-2 by FDA under an Emergency Use Authorization (EUA). This EUA will remain  in effect (meaning this test can be used) for the duration of the COVID-19 declaration under Se ction 564(b)(1) of the Act, 21 U.S.C. section 360bbb-3(b)(1), unless the authorization is terminated or revoked sooner.  Performed at [facility], [address], [place], [place] [address]          Radiology Studies: CT ABDOMEN PELVIS W CONTRAST  Result Date: 01/14/2020 CLINICAL DATA:  Acute abdominal pain. In patient with different abdominal pain than on admission. Recent EGD. EXAM: CT ABDOMEN AND PELVIS WITH CONTRAST TECHNIQUE: Multidetector CT imaging of the abdomen and pelvis was performed using the standard protocol following bolus  administration of intravenous contrast. CONTRAST:  OMNIPAQUE IOHEXOL 300 MG/ML  SOLN COMPARISON:  CT 01/08/2020. abdominal ultrasound 01/04/2020 FINDINGS: Lower chest: Minor subsegmental atelectasis in the left lung base. 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 Wall thickening of the distal esophagus with fluid level and mild adjacent fat stranding. Hepatobiliary: No focal hepatic abnormality. Slight decreased hepatic density suggesting steatosis. There is equivocal capsular nodularity. Gallstones on ultrasound are not well visualized by CT. No evidence of pericholecystic inflammation allowing for mild motion artifact. Pancreas: No ductal dilatation or inflammation. Spleen: Normal in size without focal abnormality. Adrenals/Urinary Tract: Stable adrenal thickening without dominant nodule. No hydronephrosis or perinephric edema. Homogeneous renal enhancement with symmetric excretion on delayed phase imaging. Small cortical cyst in the left kidney unchanged. Urinary bladder is partially distended with multiple small bladder diverticula again seen. Mild bladder wall thickening. Stomach/Bowel: Fluid within the distal esophagus with wall thickening and adjacent paraesophageal fat stranding. Small paraesophageal nodes are subcentimeter. There is no evidence of extraluminal air or perforation. Small hiatal hernia. Decompressed stomach. Duodenal diverticulum involving the third/fourth portion without inflammation. Small bowel is unremarkable. No obstruction or inflammatory change. The appendix is not confidently visualized, there is no evidence of appendicitis. Moderate volume of stool in the colon. Occasional colonic diverticula. No colonic wall  thickening or diverticulitis. Small volume of stool in the rectum. Vascular/Lymphatic: Aortic and branch atherosclerosis and iliac tortuosity. No aortic aneurysm. Patent portal vein. Retroaortic left renal vein. Small paraesophageal node adjacent to the distal  esophagus. There is a 10 mm portal caval node, likely reactive. No bulky abdominopelvic adenopathy. Reproductive: TURP defect in the prostate. Other: No free air, free fluid, or intra-abdominal fluid collection. Fat in the left inguinal canal. Musculoskeletal: Multilevel degenerative change in the lumbar spine. Bones appear under mineralized. There are no acute or suspicious osseous abnormalities. IMPRESSION: 1. Wall thickening of the distal esophagus with intraluminal fluid and mild adjacent fat stranding, suspicious for esophagitis. Recommend correlation with recent endoscopic findings. There is small paraesophageal lymph nodes and may be reactive but are nonspecific. 2. Mild hepatic steatosis. There is equivocal capsular nodularity, raising concern for cirrhosis. Recommend correlation with cirrhosis risk factors. 3. Chronic bladder wall thickening with multiple small bladder diverticula, similar to prior exam. 4. Colonic diverticulosis without diverticulitis. 5. Additional stable findings as described. Aortic Atherosclerosis (ICD10-I70.0). Electronically Signed   By: Narda Rutherford M.D.   On: 01/14/2020 16:38   ECHOCARDIOGRAM COMPLETE  Result Date: 01/13/2020    ECHOCARDIOGRAM REPORT   Patient Name:   [name] Date of Exam: 01/13/2020 Medical Rec #:  [id number]         Height:       72.0 in Accession #:    [id number]        Weight:       155.0 lb Date of Birth:  Jun 27, 1945        BSA:          1.912 m Patient Age:    73 years          BP:           151/85 mmHg Patient Gender: M                 HR:           93 bpm. Exam Location:  Inpatient Procedure: 2D Echo, Color Doppler and Cardiac Doppler Indications:    Bacteremia  History:        Patient has no prior history of Echocardiogram examinations.                 COPD; Risk Factors:Hypertension, Dyslipidemia and Polysubstance                 abuse.  Sonographer:    Irving Burton Senior RDCS Referring Phys: [id number] Monica Martinez [facility]  Sonographer Comments:  Technically difficult study due to poor echo windows. IMPRESSIONS  1. Left ventricular ejection fraction, by estimation, is 70 to 75%. The left ventricle has hyperdynamic function. The left ventricle has no regional wall motion abnormalities. There is severe left ventricular hypertrophy. Left ventricular diastolic parameters are consistent with Grade I diastolic dysfunction (impaired relaxation).  2. Right ventricular systolic function is normal. The right ventricular size is normal.  3. Left atrial size was moderately dilated.  4. The mitral valve is abnormal. Trivial mitral valve regurgitation.  5. The aortic valve is tricuspid. Aortic valve regurgitation is not visualized. Mild to moderate aortic valve sclerosis/calcification is present, without any evidence of aortic stenosis.  6. Aortic dilatation noted. There is mild to moderate dilatation at the level of the sinuses of Valsalva measuring 43 mm.  7. The inferior vena cava is normal in size with <50% respiratory variability, suggesting right atrial pressure of 8 mmHg. Conclusion(s)/Recommendation(s): No evidence of valvular  vegetations on this transthoracic echocardiogram. Would recommend a transesophageal echocardiogram to exclude infective endocarditis if clinically indicated. FINDINGS  Left Ventricle: Left ventricular ejection fraction, by estimation, is 70 to 75%. The left ventricle has hyperdynamic function. The left ventricle has no regional wall motion abnormalities. The left ventricular internal cavity size was normal in size. There is severe left ventricular hypertrophy. Left ventricular diastolic parameters are consistent with Grade I diastolic dysfunction (impaired relaxation). Indeterminate filling pressures. Right Ventricle: The right ventricular size is normal. No increase in right ventricular wall thickness. Right ventricular systolic function is normal. Left Atrium: Left atrial size was moderately dilated. Right Atrium: Right atrial size was  normal in size. Pericardium: There is no evidence of pericardial effusion. Mitral Valve: The mitral valve is abnormal. Moderate mitral annular calcification. Trivial mitral valve regurgitation. Tricuspid Valve: The tricuspid valve is grossly normal. Tricuspid valve regurgitation is trivial. Aortic Valve: The aortic valve is tricuspid. Aortic valve regurgitation is not visualized. Mild to moderate aortic valve sclerosis/calcification is present, without any evidence of aortic stenosis. Pulmonic Valve: The pulmonic valve was grossly normal. Pulmonic valve regurgitation is not visualized. Aorta: Aortic dilatation noted. There is mild to moderate dilatation at the level of the sinuses of Valsalva measuring 43 mm. Venous: The inferior vena cava is normal in size with less than 50% respiratory variability, suggesting right atrial pressure of 8 mmHg. IAS/Shunts: No atrial level shunt detected by color flow Doppler.  LEFT VENTRICLE PLAX 2D LVIDd:         3.60 cm  Diastology LVIDs:         2.10 cm  LV e' lateral:   5.82 cm/s LV PW:         1.50 cm  LV E/e' lateral: 11.3 LV IVS:        1.70 cm  LV e' medial:    4.88 cm/s LVOT diam:     2.30 cm  LV E/e' medial:  13.5 LV SV:         86 LV SV Index:   45 LVOT Area:     4.15 cm  RIGHT VENTRICLE RV S prime:     14.80 cm/s TAPSE (M-mode): 2.1 cm LEFT ATRIUM             Index       RIGHT ATRIUM           Index LA diam:        4.10 cm 2.14 cm/m  RA Area:     11.80 cm LA Vol (A2C):   66.1 ml 34.58 ml/m RA Volume:   24.20 ml  12.66 ml/m LA Vol (A4C):   84.5 ml 44.20 ml/m LA Biplane Vol: 75.3 ml 39.39 ml/m  AORTIC VALVE LVOT Vmax:   82.20 cm/s LVOT Vmean:  61.400 cm/s LVOT VTI:    0.208 m  AORTA Ao Root diam: 4.30 cm MITRAL VALVE MV Area (PHT): 2.32 cm    SHUNTS MV Decel Time: 327 msec    Systemic VTI:  0.21 m MV E velocity: 66.00 cm/s  Systemic Diam: 2.30 cm MV A velocity: 93.80 cm/s MV E/A ratio:  0.70 Zoila Shutter MD Electronically signed by Zoila Shutter MD Signature  Date/Time: 01/13/2020/1:55:16 PM    Final         Scheduled Meds: . amLODipine  5 mg Oral Daily  . buprenorphine-naloxone  1 tablet Sublingual Daily  . enoxaparin (LOVENOX) injection  40 mg Subcutaneous Q24H  . feeding supplement (KATE FARMS STANDARD 1.4)  325 mL Oral BID BM  . lidocaine  15 mL Oral Once  . mouth rinse  15 mL Mouth Rinse BID  . mometasone-formoterol  2 puff Inhalation BID  . multivitamin with minerals  1 tablet Oral Daily  . pantoprazole  40 mg Oral BID AC  . polyethylene glycol  17 g Oral BID  . potassium chloride  40 mEq Oral Daily  . senna-docusate  1 tablet Oral BID  . sucralfate  1 g Oral QID  . tamsulosin  0.4 mg Oral Daily   Continuous Infusions: . lactated ringers 20 mL/hr at 01/12/20 1640  . vancomycin 1,000 mg (01/14/20 1645)     LOS: 10 days     Briant Cedar, MD 01/14/2020, 5:06 PM

## 2020-01-14 NOTE — TOC Progression Note (Signed)
Transition of Care [facility]) - Progression Note    Patient Details  Name: [name] MRN: [id number] Date of Birth: 1945-07-25  Transition of Care [facility]) CM/SW Contact  Armanda Heritage, RN Phone Number: 01/14/2020, 11:23 AM  Clinical Narrative:    [facility] authorization received. Auth ID [id number] good from 01/14/20-01/16/20.    Expected Discharge Plan: Skilled Nursing Facility Barriers to Discharge: Continued Medical Work up  Expected Discharge Plan and Services Expected Discharge Plan: Skilled Nursing Facility In-house Referral: Clinical Social Work Discharge Planning Services: CM Consult Post Acute Care Choice: Skilled Nursing Facility Living arrangements for the past 2 months: Single Family Home                                       Social Determinants of Health (SDOH) Interventions    Readmission Risk Interventions Readmission Risk Prevention Plan 01/13/2020  Transportation Screening Complete  HRI or Home Care Consult Complete  Social Work Consult for Recovery Care Planning/Counseling Complete  Palliative Care Screening Not Applicable  Medication Review Oceanographer) Complete  Some recent data might be hidden

## 2020-01-14 NOTE — Progress Notes (Signed)
pt c/o 8/10 dull, constant abdominal pain that differs from the pain he had on admission. Passing gas and active bowel sounds. Abdomen is soft, but tender to touch. States he had BM yesterday. Tearful at times regarding pain. States Tylenol does not help his pain and is asking for something different. Dr. Sharolyn Douglas paged and made aware.

## 2020-01-14 NOTE — Plan of Care (Signed)

## 2020-01-14 NOTE — TOC Progression Note (Signed)
Transition of Care [facility]) - Progression Note    Patient Details  Name: [name] MRN: [id number] Date of Birth: Mar 26, 1946  Transition of Care [facility]) CM/SW Contact  Armanda Heritage, RN Phone Number: 01/14/2020, 1:35 PM  Clinical Narrative:    Cherlyn Roberts [phone]    Expected Discharge Plan: Skilled Nursing Facility Barriers to Discharge: Continued Medical Work up  Expected Discharge Plan and Services Expected Discharge Plan: Skilled Nursing Facility In-house Referral: Clinical Social Work Discharge Planning Services: CM Consult Post Acute Care Choice: Skilled Nursing Facility Living arrangements for the past 2 months: Single Family Home                                       Social Determinants of Health (SDOH) Interventions    Readmission Risk Interventions Readmission Risk Prevention Plan 01/13/2020  Transportation Screening Complete  HRI or Home Care Consult Complete  Social Work Consult for Recovery Care Planning/Counseling Complete  Palliative Care Screening Not Applicable  Medication Review Oceanographer) Complete  Some recent data might be hidden

## 2020-01-15 LAB — CBC WITH DIFFERENTIAL/PLATELET
Abs Immature Granulocytes: 0.01 10*3/uL (ref 0.00–0.07)
Basophils Absolute: 0 10*3/uL (ref 0.0–0.1)
Basophils Relative: 1 %
Eosinophils Absolute: 0 10*3/uL (ref 0.0–0.5)
Eosinophils Relative: 0 %
HCT: 27.1 % — ABNORMAL LOW (ref 39.0–52.0)
Hemoglobin: 8 g/dL — ABNORMAL LOW (ref 13.0–17.0)
Immature Granulocytes: 0 %
Lymphocytes Relative: 19 %
Lymphs Abs: 0.9 10*3/uL (ref 0.7–4.0)
MCH: 25.2 pg — ABNORMAL LOW (ref 26.0–34.0)
MCHC: 29.5 g/dL — ABNORMAL LOW (ref 30.0–36.0)
MCV: 85.5 fL (ref 80.0–100.0)
Monocytes Absolute: 0.9 10*3/uL (ref 0.1–1.0)
Monocytes Relative: 19 %
Neutro Abs: 2.9 10*3/uL (ref 1.7–7.7)
Neutrophils Relative %: 61 %
Platelets: 475 10*3/uL — ABNORMAL HIGH (ref 150–400)
RBC: 3.17 MIL/uL — ABNORMAL LOW (ref 4.22–5.81)
RDW: 19.8 % — ABNORMAL HIGH (ref 11.5–15.5)
WBC: 4.7 10*3/uL (ref 4.0–10.5)
nRBC: 0 % (ref 0.0–0.2)

## 2020-01-15 LAB — COMPREHENSIVE METABOLIC PANEL
ALT: 15 U/L (ref 0–44)
AST: 12 U/L — ABNORMAL LOW (ref 15–41)
Albumin: 2.5 g/dL — ABNORMAL LOW (ref 3.5–5.0)
Alkaline Phosphatase: 66 U/L (ref 38–126)
Anion gap: 9 (ref 5–15)
BUN: 7 mg/dL — ABNORMAL LOW (ref 8–23)
CO2: 17 mmol/L — ABNORMAL LOW (ref 22–32)
Calcium: 8.2 mg/dL — ABNORMAL LOW (ref 8.9–10.3)
Chloride: 105 mmol/L (ref 98–111)
Creatinine, Ser: 0.76 mg/dL (ref 0.61–1.24)
GFR calc Af Amer: >60 mL/min (ref >60)
GFR calc non Af Amer: >60 mL/min (ref >60)
Glucose, Bld: 120 mg/dL — ABNORMAL HIGH (ref 70–99)
Potassium: 4 mmol/L (ref 3.5–5.1)
Sodium: 131 mmol/L — ABNORMAL LOW (ref 135–145)
Total Bilirubin: 1 mg/dL (ref 0.3–1.2)
Total Protein: 6.1 g/dL — ABNORMAL LOW (ref 6.5–8.1)

## 2020-01-15 LAB — LIPASE, BLOOD: Lipase: 22 U/L (ref 11–51)

## 2020-01-15 NOTE — Progress Notes (Signed)
PROGRESS NOTE    [name]  WVP:[id number] DOB: August 17, 1945 DOA: 01/04/2020 PCP: [name], MD  Brief Narrative:  74 year old black male community dwelling Prior polysubstance abuse on Suboxone in the past, COPD, BPH, reflux, HTN, bipolar Admit 01/04/2020 nausea vomiting diarrhea subjective fever and melanotic-like stool  -this is in setting of Goody powders aspirin multi antihypertensives  On admission potassium 2.5 BUN/creatinine 87/6.4 gap 26 WBC 2.2 hemoglobin 7   GI consulted on admission started on clear liquid diet kept n.p.o. after midnight EGD performed showing esophagitis hiatal hernia gastritis nonbleeding duodenal ulcer Repeat EGD 01/10/2020 s esophagitis still present but duodenal ulcers were healing  During hospital stay had an episode of seizures and EEG was negative--- found to have sepsis secondary also to staph epidermidis/leuk 2/9 is?  Contaminant TTE 01/13/2020 - for endocarditis-ID discuss plan of care Currently awaiting HIDA scan secondary to abdominal pain  Assessment & Plan:   Active Problems:   Gastrointestinal hemorrhage   Hypokalemia   Leukocytosis   Acute esophagitis   Malnutrition of moderate degree    1. Melena secondary to upper GI bleed 2. ?  Ongoing concern of ischemia when upper endoscopy performed initially-negative H. Pylori/malignancy a. Abdominal pain seems controlled-continuing Protonix 40 twice daily for at least 8 weeks and Carafate 1 g 4 times daily for a month b. We will monitor the trends of dyscontrol of the next today today and a half c. I do not think he needs a HIDA scan but if this is necessary then we will schedule or try to coordinate this as an outpatient d. He is tolerating some p.o. intake but he is expected to have some pain given e. His esophagitis being still present on last EGD 8/20 3. Hyponatremia with metabolic acidosis a. Monitor trends with labs in the morning b. Probably just artifactual c.  4. ?   Sepsis 2/2 staph epidermidis lugdunensis  a. Rx vancomycin 8/19 through 8/24 b. Blood culture 8/18, 8/23 NGTD-prior physician D/W Dr. Shela Leff ER of ID and TTE was negative therefore we discontinued the same 5. Seizure disorder? a. Continue at this time 6. Recurrent abdominal pain a. See above discussion 7. Acute blood loss anemia status post transfusion 8/17 2 units PRBC a. Secondary to melena b. Blood count acceptable at 8.0 with transfusion threshold less than 7 for him 8. AKI on admission a. Likely prerenal azotemia secondary to severe volume depletion b. Now resolved discontinue IV fluids completely allow diet 9. Urinary retention with BPH a. Continue Flomax 0.4 10. Polysubstance abuse cocaine positive this admission-at baseline on Suboxone a. Stop taking Suboxone several months ago because of nausea with the same b. I have encouraged him to cease completely any polysubstance abuse or any opiates 11. BMI 21  DVT prophylaxis: Lovenox Code Status: Full Family Communication: None none Disposition:   Status is: Inpatient  Remains inpatient appropriate because:IV treatments appropriate due to intensity of illness or inability to take PO   Dispo: The patient is from: Home              Anticipated d/c is to: SNF              Anticipated d/c date is: 1 day              Patient currently is not medically stable to d/c.   Consultants:   Gastroenterology  Procedures: Scope  Antimicrobials: None currently awake alert coherent no distress EOMI NCAT    Subjective: Still some abdominal  pain but tolerating food to some degree No chest pain No fever  Objective: Vitals:   01/14/20 0915 01/14/20 1253 01/14/20 2129 01/15/20 0520  BP:  128/81 129/82 130/73  Pulse:  (!) 105 (!) 104 (!) 101  Resp:  18 18 18   Temp:  98.4 F (36.9 C) (!) 100.4 F (38 C) 98.8 F (37.1 C)  TempSrc:  Oral Oral Oral  SpO2: 97% 96% 98% 98%  Weight:      Height:        Intake/Output Summary (Last  24 hours) at 01/15/2020 01/17/2020 Last data filed at 01/15/2020 0600 Gross per 24 hour  Intake 1170 ml  Output 1375 ml  Net -205 ml   Filed Weights   01/06/20 1310 01/10/20 0306  Weight: 72.8 kg 70.3 kg    Examination:  General exam: EOMI NCAT no focal deficit Respiratory system: Clear no added sound no rales no rhonchi no adventitious sounds Cardiovascular system: S1-S2 no murmur rub or gallop-normal sinus rhythm on monitors  Gastrointestinal system:.soft nontender no rebound no guarding Central nervous system: Neurologically intact moving all 4 limbs equally Extremities: Soft no joint discomfort Skin: No lower extremity edema Psychiatry: Euthymic and congruent  Data Reviewed: I have personally reviewed following labs and imaging studies Sodium 131 CO2 17 BUNs/creatinine 7/0.7 Lipase 22 LFTs normal Hemoglobin 8.0 platelet 475 WBC 4.7   Radiology Studies: CT ABDOMEN PELVIS W CONTRAST  Result Date: 01/14/2020 CLINICAL DATA:  Acute abdominal pain. In patient with different abdominal pain than on admission. Recent EGD. EXAM: CT ABDOMEN AND PELVIS WITH CONTRAST TECHNIQUE: Multidetector CT imaging of the abdomen and pelvis was performed using the standard protocol following bolus administration of intravenous contrast. CONTRAST:  01/16/2020 OMNIPAQUE IOHEXOL 300 MG/ML  SOLN COMPARISON:  CT 01/08/2020. abdominal ultrasound 01/04/2020 FINDINGS: Lower chest: Minor subsegmental atelectasis in the left lung base. 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 Wall thickening of the distal esophagus with fluid level and mild adjacent fat stranding. Hepatobiliary: No focal hepatic abnormality. Slight decreased hepatic density suggesting steatosis. There is equivocal capsular nodularity. Gallstones on ultrasound are not well visualized by CT. No evidence of pericholecystic inflammation allowing for mild motion artifact. Pancreas: No ductal dilatation or inflammation. Spleen: Normal in size without  focal abnormality. Adrenals/Urinary Tract: Stable adrenal thickening without dominant nodule. No hydronephrosis or perinephric edema. Homogeneous renal enhancement with symmetric excretion on delayed phase imaging. Small cortical cyst in the left kidney unchanged. Urinary bladder is partially distended with multiple small bladder diverticula again seen. Mild bladder wall thickening. Stomach/Bowel: Fluid within the distal esophagus with wall thickening and adjacent paraesophageal fat stranding. Small paraesophageal nodes are subcentimeter. There is no evidence of extraluminal air or perforation. Small hiatal hernia. Decompressed stomach. Duodenal diverticulum involving the third/fourth portion without inflammation. Small bowel is unremarkable. No obstruction or inflammatory change. The appendix is not confidently visualized, there is no evidence of appendicitis. Moderate volume of stool in the colon. Occasional colonic diverticula. No colonic wall thickening or diverticulitis. Small volume of stool in the rectum. Vascular/Lymphatic: Aortic and branch atherosclerosis and iliac tortuosity. No aortic aneurysm. Patent portal vein. Retroaortic left renal vein. Small paraesophageal node adjacent to the distal esophagus. There is a 10 mm portal caval node, likely reactive. No bulky abdominopelvic adenopathy. Reproductive: TURP defect in the prostate. Other: No free air, free fluid, or intra-abdominal fluid collection. Fat in the left inguinal canal. Musculoskeletal: Multilevel degenerative change in the lumbar spine. Bones appear under mineralized. There are no acute or  suspicious osseous abnormalities. IMPRESSION: 1. Wall thickening of the distal esophagus with intraluminal fluid and mild adjacent fat stranding, suspicious for esophagitis. Recommend correlation with recent endoscopic findings. There is small paraesophageal lymph nodes and may be reactive but are nonspecific. 2. Mild hepatic steatosis. There is equivocal  capsular nodularity, raising concern for cirrhosis. Recommend correlation with cirrhosis risk factors. 3. Chronic bladder wall thickening with multiple small bladder diverticula, similar to prior exam. 4. Colonic diverticulosis without diverticulitis. 5. Additional stable findings as described. Aortic Atherosclerosis (ICD10-I70.0). Electronically Signed   By: Narda RutherfordMelanie  Sanford M.D.   On: 01/14/2020 16:38   ECHOCARDIOGRAM COMPLETE  Result Date: 01/13/2020    ECHOCARDIOGRAM REPORT   Patient Name:   Alan LewandowskyATHANIEL Massenburg Date of Exam: 01/13/2020 Medical Rec #:  [id number]         Height:       72.0 in Accession #:    [id number]        Weight:       155.0 lb Date of Birth:  11/23/1945        BSA:          1.912 m Patient Age:    73 years          BP:           151/85 mmHg Patient Gender: M                 HR:           93 bpm. Exam Location:  Inpatient Procedure: 2D Echo, Color Doppler and Cardiac Doppler Indications:    Bacteremia  History:        Patient has no prior history of Echocardiogram examinations.                 COPD; Risk Factors:Hypertension, Dyslipidemia and Polysubstance                 abuse.  Sonographer:    Irving BurtonEmily Senior RDCS Referring Phys: [id number] Monica MartinezKEIRUKA J [facility] EastEZENDUKA  Sonographer Comments: Technically difficult study due to poor echo windows. IMPRESSIONS  1. Left ventricular ejection fraction, by estimation, is 70 to 75%. The left ventricle has hyperdynamic function. The left ventricle has no regional wall motion abnormalities. There is severe left ventricular hypertrophy. Left ventricular diastolic parameters are consistent with Grade I diastolic dysfunction (impaired relaxation).  2. Right ventricular systolic function is normal. The right ventricular size is normal.  3. Left atrial size was moderately dilated.  4. The mitral valve is abnormal. Trivial mitral valve regurgitation.  5. The aortic valve is tricuspid. Aortic valve regurgitation is not visualized. Mild to moderate aortic valve  sclerosis/calcification is present, without any evidence of aortic stenosis.  6. Aortic dilatation noted. There is mild to moderate dilatation at the level of the sinuses of Valsalva measuring 43 mm.  7. The inferior vena cava is normal in size with <50% respiratory variability, suggesting right atrial pressure of 8 mmHg. Conclusion(s)/Recommendation(s): No evidence of valvular vegetations on this transthoracic echocardiogram. Would recommend a transesophageal echocardiogram to exclude infective endocarditis if clinically indicated. FINDINGS  Left Ventricle: Left ventricular ejection fraction, by estimation, is 70 to 75%. The left ventricle has hyperdynamic function. The left ventricle has no regional wall motion abnormalities. The left ventricular internal cavity size was normal in size. There is severe left ventricular hypertrophy. Left ventricular diastolic parameters are consistent with Grade I diastolic dysfunction (impaired relaxation). Indeterminate filling pressures. Right Ventricle: The right ventricular size is normal. No increase in  right ventricular wall thickness. Right ventricular systolic function is normal. Left Atrium: Left atrial size was moderately dilated. Right Atrium: Right atrial size was normal in size. Pericardium: There is no evidence of pericardial effusion. Mitral Valve: The mitral valve is abnormal. Moderate mitral annular calcification. Trivial mitral valve regurgitation. Tricuspid Valve: The tricuspid valve is grossly normal. Tricuspid valve regurgitation is trivial. Aortic Valve: The aortic valve is tricuspid. Aortic valve regurgitation is not visualized. Mild to moderate aortic valve sclerosis/calcification is present, without any evidence of aortic stenosis. Pulmonic Valve: The pulmonic valve was grossly normal. Pulmonic valve regurgitation is not visualized. Aorta: Aortic dilatation noted. There is mild to moderate dilatation at the level of the sinuses of Valsalva measuring 43 mm.  Venous: The inferior vena cava is normal in size with less than 50% respiratory variability, suggesting right atrial pressure of 8 mmHg. IAS/Shunts: No atrial level shunt detected by color flow Doppler.  LEFT VENTRICLE PLAX 2D LVIDd:         3.60 cm  Diastology LVIDs:         2.10 cm  LV e' lateral:   5.82 cm/s LV PW:         1.50 cm  LV E/e' lateral: 11.3 LV IVS:        1.70 cm  LV e' medial:    4.88 cm/s LVOT diam:     2.30 cm  LV E/e' medial:  13.5 LV SV:         86 LV SV Index:   45 LVOT Area:     4.15 cm  RIGHT VENTRICLE RV S prime:     14.80 cm/s TAPSE (M-mode): 2.1 cm LEFT ATRIUM             Index       RIGHT ATRIUM           Index LA diam:        4.10 cm 2.14 cm/m  RA Area:     11.80 cm LA Vol (A2C):   66.1 ml 34.58 ml/m RA Volume:   24.20 ml  12.66 ml/m LA Vol (A4C):   84.5 ml 44.20 ml/m LA Biplane Vol: 75.3 ml 39.39 ml/m  AORTIC VALVE LVOT Vmax:   82.20 cm/s LVOT Vmean:  61.400 cm/s LVOT VTI:    0.208 m  AORTA Ao Root diam: 4.30 cm MITRAL VALVE MV Area (PHT): 2.32 cm    SHUNTS MV Decel Time: 327 msec    Systemic VTI:  0.21 m MV E velocity: 66.00 cm/s  Systemic Diam: 2.30 cm MV A velocity: 93.80 cm/s MV E/A ratio:  0.70 Zoila Shutter MD Electronically signed by Zoila Shutter MD Signature Date/Time: 01/13/2020/1:55:16 PM    Final      Scheduled Meds:  amLODipine  5 mg Oral Daily   buprenorphine-naloxone  1 tablet Sublingual Daily   enoxaparin (LOVENOX) injection  40 mg Subcutaneous Q24H   feeding supplement (KATE FARMS STANDARD 1.4)  325 mL Oral BID BM   lidocaine  15 mL Oral Once   mouth rinse  15 mL Mouth Rinse BID   mometasone-formoterol  2 puff Inhalation BID   multivitamin with minerals  1 tablet Oral Daily   pantoprazole  40 mg Oral BID AC   polyethylene glycol  17 g Oral BID   potassium chloride  40 mEq Oral Daily   senna-docusate  1 tablet Oral BID   sucralfate  1 g Oral QID   tamsulosin  0.4 mg Oral Daily  Continuous Infusions:  lactated ringers 20 mL/hr  at 01/12/20 1640     LOS: 11 days    Time spent: 35  Rhetta Mura, MD [facility] To contact the attending provider between 7A-7P or the covering provider during after hours 7P-7A, please log into the web site [email or web address] and access using universal [place] password for that web site. If you do not have the password, please call the hospital operator.  01/15/2020, 7:21 AM

## 2020-01-15 NOTE — Progress Notes (Signed)
Physical Therapy Treatment Patient Details Name: [name] MRN: [id number] DOB: 12-16-1945 Today's Date: 01/15/2020    History of Present Illness [name] is a 74 y.o. male with medical history significant of peptic ulcer disease admitted with nausea vomiting and black stools. Upper GI bleed EGD shows esophagitis with bleeding which was biopsied.  Hiatal hernia and gastritis.  Nonbleeding duodenal ulcer    PT Comments    Assisted OOB to amb an increased distance.  Unsteady gait with narrow BOS.  Pt will need ST Rehab at SNF   Follow Up Recommendations  SNF     Equipment Recommendations  None recommended by PT    Recommendations for Other Services       Precautions / Restrictions Precautions Precautions: Fall Restrictions Weight Bearing Restrictions: No    Mobility  Bed Mobility Overal bed mobility: Needs Assistance Bed Mobility: Supine to Sit     Supine to sit: Supervision     General bed mobility comments: increased time, assist for lines only  Transfers Overall transfer level: Needs assistance Equipment used: Rolling walker (2 wheeled) Transfers: Sit to/from [employer] Sit to Stand: Supervision Stand pivot transfers: Supervision;Min guard       General transfer comment: safety  Ambulation/Gait                 Stairs             Wheelchair Mobility    Modified Rankin (Stroke Patients Only)       Balance                                            Cognition Arousal/Alertness: Awake/alert Behavior During Therapy: WFL for tasks assessed/performed Overall Cognitive Status: Within Functional Limits for tasks assessed                                 General Comments: AxO x 3 pleasant      Exercises      General Comments        Pertinent Vitals/Pain Pain Assessment: No/denies pain    Home Living                      Prior Function            PT  Goals (current goals can now be found in the care plan section) Progress towards PT goals: Progressing toward goals    Frequency    Min 3X/week      PT Plan Current plan remains appropriate    Co-evaluation              AM-PAC PT "6 Clicks" Mobility   Outcome Measure  Help needed turning from your back to your side while in a flat bed without using bedrails?: A Little Help needed moving from lying on your back to sitting on the side of a flat bed without using bedrails?: A Little Help needed moving to and from a bed to a chair (including a wheelchair)?: A Little Help needed standing up from a chair using your arms (e.g., wheelchair or bedside chair)?: A Little Help needed to walk in hospital room?: A Little Help needed climbing 3-5 steps with a railing? : A Lot 6 Click Score: 17    End of Session Equipment Utilized During  Treatment: Gait belt Activity Tolerance: Patient tolerated treatment well Patient left: in chair;with call bell/phone within reach;with chair alarm set Nurse Communication: Mobility status PT Visit Diagnosis: Difficulty in walking, not elsewhere classified (R26.2);Unsteadiness on feet (R26.81)     Time: 4656-8127 PT Time Calculation (min) (ACUTE ONLY): 26 min  Charges:  $Gait Training: 8-22 mins $Therapeutic Activity: 8-22 mins                     Felecia Shelling  PTA Acute  Rehabilitation Services Pager      [phone] Office      [phone]

## 2020-01-15 NOTE — Plan of Care (Signed)
Problem: Education: Goal: Knowledge of General Education information will improve Description: Including pain rating scale, medication(s)/side effects and non-pharmacologic comfort measures Outcome: Completed/Met   Problem: Health Behavior/Discharge Planning: Goal: Ability to manage health-related needs will improve Outcome: Progressing   Problem: Clinical Measurements: Goal: Ability to maintain clinical measurements within normal limits will improve Outcome: Completed/Met Goal: Will remain free from infection Outcome: Progressing Goal: Diagnostic test results will improve Outcome: Progressing Goal: Respiratory complications will improve Outcome: Completed/Met Goal: Cardiovascular complication will be avoided Outcome: Completed/Met   Problem: Activity: Goal: Risk for activity intolerance will decrease Outcome: Progressing   Problem: Nutrition: Goal: Adequate nutrition will be maintained Outcome: Progressing   Problem: Coping: Goal: Level of anxiety will decrease Outcome: Progressing   Problem: Elimination: Goal: Will not experience complications related to bowel motility Outcome: Completed/Met Goal: Will not experience complications related to urinary retention Outcome: Completed/Met   Problem: Pain Managment: Goal: General experience of comfort will improve Outcome: Progressing   Problem: Safety: Goal: Ability to remain free from injury will improve Outcome: Progressing   Problem: Skin Integrity: Goal: Risk for impaired skin integrity will decrease Outcome: Completed/Met

## 2020-01-15 NOTE — [place] FL2 (Signed)
[place] MEDICAID FL2 LEVEL OF CARE SCREENING TOOL     IDENTIFICATION  Patient Name: [name] Birthdate: Mar 01, 1946 Sex: male Admission Date (Current Location): 01/04/2020  [facility] and [place] Number:  Producer, television/film/video and Address:  [facility],  [address], [place] [address]      Provider Number: [id number]  Attending Physician Name and Address:  Rhetta Mura, MD  Relative Name and Phone Number:  Berthold Glace [phone], brother    Current Level of Care: Hospital Recommended Level of Care: Skilled Nursing Facility Prior Approval Number:    Date Approved/Denied:   PASRR Number: [id number]  Discharge Plan: SNF    Current Diagnoses: Patient Active Problem List   Diagnosis Date Noted  . Malnutrition of moderate degree 01/06/2020  . Acute esophagitis   . Gastrointestinal hemorrhage 01/04/2020  . Hypokalemia 01/04/2020  . Leukocytosis 01/04/2020  . Frontal headache   . Hypertension 11/08/2016  . COPD (chronic obstructive pulmonary disease) (HCC) 11/08/2016  . GERD (gastroesophageal reflux disease) 11/08/2016  . Acute kidney injury (HCC) 11/08/2016  . BPH (benign prostatic hyperplasia) 11/08/2016  . Polysubstance dependence (HCC) 11/08/2016  . Hip pain, bilateral 09/17/2014  . AKI (acute kidney injury) (HCC) 09/10/2014  . Orthostatic hypotension 09/10/2014  . Nausea and vomiting 09/10/2014  . Polysubstance abuse (HCC) 09/10/2014  . Syncope 09/09/2014  . Peptic ulcer disease   . Benign prostatic hypertrophy (BPH) with incomplete bladder emptying 03/17/2014  . CHEST PAIN, RIGHT 05/23/2007  . Essential hypertension 04/05/2007  . NON-HEALING SURGICAL WOUND NEC 03/19/2007  . METHICILLIN-RESISTANT STAPHYLOCOCCUS AUREUS PNEUMONIA 03/15/2007  . EMPYEMA 03/15/2007    Orientation RESPIRATION BLADDER Height & Weight     Self, Place  Normal Continent Weight: 70.3 kg Height:  6' (182.9 cm)  BEHAVIORAL SYMPTOMS/MOOD  NEUROLOGICAL BOWEL NUTRITION STATUS      Continent Diet  AMBULATORY STATUS COMMUNICATION OF NEEDS Skin   Extensive Assist Verbally Normal                       Personal Care Assistance Level of Assistance  Bathing, Feeding, Dressing Bathing Assistance: Maximum assistance Feeding assistance: Limited assistance Dressing Assistance: Maximum assistance     Functional Limitations Info             SPECIAL CARE FACTORS FREQUENCY                       Contractures Contractures Info: Not present    Additional Factors Info                  Current Medications (01/15/2020):  This is the current hospital active medication list Current Facility-Administered Medications  Medication Dose Route Frequency Provider Last Rate Last Admin  . acetaminophen (TYLENOL) tablet 650 mg  650 mg Oral Q6H PRN Briant Cedar, MD   650 mg at 01/13/20 1825  . albuterol (PROVENTIL) (2.5 MG/3ML) 0.083% nebulizer solution 2.5 mg  2.5 mg Nebulization Q6H PRN Rachael Fee, MD      . alum & mag hydroxide-simeth (MAALOX/MYLANTA) 200-200-20 MG/5ML suspension 30 mL  30 mL Oral Q4H PRN Rachael Fee, MD   30 mL at 01/09/20 1657   And  . lidocaine (XYLOCAINE) 2 % viscous mouth solution 15 mL  15 mL Oral Once Rachael Fee, MD      . amLODipine (NORVASC) tablet 5 mg  5 mg Oral Daily Rachael Fee, MD   5 mg at  01/15/20 1037  . buprenorphine-naloxone (SUBOXONE) 8-2 mg per SL tablet 1 tablet  1 tablet Sublingual Daily Rachael Fee, MD   1 tablet at 01/11/20 0932  . enoxaparin (LOVENOX) injection 40 mg  40 mg Subcutaneous Q24H Briant Cedar, MD   40 mg at 01/14/20 1639  . feeding supplement (KATE FARMS STANDARD 1.4) liquid 325 mL  325 mL Oral BID BM Rachael Fee, MD   325 mL at 01/15/20 1037  . lactated ringers infusion   Intravenous Continuous Rachael Fee, MD 20 mL/hr at 01/12/20 1640 New Bag at 01/12/20 1640  . MEDLINE mouth rinse  15 mL Mouth Rinse BID Rachael Fee, MD   15 mL at 01/14/20 2133  . melatonin tablet 6 mg  6 mg Oral QHS PRN Rachael Fee, MD   6 mg at 01/10/20 2132  . methocarbamol (ROBAXIN) tablet 750 mg  750 mg Oral Q8H PRN Briant Cedar, MD   750 mg at 01/15/20 1036  . mometasone-formoterol (DULERA) 200-5 MCG/ACT inhaler 2 puff  2 puff Inhalation BID Rachael Fee, MD   2 puff at 01/15/20 0277  . multivitamin with minerals tablet 1 tablet  1 tablet Oral Daily Rachael Fee, MD   1 tablet at 01/15/20 1037  . ondansetron (ZOFRAN) injection 4 mg  4 mg Intravenous Q6H PRN Rachael Fee, MD   4 mg at 01/10/20 1535  . pantoprazole (PROTONIX) EC tablet 40 mg  40 mg Oral BID AC Rachael Fee, MD   40 mg at 01/15/20 1037  . phenol (CHLORASEPTIC) mouth spray 1 spray  1 spray Mouth/Throat PRN Rachael Fee, MD      . polyethylene glycol (MIRALAX / GLYCOLAX) packet 17 g  17 g Oral BID Rachael Fee, MD   17 g at 01/14/20 2132  . potassium chloride SA (KLOR-CON) CR tablet 40 mEq  40 mEq Oral Daily Rachael Fee, MD   40 mEq at 01/15/20 1037  . senna-docusate (Senokot-S) tablet 1 tablet  1 tablet Oral BID Rachael Fee, MD   1 tablet at 01/14/20 2131  . sucralfate (CARAFATE) 1 GM/10ML suspension 1 g  1 g Oral QID Rachael Fee, MD   1 g at 01/15/20 1328  . tamsulosin (FLOMAX) capsule 0.4 mg  0.4 mg Oral Daily Rachael Fee, MD   0.4 mg at 01/15/20 1037     Discharge Medications: Please see discharge summary for a list of discharge medications.  Relevant Imaging Results:  Relevant Lab Results:   Additional Information SS# [id number]  Lanier Clam, RN

## 2020-01-16 LAB — CBC WITH DIFFERENTIAL/PLATELET
Abs Immature Granulocytes: 0.02 10*3/uL (ref 0.00–0.07)
Basophils Absolute: 0 10*3/uL (ref 0.0–0.1)
Basophils Relative: 0 %
Eosinophils Absolute: 0 10*3/uL (ref 0.0–0.5)
Eosinophils Relative: 0 %
HCT: 27.8 % — ABNORMAL LOW (ref 39.0–52.0)
Hemoglobin: 8.5 g/dL — ABNORMAL LOW (ref 13.0–17.0)
Immature Granulocytes: 0 %
Lymphocytes Relative: 16 %
Lymphs Abs: 0.8 10*3/uL (ref 0.7–4.0)
MCH: 25.6 pg — ABNORMAL LOW (ref 26.0–34.0)
MCHC: 30.6 g/dL (ref 30.0–36.0)
MCV: 83.7 fL (ref 80.0–100.0)
Monocytes Absolute: 1.1 10*3/uL — ABNORMAL HIGH (ref 0.1–1.0)
Monocytes Relative: 22 %
Neutro Abs: 3 10*3/uL (ref 1.7–7.7)
Neutrophils Relative %: 62 %
Platelets: 484 10*3/uL — ABNORMAL HIGH (ref 150–400)
RBC: 3.32 MIL/uL — ABNORMAL LOW (ref 4.22–5.81)
RDW: 19.5 % — ABNORMAL HIGH (ref 11.5–15.5)
WBC: 4.8 10*3/uL (ref 4.0–10.5)
nRBC: 0 % (ref 0.0–0.2)

## 2020-01-16 LAB — COMPREHENSIVE METABOLIC PANEL
ALT: 14 U/L (ref 0–44)
AST: 12 U/L — ABNORMAL LOW (ref 15–41)
Albumin: 2.6 g/dL — ABNORMAL LOW (ref 3.5–5.0)
Alkaline Phosphatase: 70 U/L (ref 38–126)
Anion gap: 11 (ref 5–15)
BUN: 10 mg/dL (ref 8–23)
CO2: 18 mmol/L — ABNORMAL LOW (ref 22–32)
Calcium: 8.4 mg/dL — ABNORMAL LOW (ref 8.9–10.3)
Chloride: 105 mmol/L (ref 98–111)
Creatinine, Ser: 0.68 mg/dL (ref 0.61–1.24)
GFR calc Af Amer: >60 mL/min (ref >60)
GFR calc non Af Amer: >60 mL/min (ref >60)
Glucose, Bld: 107 mg/dL — ABNORMAL HIGH (ref 70–99)
Potassium: 4.4 mmol/L (ref 3.5–5.1)
Sodium: 134 mmol/L — ABNORMAL LOW (ref 135–145)
Total Bilirubin: 0.4 mg/dL (ref 0.3–1.2)
Total Protein: 6.4 g/dL — ABNORMAL LOW (ref 6.5–8.1)

## 2020-01-16 NOTE — Progress Notes (Signed)
PROGRESs NOTE    [name]  YCX:[id number] DOB: [birth year] DOA: 01/04/2020 PCP: [name], MD  Brief Narrative:  74 year old black male community dwelling Prior polysubstance abuse on Suboxone in the past, COPD, BPH, reflux, HTN, bipolar Admit 01/04/2020 nausea vomiting diarrhea subjective fever and melanotic-like stool  -this is in setting of Goody powders aspirin multi antihypertensives  On admission potassium 2.5 BUN/creatinine 87/6.4 gap 26 WBC 2.2 hemoglobin 7   GI consulted on admission started on clear liquid diet kept n.p.o. after midnight EGD performed showing esophagitis hiatal hernia gastritis nonbleeding duodenal ulcer Repeat EGD 01/10/2020 s esophagitis still present but duodenal ulcers were healing  During hospital stay had an episode of seizures and EEG was negative--- found to have sepsis secondary also to staph epidermidis/leuk 2/9 is?  Contaminant TTE 01/13/2020 - for endocarditis-ID discuss plan of care Currently awaiting HIDA scan secondary to abdominal pain  Assessment & Plan:   Active Problems:   Gastrointestinal hemorrhage   Hypokalemia   Leukocytosis   Acute esophagitis   Malnutrition of moderate degree    1. Melena secondary to upper GI bleed 2. ?  Ongoing concern of ischemia when upper endoscopy performed initially-negative H. Pylori/malignancy a. Abdominal pain seems controlled-continuing Protonix 40 twice daily for at least 8 weeks and Carafate 1 g 4 times daily for a month b.  monitor the trends of control of the next today today and a half c. tolerating some p.o. pain given esophagitis being still present on last EGD 8/20 3. Hyponatremia with metabolic acidosis a. Monitor trends with labs in the morning b. Probably just artifactual 4. ?  Sepsis 2/2 staph epidermidis lugdunensis  a. Rx vancomycin 8/19 through 8/24 b. Blood culture 8/18, 8/23 NGTD-prior physician D/w dr comer OD and TTE was negative therefore we discontinued the  same c. Patient still has low-grade temps in the 100 range on several nights I have asked nursing to ensure that he is not significantly covered in blankets overnight d. If he has no further temperatures we will probably be able to discharge him tomorrow otherwise may need to reconsult ID 5. Seizure disorder? a. Continue at this time 6. Recurrent abdominal pain a. See above discussion 7. Acute blood loss anemia status post transfusion 8/17 2 units PRBC a. Secondary to melena b. Blood count acceptable-transfusion threshold less than 7 for him 8. AKI on admission a. Likely prerenal azotemia secondary to severe volume depletion b. Now resolved discontinue IV fluids completely allow diet 9. Urinary retention with BPH a. Continue Flomax 0.4 10. Polysubstance abuse cocaine positive this admission-at baseline on Suboxone a. Stop taking Suboxone several months ago because of nausea with the same b. I have encouraged him to cease completely any polysubstance abuse or any opiates 11. BMI 21  DVT prophylaxis: Lovenox Code Status: Full Family Communication: None none Disposition:   Status is: Inpatient  Remains inpatient appropriate because:IV treatments appropriate due to intensity of illness or inability to take PO   Dispo: The patient is from: Home              Anticipated d/c is to: SNF              Anticipated d/c date is: 1 day              Patient currently is not medically stable to d/c.   Consultants:   Gastroenterology  Procedures: Scope  Antimicrobials:    Subjective: Awake coherent in nad no focal deficit Eating and drinking  No cp Note fever overnight  Objective: Vitals:   01/16/20 0829 01/16/20 0900 01/16/20 0927 01/16/20 1310  BP:   116/84 124/82  Pulse:    93  Resp:  (!) 21 19 18   Temp:    98.6 F (37 C)  TempSrc:    Oral  SpO2: 98%   99%  Weight:      Height:        Intake/Output Summary (Last 24 hours) at 01/16/2020 1623 Last data filed at  01/16/2020 1144 Gross per 24 hour  Intake 520 ml  Output 600 ml  Net -80 ml   Filed Weights   01/06/20 1310 01/10/20 0306  Weight: 72.8 kg 70.3 kg    Examination:  General exam: EOMI NCAT no focal deficit Respiratory system: Clear no added sound no rales no rhonchi no adventitious sounds Cardiovascular system: S1-S2 no murmur rub Gastrointestinal system:.soft nontender no rebound no guarding Central nervous system: Neurologically intact moving all 4 limbs equally Extremities: Soft no joint discomfort Skin: No lower extremity edema Psychiatry: Euthymic and congruent  Data Reviewed: I have personally reviewed following labs and imaging studies Sodium 134 CO2 18 BUNs/creatinine 10/0.6  Hemoglobin 8.5    Radiology Studies: No results found.   Scheduled Meds: . amLODipine  5 mg Oral Daily  . buprenorphine-naloxone  1 tablet Sublingual Daily  . enoxaparin (LOVENOX) injection  40 mg Subcutaneous Q24H  . feeding supplement (KATE FARMS STANDARD 1.4)  325 mL Oral BID BM  . lidocaine  15 mL Oral Once  . mouth rinse  15 mL Mouth Rinse BID  . mometasone-formoterol  2 puff Inhalation BID  . multivitamin with minerals  1 tablet Oral Daily  . pantoprazole  40 mg Oral BID AC  . polyethylene glycol  17 g Oral BID  . potassium chloride  40 mEq Oral Daily  . senna-docusate  1 tablet Oral BID  . sucralfate  1 g Oral QID  . tamsulosin  0.4 mg Oral Daily   Continuous Infusions:    LOS: 12 days    Time spent: 25  01/12/20, MD [facility] To contact the attending provider between 7A-7P or the covering provider during after hours 7P-7A, please log into the web site [email or web address] and access using universal [place] password for that web site. If you do not have the password, please call the hospital operator.  01/16/2020, 4:23 PM

## 2020-01-16 NOTE — Care Management Important Message (Signed)
Important Message  Patient Details IM Letter given to the Patient Name: [name] MRN: [id number] Date of Birth: 1945-08-31   Medicare Important Message Given:  Yes     Caren Macadam 01/16/2020, 12:34 PM

## 2020-01-16 NOTE — Progress Notes (Signed)
Pt refused AM labs. Educated pt on importance of labs and pt states "I just don't feel like it." Dr. Katherina Right made aware.   Otniel Hoe,RN

## 2020-01-16 NOTE — Progress Notes (Signed)
Occupational Therapy Progress Note  Upon arrival patient had bowel movement in bed. Pt supervision for bed mobility, mod A for thoroughness with peri care and min hand held assist x1 to transfer to bedside commode. No further bowel movement, patient min hand held assist A for stability to ambulate approx 5 ft to recliner with poor eccentric control and limited activity tolerance. Patient set up for hand + face hygiene. D/C plan appropriate due to patient decreased activity tolerance, balance, safety awareness. Will continue to follow.    01/16/20 0900  OT Visit Information  Last OT Received On 01/16/20  Assistance Needed +1  History of Present Illness [name] is a 74 y.o. male with medical history significant of peptic ulcer disease admitted with nausea vomiting and black stools. Upper GI bleed EGD shows esophagitis with bleeding which was biopsied.  Hiatal hernia and gastritis.  Nonbleeding duodenal ulcer  Precautions  Precautions Fall  Pain Assessment  Pain Assessment No/denies pain  Cognition  Arousal/Alertness Awake/alert  Behavior During Therapy WFL for tasks assessed/performed  Overall Cognitive Status Within Functional Limits for tasks assessed  ADL  Overall ADL's  Needs assistance/impaired  Grooming Wash/dry face;Wash/dry hands;Set up;Sitting  Grooming Details (indicate cue type and reason) in recliner  Toilet Transfer Minimal assistance;Stand-pivot;Cueing for safety;BSC  Toilet Transfer Details (indicate cue type and reason) hand held assist x1 for stability patient mildly tremulous  Toileting- Clothing Manipulation and Hygiene Moderate assistance;Sit to/from stand  Toileting - Clothing Manipulation Details (indicate cue type and reason) for thoroughness, patient had bowel movement in bed. transferred to [facility] to clean bed linens, patient did not have further bowel movement however able to tolerate dynamic standing with min A to participate in peri care  Functional mobility  during ADLs Minimal assistance;Cueing for safety;Cueing for sequencing (hand held assist x1)  General ADL Comments patient with decreased activity tolerance, balance, safety awareness   Bed Mobility  Overal bed mobility Needs Assistance  Bed Mobility Supine to Sit  Supine to sit Supervision  Balance  Overall balance assessment Needs assistance  Sitting-balance support Feet supported  Sitting balance-Leahy Scale Fair  Standing balance support Single extremity supported  Standing balance-Leahy Scale Poor  Transfers  Overall transfer level Needs assistance  Equipment used 1 person hand held assist  Transfers Sit to/from Stand;Stand Pivot Transfers  Sit to Stand Min assist  Stand pivot transfers Min assist  General transfer comment for stability  OT - End of Session  Activity Tolerance Patient tolerated treatment well  Patient left in chair;with call bell/phone within reach;with chair alarm set  Nurse Communication Mobility status  OT Assessment/Plan  OT Plan Discharge plan remains appropriate  OT Visit Diagnosis Unsteadiness on feet (R26.81);Muscle weakness (generalized) (M62.81)  OT Frequency (ACUTE ONLY) Min 2X/week  Follow Up Recommendations SNF  OT Equipment Other (comment) (TBD)  AM-PAC OT "6 Clicks" Daily Activity Outcome Measure (Version 2)  Help from another person eating meals? 4  Help from another person taking care of personal grooming? 3  Help from another person toileting, which includes using toliet, bedpan, or urinal? 2  Help from another person bathing (including washing, rinsing, drying)? 2  Help from another person to put on and taking off regular upper body clothing? 3  Help from another person to put on and taking off regular lower body clothing? 2  6 Click Score 16  OT Goal Progression  Progress towards OT goals Progressing toward goals  Acute Rehab OT Goals  Patient Stated Goal eager to get  up and move  OT Goal Formulation With patient  Time For Goal  Achievement 01/24/20  Potential to Achieve Goals Good  ADL Goals  Pt Will Perform Lower Body Dressing with min guard assist;sit to/from stand;sitting/lateral leans  Pt Will Transfer to Toilet with min guard assist;ambulating;bedside commode (walker)  Pt Will Perform Toileting - Clothing Manipulation and hygiene with min assist;sitting/lateral leans;sit to/from stand  Additional ADL Goal #1 Patient will sequence 2 step task with min cues in order to safely perform self care tasks.  Additional ADL Goal #2 Patient will be min G assist with dynamic standing balance for 5 minutes in order to safely participate in self care.  OT Time Calculation  OT Start Time (ACUTE ONLY) [id number]  OT Stop Time (ACUTE ONLY) 9381  OT Time Calculation (min) 12 min  OT General Charges  $OT Visit 1 Visit  OT Treatments  $Self Care/Home Management  8-22 mins   Marlyce Huge OT OT pager: [phone]

## 2020-01-17 DIAGNOSIS — D72829 Elevated white blood cell count, unspecified: Secondary | ICD-10-CM | POA: Diagnosis not present

## 2020-01-17 DIAGNOSIS — Z20822 Contact with and (suspected) exposure to covid-19: Secondary | ICD-10-CM | POA: Diagnosis not present

## 2020-01-17 DIAGNOSIS — E46 Unspecified protein-calorie malnutrition: Secondary | ICD-10-CM | POA: Diagnosis not present

## 2020-01-17 DIAGNOSIS — R569 Unspecified convulsions: Secondary | ICD-10-CM | POA: Diagnosis not present

## 2020-01-17 DIAGNOSIS — R58 Hemorrhage, not elsewhere classified: Secondary | ICD-10-CM | POA: Diagnosis not present

## 2020-01-17 DIAGNOSIS — K209 Esophagitis, unspecified without bleeding: Secondary | ICD-10-CM | POA: Diagnosis not present

## 2020-01-17 DIAGNOSIS — K2091 Esophagitis, unspecified with bleeding: Secondary | ICD-10-CM | POA: Diagnosis not present

## 2020-01-17 DIAGNOSIS — K921 Melena: Secondary | ICD-10-CM | POA: Diagnosis not present

## 2020-01-17 DIAGNOSIS — M2550 Pain in unspecified joint: Secondary | ICD-10-CM | POA: Diagnosis not present

## 2020-01-17 DIAGNOSIS — Z7401 Bed confinement status: Secondary | ICD-10-CM | POA: Diagnosis not present

## 2020-01-17 DIAGNOSIS — D509 Iron deficiency anemia, unspecified: Secondary | ICD-10-CM | POA: Diagnosis not present

## 2020-01-17 DIAGNOSIS — R9431 Abnormal electrocardiogram [ECG] [EKG]: Secondary | ICD-10-CM | POA: Diagnosis not present

## 2020-01-17 DIAGNOSIS — Z7189 Other specified counseling: Secondary | ICD-10-CM | POA: Diagnosis not present

## 2020-01-17 DIAGNOSIS — K269 Duodenal ulcer, unspecified as acute or chronic, without hemorrhage or perforation: Secondary | ICD-10-CM | POA: Diagnosis not present

## 2020-01-17 DIAGNOSIS — F17200 Nicotine dependence, unspecified, uncomplicated: Secondary | ICD-10-CM | POA: Diagnosis not present

## 2020-01-17 DIAGNOSIS — K2970 Gastritis, unspecified, without bleeding: Secondary | ICD-10-CM | POA: Diagnosis not present

## 2020-01-17 DIAGNOSIS — K9289 Other specified diseases of the digestive system: Secondary | ICD-10-CM | POA: Diagnosis not present

## 2020-01-17 DIAGNOSIS — J984 Other disorders of lung: Secondary | ICD-10-CM | POA: Diagnosis not present

## 2020-01-17 DIAGNOSIS — I479 Paroxysmal tachycardia, unspecified: Secondary | ICD-10-CM | POA: Diagnosis not present

## 2020-01-17 DIAGNOSIS — E876 Hypokalemia: Secondary | ICD-10-CM | POA: Diagnosis not present

## 2020-01-17 DIAGNOSIS — R5381 Other malaise: Secondary | ICD-10-CM | POA: Diagnosis not present

## 2020-01-17 DIAGNOSIS — Z743 Need for continuous supervision: Secondary | ICD-10-CM | POA: Diagnosis not present

## 2020-01-17 DIAGNOSIS — K922 Gastrointestinal hemorrhage, unspecified: Secondary | ICD-10-CM | POA: Diagnosis not present

## 2020-01-17 LAB — CULTURE, BLOOD (ROUTINE X 2)
Culture: NO GROWTH
Culture: NO GROWTH
Special Requests: ADEQUATE
Special Requests: ADEQUATE

## 2020-01-17 LAB — CBC WITH DIFFERENTIAL/PLATELET
Abs Immature Granulocytes: 0.01 10*3/uL (ref 0.00–0.07)
Basophils Absolute: 0 10*3/uL (ref 0.0–0.1)
Basophils Relative: 1 %
Eosinophils Absolute: 0 10*3/uL (ref 0.0–0.5)
Eosinophils Relative: 1 %
HCT: 33.4 % — ABNORMAL LOW (ref 39.0–52.0)
Hemoglobin: 9.7 g/dL — ABNORMAL LOW (ref 13.0–17.0)
Immature Granulocytes: 0 %
Lymphocytes Relative: 27 %
Lymphs Abs: 1 10*3/uL (ref 0.7–4.0)
MCH: 24.9 pg — ABNORMAL LOW (ref 26.0–34.0)
MCHC: 29 g/dL — ABNORMAL LOW (ref 30.0–36.0)
MCV: 85.9 fL (ref 80.0–100.0)
Monocytes Absolute: 0.9 10*3/uL (ref 0.1–1.0)
Monocytes Relative: 26 %
Neutro Abs: 1.6 10*3/uL — ABNORMAL LOW (ref 1.7–7.7)
Neutrophils Relative %: 45 %
Platelets: 512 10*3/uL — ABNORMAL HIGH (ref 150–400)
RBC: 3.89 MIL/uL — ABNORMAL LOW (ref 4.22–5.81)
RDW: 19.8 % — ABNORMAL HIGH (ref 11.5–15.5)
WBC: 3.5 10*3/uL — ABNORMAL LOW (ref 4.0–10.5)
nRBC: 0 % (ref 0.0–0.2)

## 2020-01-17 LAB — SARS CORONAVIRUS 2 (TAT 6-24 HRS): SARS Coronavirus 2: NEGATIVE

## 2020-01-17 LAB — COMPREHENSIVE METABOLIC PANEL
ALT: 14 U/L (ref 0–44)
AST: 15 U/L (ref 15–41)
Albumin: 2.8 g/dL — ABNORMAL LOW (ref 3.5–5.0)
Alkaline Phosphatase: 75 U/L (ref 38–126)
Anion gap: 9 (ref 5–15)
BUN: 9 mg/dL (ref 8–23)
CO2: 20 mmol/L — ABNORMAL LOW (ref 22–32)
Calcium: 8.8 mg/dL — ABNORMAL LOW (ref 8.9–10.3)
Chloride: 107 mmol/L (ref 98–111)
Creatinine, Ser: 0.61 mg/dL (ref 0.61–1.24)
GFR calc Af Amer: >60 mL/min (ref >60)
GFR calc non Af Amer: >60 mL/min (ref >60)
Glucose, Bld: 97 mg/dL (ref 70–99)
Potassium: 4.5 mmol/L (ref 3.5–5.1)
Sodium: 136 mmol/L (ref 135–145)
Total Bilirubin: 0.3 mg/dL (ref 0.3–1.2)
Total Protein: 7 g/dL (ref 6.5–8.1)

## 2020-01-17 MED ORDER — PANTOPRAZOLE SODIUM 40 MG PO TBEC: 40.0000 mg | DELAYED_RELEASE_TABLET | Freq: Two times a day (BID) | ORAL | 2 refills | Status: AC

## 2020-01-17 MED ORDER — SUCRALFATE 1 G PO TABS: 1.0000 g | ORAL_TABLET | Freq: Four times a day (QID) | ORAL | 1 refills | Status: DC

## 2020-01-17 MED ORDER — SENNOSIDES-DOCUSATE SODIUM 8.6-50 MG PO TABS: 1.0000 | ORAL_TABLET | Freq: Two times a day (BID) | ORAL | 0 refills | Status: AC

## 2020-01-17 MED ORDER — AMLODIPINE BESYLATE 5 MG PO TABS: 5.0000 mg | ORAL_TABLET | Freq: Every day | ORAL | 0 refills | Status: AC

## 2020-01-17 NOTE — TOC Transition Note (Signed)
Transition of Care [facility]) - CM/SW Discharge Note   Patient Details  Name: [name] MRN: [id number] Date of Birth: May 11, 1946  [facility] CM/SW Contact:  Coralyn Helling, LCSW Phone Number: 01/17/2020, 9:37 AM   Clinical Narrative:   Patient transferring to [facility] for rehab. Patient will transport by EMS. RN report # [phone]    Final next level of care: Skilled Nursing Facility Barriers to Discharge: No Barriers Identified   Patient Goals and CMS Choice Patient states their goals for this hospitalization and ongoing recovery are:: To return home. CMS Medicare.gov Compare Post Acute Care list provided to:: Patient Represenative (must comment) Ryon Layton [phone], brother) Choice offered to / list presented to : Sibling  Discharge Placement              Patient [facility] bed at: [facility] Patient to be transferred to facility by: EMS Name of family member notified: Zollie Beckers (brother) Patient and family notified of of transfer: 01/17/20  Discharge Plan and Services In-house Referral: Clinical Social Work Discharge Planning Services: CM Consult Post Acute Care Choice: Skilled Nursing Facility          DME Arranged: N/A DME Agency: NA       HH Arranged: NA HH Agency: NA        Social Determinants of Health (SDOH) Interventions     Readmission Risk Interventions Readmission Risk Prevention Plan 01/13/2020  Transportation Screening Complete  HRI or Home Care Consult Complete  Social Work Consult for Recovery Care Planning/Counseling Complete  Palliative Care Screening Not Applicable  Medication Review Oceanographer) Complete  Some recent data might be hidden

## 2020-01-17 NOTE — Discharge Summary (Addendum)
Physician Discharge Summary  [name] ZOX:[id number] DOB: 1946-05-21 DOA: 01/04/2020  PCP: Gilda CreasePavelock, Richard M, MD  Admit date: 01/04/2020 Discharge date: 01/17/2020  Time spent: 35 minutes  Recommendations for Outpatient Follow-up:  1. Requires a soft diet several days in the skilled facility 2. Avoid nonsteroidals and ensure education is complete with regards to the same-does need to take Carafate Protonix scheduled for a period of time at least 1 month and then follow-up with gastroenterology Dr. Meridee ScoreMansouraty 3. Make sure Chem-12 and CBC in 1 week done at facility for discharge 4. Ensure that patient gets follow-up with pain management for?  Initiation of Suboxone if he so desires to continue-he was not taking this on hospitalization 5. Sepsis was ruled out this admissionb-no discernaible source--patient had SIRS this admission  Discharge Diagnoses:  Active Problems:   Gastrointestinal hemorrhage   Hypokalemia   Leukocytosis   Acute esophagitis   Malnutrition of moderate degree   Discharge Condition: Improved heart healthy  Diet recommendation: Heart healthy  Filed Weights   01/06/20 1310 01/10/20 0306  Weight: 72.8 kg 70.3 kg    History of present illness:  74 year old black male community dwelling Prior polysubstance abuse on Suboxone in the past, COPD, BPH, reflux, HTN, bipolar Admit 01/04/2020 nausea vomiting diarrhea subjective fever and melanotic-like stool  -this is in setting of Goody powders aspirin multi antihypertensives  On admission potassium 2.5 BUN/creatinine 87/6.4 gap 26 WBC 2.2 hemoglobin 7   GI consulted on admission started on clear liquid diet kept n.p.o. after midnight EGD performed showing esophagitis hiatal hernia gastritis nonbleeding duodenal ulcer Repeat EGD 01/10/2020 s esophagitis still present but duodenal ulcers were healing  During hospital stay had an episode of seizures and EEG was negative--- found to have SIRS secondary also  to staph epidermidis/leuk 2/9 is?  Contaminant TTE 01/13/2020 - for endocarditis-ID discuss plan of care HIDA scan was ordered but then canceled given low likelihood of this being biliary in etiology Patient had low-grade temperatures that self resolved during hospital stay and patient was stabilized for discharge to skilled facility  Hospital Course:  1. Melena secondary to upper GI bleed 2. ?  Ongoing concern of ischemia when upper endoscopy performed initially-negative H. Pylori/malignancy a. Abdominal pain seems controlled-continuing Protonix 40 twice daily for at least 8 weeks and Carafate 1 g 4 times daily for a month b. He will need a soft diet given esophagitis being still present on last EGD 8/20 3. Hyponatremia with metabolic acidosis a. Monitor trends with labs in the morning b. Probably just artifactual but will need follow-up labs 4. ?  Sepsis initially considered 2/2 staph epidermidis lugdunensis---ruled out on d/c  a. Rx vancomycin 8/19 through 8/24 b. Blood culture 8/18, 8/23 NGTD-prior physician D/w dr comer OD and TTE was negative therefore we discontinued the same c. He had low-grade temps without spiking fevers and was felt to be stable and optimized for discharge d. No antibiotics required at this time 5. Seizure disorder? a. Continue at this time monitoring of the same 6. Recurrent abdominal pain a. See above discussion 7. Acute blood loss anemia status post transfusion 8/17 2 units PRBC a. Secondary to melena b. Blood count acceptable-transfusion threshold less than 7 for him c. Needs labs in a week 8. AKI on admission a. Likely prerenal azotemia secondary to severe volume depletion b. Now resolved discontinue IV fluids completely allow diet 9. Urinary retention with BPH a. Continue Flomax 0.4 10. Polysubstance abuse cocaine positive this admission-at baseline on Suboxone  a. Stop taking Suboxone several months ago because of nausea with the same b. I have  encouraged him to cease completely any polysubstance abuse or any opiates and he was not discharged on Suboxone and should follow-up with his primary physician 11. BMI 21  Procedures:  Endoscopies (i.e. Studies not automatically included, echos, thoracentesis, etc; not x-rays)  Consultations:  GI  Discharge Exam: Vitals:   01/16/20 2124 01/17/20 0521  BP: (!) 138/92 128/76  Pulse: 95 90  Resp: 20 (!) 22  Temp: 99.5 F (37.5 C) 98.6 F (37 C)  SpO2: 99% 100%    General: Awake alert coherent no distress EOMI NCAT no focal deficit no icterus no pallor Cardiovascular: S1-S2 no murmur rub or gallop Respiratory: Clinically clear no rales no rhonchi Abdomen soft nontender no rebound no guarding whatsoever Neurologically intact no focal deficit  Discharge Instructions   Discharge Instructions    Diet - low sodium heart healthy   Complete by: As directed    Increase activity slowly   Complete by: As directed      Allergies as of 01/17/2020   No Known Allergies     Medication List    STOP taking these medications   amLODipine-benazepril 10-40 MG capsule Commonly known as: LOTREL   Aspirin Low Dose 81 MG EC tablet Generic drug: aspirin   Buprenorphine HCl-Naloxone HCl 8-2 MG Film   cloNIDine 0.1 MG tablet Commonly known as: CATAPRES   folic acid 1 MG tablet Commonly known as: FOLVITE   GOODY HEADACHE PO   omeprazole 40 MG capsule Commonly known as: PRILOSEC   thiamine 100 MG tablet   topiramate 50 MG tablet Commonly known as: TOPAMAX     TAKE these medications   acetaminophen 325 MG tablet Commonly known as: TYLENOL Take 2 tablets (650 mg total) by mouth every 6 (six) hours as needed for mild pain (or Fever >/= 101).   albuterol 108 (90 Base) MCG/ACT inhaler Commonly known as: VENTOLIN HFA Inhale 2 puffs into the lungs every 4 (four) hours as needed for wheezing or shortness of breath.   amLODipine 5 MG tablet Commonly known as: NORVASC Take 1  tablet (5 mg total) by mouth daily. Start taking on: January 18, 2020   multivitamin with minerals Tabs tablet Take 1 tablet by mouth daily.   nitroGLYCERIN 0.4 MG SL tablet Commonly known as: NITROSTAT Place 0.4 mg under the tongue every 5 (five) minutes x 3 doses as needed for chest pain.   pantoprazole 40 MG tablet Commonly known as: PROTONIX Take 1 tablet (40 mg total) by mouth 2 (two) times daily before a meal.   polyethylene glycol 17 g packet Commonly known as: MIRALAX / GLYCOLAX Take 17 g by mouth daily.   senna-docusate 8.6-50 MG tablet Commonly known as: Senokot-S Take 1 tablet by mouth 2 (two) times daily.   sucralfate 1 g tablet Commonly known as: Carafate Take 1 tablet (1 g total) by mouth 4 (four) times daily.   Symbicort 160-4.5 MCG/ACT inhaler Generic drug: budesonide-formoterol Inhale 2 puffs into the lungs 2 (two) times daily.   tamsulosin 0.4 MG Caps capsule Commonly known as: FLOMAX Take 0.4 mg by mouth daily.   traZODone 150 MG tablet Commonly known as: DESYREL Take 150 mg by mouth at bedtime as needed for sleep.      No Known Allergies  Contact information for after-discharge care    Destination    [facility] .   Service: Skilled Nursing Contact information: [phone]  [address] [place] [address] [phone]                   The results of significant diagnostics from this hospitalization (including imaging, microbiology, ancillary and laboratory) are listed below for reference.    Significant Diagnostic Studies: EEG  Result Date: 01/06/2020 Charlsie Quest, MD     01/06/2020 12:02 PM Patient Name: Rahsaan Weakland MRN: [id number] Epilepsy Attending: Charlsie Quest Referring Physician/Provider: Dr. Blanchard Mane Date: 01/06/2020 Duration: 23.58 minutes Patient history: 74 year old male with possible GI bleed .  EEG 12 for seizures. Level of alertness: Awake, asleep AEDs during EEG study: None  Technical aspects: This EEG study was done with scalp electrodes positioned according to the 10-20 International system of electrode placement. Electrical activity was acquired at a sampling rate of 500Hz  and reviewed with a high frequency filter of 70Hz  and a low frequency filter of 1Hz . EEG data were recorded continuously and digitally stored. Description: During awake state, no clear posterior dominant rhythm was seen.  Sleep was characterized by vertex waves, sleep spindles (12 to 14 Hz), maximal frontocentral region. EEG showed continuous generalized 3 to 6 Hz theta-delta slowing.  Hyperventilation and photic stimulation were not performed.   ABNORMALITY -Continuous slow, generalized IMPRESSION: This study is suggestive of moderate diffuse encephalopathy, nonspecific etiology. No seizures or epileptiform discharges were seen throughout the recording. Charlsie Quest   DG Chest 2 View  Result Date: 01/04/2020 CLINICAL DATA:  Repeat chest x-ray with nipple markers placed. EXAM: CHEST - 2 VIEW COMPARISON:  January 04, 2020 (1:25 p.m.) FINDINGS: There is no evidence of acute infiltrate. Bilateral nipple markers are noted. There is a small right pleural effusion. No pneumothorax is seen. The heart size and mediastinal contours are within normal limits. The visualized skeletal structures are unremarkable. IMPRESSION: 1. Small right pleural effusion. 2. Bilateral nipple markers without evidence of pneumothorax. Electronically Signed   By: Aram Candela M.D.   On: 01/04/2020 18:31   US Abdomen Complete  Result Date: 01/04/2020 CLINICAL DATA:  Abdominal pain EXAM: ABDOMEN ULTRASOUND COMPLETE COMPARISON:  None. FINDINGS: Gallbladder: The gallbladder is distended layering sludge/small stones. No sonographic Murphy sign seen. There is a mildly prominent gallbladder wall measuring up to 3.5 mm Common bile duct: Diameter: 5.3 mm Liver: Increased echotexture seen throughout. No focal abnormality or biliary ductal  dilatation. The liver is enlarged measuring 19 cm in craniocaudad dimension. Portal vein is patent on color Doppler imaging with normal direction of blood flow towards the liver. IVC: No abnormality visualized. Pancreas: Visualized portion unremarkable. Spleen: Size and appearance within normal limits. Right Kidney: Length: 11.3 cm. Echogenicity within normal limits. No mass or hydronephrosis visualized. Left Kidney: Length: 12.0cm. Echogenicity within normal limits. No mass or hydronephrosis visualized. Abdominal aorta: No aneurysm visualized. Other findings: None. IMPRESSION: Layering gallbladder sludge/small stones with a mildly prominent gallbladder wall. This is nonspecific, could be due to acute cholecystitis. Hepatic steatosis and mild hepatomegaly Electronically Signed   By: Jonna Clark M.D.   On: 01/04/2020 16:40   CT ABDOMEN PELVIS W CONTRAST  Result Date: 01/14/2020 CLINICAL DATA:  Acute abdominal pain. In patient with different abdominal pain than on admission. Recent EGD. EXAM: CT ABDOMEN AND PELVIS WITH CONTRAST TECHNIQUE: Multidetector CT imaging of the abdomen and pelvis was performed using the standard protocol following bolus administration of intravenous contrast. CONTRAST:  OMNIPAQUE IOHEXOL 300 MG/ML  SOLN COMPARISON:  CT 01/08/2020. abdominal ultrasound 01/04/2020 FINDINGS: Lower chest: Minor subsegmental  atelectasis in the left lung base. 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 Wall thickening of the distal esophagus with fluid level and mild adjacent fat stranding. Hepatobiliary: No focal hepatic abnormality. Slight decreased hepatic density suggesting steatosis. There is equivocal capsular nodularity. Gallstones on ultrasound are not well visualized by CT. No evidence of pericholecystic inflammation allowing for mild motion artifact. Pancreas: No ductal dilatation or inflammation. Spleen: Normal in size without focal abnormality. Adrenals/Urinary Tract: Stable adrenal  thickening without dominant nodule. No hydronephrosis or perinephric edema. Homogeneous renal enhancement with symmetric excretion on delayed phase imaging. Small cortical cyst in the left kidney unchanged. Urinary bladder is partially distended with multiple small bladder diverticula again seen. Mild bladder wall thickening. Stomach/Bowel: Fluid within the distal esophagus with wall thickening and adjacent paraesophageal fat stranding. Small paraesophageal nodes are subcentimeter. There is no evidence of extraluminal air or perforation. Small hiatal hernia. Decompressed stomach. Duodenal diverticulum involving the third/fourth portion without inflammation. Small bowel is unremarkable. No obstruction or inflammatory change. The appendix is not confidently visualized, there is no evidence of appendicitis. Moderate volume of stool in the colon. Occasional colonic diverticula. No colonic wall thickening or diverticulitis. Small volume of stool in the rectum. Vascular/Lymphatic: Aortic and branch atherosclerosis and iliac tortuosity. No aortic aneurysm. Patent portal vein. Retroaortic left renal vein. Small paraesophageal node adjacent to the distal esophagus. There is a 10 mm portal caval node, likely reactive. No bulky abdominopelvic adenopathy. Reproductive: TURP defect in the prostate. Other: No free air, free fluid, or intra-abdominal fluid collection. Fat in the left inguinal canal. Musculoskeletal: Multilevel degenerative change in the lumbar spine. Bones appear under mineralized. There are no acute or suspicious osseous abnormalities. IMPRESSION: 1. Wall thickening of the distal esophagus with intraluminal fluid and mild adjacent fat stranding, suspicious for esophagitis. Recommend correlation with recent endoscopic findings. There is small paraesophageal lymph nodes and may be reactive but are nonspecific. 2. Mild hepatic steatosis. There is equivocal capsular nodularity, raising concern for cirrhosis.  Recommend correlation with cirrhosis risk factors. 3. Chronic bladder wall thickening with multiple small bladder diverticula, similar to prior exam. 4. Colonic diverticulosis without diverticulitis. 5. Additional stable findings as described. Aortic Atherosclerosis (ICD10-I70.0). Electronically Signed   By: Narda Rutherford M.D.   On: 01/14/2020 16:38   CT ABDOMEN PELVIS W CONTRAST  Result Date: 01/08/2020 CLINICAL DATA:  Acute abdominal pain, unintended weight loss EXAM: CT ABDOMEN AND PELVIS WITH CONTRAST TECHNIQUE: Multidetector CT imaging of the abdomen and pelvis was performed using the standard protocol following bolus administration of intravenous contrast. CONTRAST:  OMNIPAQUE IOHEXOL 300 MG/ML  SOLN COMPARISON:  01/07/2020, 09/07/2014 FINDINGS: Lower chest: No acute pleural or parenchymal lung disease. Hepatobiliary: No focal liver abnormality is seen. No gallstones, gallbladder wall thickening, or biliary dilatation. Pancreas: Unremarkable. No pancreatic ductal dilatation or surrounding inflammatory changes. Spleen: Normal in size without focal abnormality. Adrenals/Urinary Tract: Stable nodularity of the left adrenal gland is nonspecific but likely reflects adenoma given stability. Right adrenal is grossly normal. Stable subcentimeter left renal hypodensity likely a cyst. Otherwise the kidneys enhance normally and symmetrically. No urinary tract calculi or obstructive uropathy. Foley catheter is seen within the bladder, with no filling defects. Small bladder diverticula are identified consistent with chronic bladder outlet obstruction. Stomach/Bowel: No bowel obstruction or ileus. No bowel wall thickening or inflammatory change. Large diverticulum off the third portion duodenum is unchanged. No evidence of inflammation. Small hiatal hernia is noted. Vascular/Lymphatic: There is extensive atherosclerosis of the aorta and  its branches. Retroaortic left renal vein is noted, a frequent anatomic  variant. No pathologic adenopathy. Reproductive: Prostate is not enlarged. Other: No free fluid or free gas. No abdominal wall hernia. Musculoskeletal: No acute or destructive bony lesions. Reconstructed images demonstrate no additional findings. IMPRESSION: 1. No acute intra-abdominal or intrapelvic process. 2. Small hiatal hernia. 3. Aortic Atherosclerosis (ICD10-I70.0). Electronically Signed   By: Sharlet Salina M.D.   On: 01/08/2020 20:55   DG Chest Port 1 View  Result Date: 01/09/2020 CLINICAL DATA:  Leukocytosis EXAM: PORTABLE CHEST 1 VIEW COMPARISON:  01/04/2020 FINDINGS: Normal heart size and mild aortic tortuosity. Widening of the upper right mediastinum is from vessels based on stability and prior imaging. Hyperinflation with diaphragm flattening. There is no edema, consolidation, effusion, or pneumothorax. IMPRESSION: COPD without acute superimposed finding. Electronically Signed   By: Marnee Spring M.D.   On: 01/09/2020 07:31   DG Chest Port 1 View  Result Date: 01/04/2020 CLINICAL DATA:  Vomiting for 1 week. EXAM: PORTABLE CHEST 1 VIEW COMPARISON:  11/08/2016 FINDINGS: Nodular prominence along the right diaphragmatic surface. No focal consolidation. No pleural effusion or pneumothorax. Heart and mediastinal contours are unremarkable. No acute osseous abnormality. IMPRESSION: 1. No acute cardiopulmonary disease. 2. Nodular prominence along the right diaphragmatic surface which may reflect a prominent nipple shadow or a true pulmonary nodule. Recommend further evaluation with a lateral view of the chest and a frontal view with a nipple markers. Electronically Signed   By: Elige Ko   On: 01/04/2020 14:07   DG Abd 2 Views  Result Date: 01/07/2020 CLINICAL DATA:  Abdominal pain with gastrointestinal bleeding EXAM: ABDOMEN - 2 VIEW COMPARISON:  November 11, 2016 FINDINGS: Supine and left lateral decubitus abdomen images obtained. There is moderate stool in the colon. There is no bowel dilatation  or air-fluid level to suggest bowel obstruction. No demonstrable free air. There is aortic and iliac artery atherosclerosis bilaterally. There are phleboliths in the pelvis as well. Visualized lung bases clear. IMPRESSION: Moderate stool in colon. No bowel obstruction or free air. Multiple foci of arterial vascular calcification evident. Aortic Atherosclerosis (ICD10-I70.0). Electronically Signed   By: Bretta Bang III M.D.   On: 01/07/2020 12:17   ECHOCARDIOGRAM COMPLETE  Result Date: 01/13/2020    ECHOCARDIOGRAM REPORT   Patient Name:   BANJAMIN STOVALL Date of Exam: 01/13/2020 Medical Rec #:  [id number]         Height:       72.0 in Accession #:    [id number]        Weight:       155.0 lb Date of Birth:  [birth year]        BSA:          1.912 m Patient Age:    73 years          BP:           151/85 mmHg Patient Gender: M                 HR:           93 bpm. Exam Location:  Inpatient Procedure: 2D Echo, Color Doppler and Cardiac Doppler Indications:    Bacteremia  History:        Patient has no prior history of Echocardiogram examinations.                 COPD; Risk Factors:Hypertension, Dyslipidemia and Polysubstance  abuse.  Sonographer:    Irving Burton Senior RDCS Referring Phys: [id number] Monica Martinez [facility]  Sonographer Comments: Technically difficult study due to poor echo windows. IMPRESSIONS  1. Left ventricular ejection fraction, by estimation, is 70 to 75%. The left ventricle has hyperdynamic function. The left ventricle has no regional wall motion abnormalities. There is severe left ventricular hypertrophy. Left ventricular diastolic parameters are consistent with Grade I diastolic dysfunction (impaired relaxation).  2. Right ventricular systolic function is normal. The right ventricular size is normal.  3. Left atrial size was moderately dilated.  4. The mitral valve is abnormal. Trivial mitral valve regurgitation.  5. The aortic valve is tricuspid. Aortic valve regurgitation is not  visualized. Mild to moderate aortic valve sclerosis/calcification is present, without any evidence of aortic stenosis.  6. Aortic dilatation noted. There is mild to moderate dilatation at the level of the sinuses of Valsalva measuring 43 mm.  7. The inferior vena cava is normal in size with <50% respiratory variability, suggesting right atrial pressure of 8 mmHg. Conclusion(s)/Recommendation(s): No evidence of valvular vegetations on this transthoracic echocardiogram. Would recommend a transesophageal echocardiogram to exclude infective endocarditis if clinically indicated. FINDINGS  Left Ventricle: Left ventricular ejection fraction, by estimation, is 70 to 75%. The left ventricle has hyperdynamic function. The left ventricle has no regional wall motion abnormalities. The left ventricular internal cavity size was normal in size. There is severe left ventricular hypertrophy. Left ventricular diastolic parameters are consistent with Grade I diastolic dysfunction (impaired relaxation). Indeterminate filling pressures. Right Ventricle: The right ventricular size is normal. No increase in right ventricular wall thickness. Right ventricular systolic function is normal. Left Atrium: Left atrial size was moderately dilated. Right Atrium: Right atrial size was normal in size. Pericardium: There is no evidence of pericardial effusion. Mitral Valve: The mitral valve is abnormal. Moderate mitral annular calcification. Trivial mitral valve regurgitation. Tricuspid Valve: The tricuspid valve is grossly normal. Tricuspid valve regurgitation is trivial. Aortic Valve: The aortic valve is tricuspid. Aortic valve regurgitation is not visualized. Mild to moderate aortic valve sclerosis/calcification is present, without any evidence of aortic stenosis. Pulmonic Valve: The pulmonic valve was grossly normal. Pulmonic valve regurgitation is not visualized. Aorta: Aortic dilatation noted. There is mild to moderate dilatation at the level  of the sinuses of Valsalva measuring 43 mm. Venous: The inferior vena cava is normal in size with less than 50% respiratory variability, suggesting right atrial pressure of 8 mmHg. IAS/Shunts: No atrial level shunt detected by color flow Doppler.  LEFT VENTRICLE PLAX 2D LVIDd:         3.60 cm  Diastology LVIDs:         2.10 cm  LV e' lateral:   5.82 cm/s LV PW:         1.50 cm  LV E/e' lateral: 11.3 LV IVS:        1.70 cm  LV e' medial:    4.88 cm/s LVOT diam:     2.30 cm  LV E/e' medial:  13.5 LV SV:         86 LV SV Index:   45 LVOT Area:     4.15 cm  RIGHT VENTRICLE RV S prime:     14.80 cm/s TAPSE (M-mode): 2.1 cm LEFT ATRIUM             Index       RIGHT ATRIUM           Index LA diam:  4.10 cm 2.14 cm/m  RA Area:     11.80 cm LA Vol (A2C):   66.1 ml 34.58 ml/m RA Volume:   24.20 ml  12.66 ml/m LA Vol (A4C):   84.5 ml 44.20 ml/m LA Biplane Vol: 75.3 ml 39.39 ml/m  AORTIC VALVE LVOT Vmax:   82.20 cm/s LVOT Vmean:  61.400 cm/s LVOT VTI:    0.208 m  AORTA Ao Root diam: 4.30 cm MITRAL VALVE MV Area (PHT): 2.32 cm    SHUNTS MV Decel Time: 327 msec    Systemic VTI:  0.21 m MV E velocity: 66.00 cm/s  Systemic Diam: 2.30 cm MV A velocity: 93.80 cm/s MV E/A ratio:  0.70 Zoila Shutter MD Electronically signed by Zoila Shutter MD Signature Date/Time: 01/13/2020/1:55:16 PM    Final     Microbiology: Recent Results (from the past 240 hour(s))  Culture, blood (routine x 2)     Status: None   Collection Time: 01/09/20  6:13 AM   Specimen: BLOOD  Result Value Ref Range Status   Specimen Description   Final    BLOOD RIGHT ARM Performed at [facility], [address] [place], [place] [address]    Special Requests   Final    BOTTLES DRAWN AEROBIC AND ANAEROBIC Blood Culture adequate volume Performed at [facility], [address], [place], [place] [address]    Culture   Final    NO GROWTH 5 DAYS Performed at [facility], [address],  [place], [place] [address]    Report Status 01/14/2020 FINAL  Final  Culture, blood (routine x 2)     Status: Abnormal   Collection Time: 01/09/20  6:13 AM   Specimen: BLOOD  Result Value Ref Range Status   Specimen Description   Final    BLOOD BLOOD RIGHT HAND Performed at [facility], [address] [place], [place] [address]    Special Requests   Final    BOTTLES DRAWN AEROBIC ONLY Blood Culture adequate volume Performed at [facility], [address] [place], [place] [address]    Culture  Setup Time   Final    AEROBIC BOTTLE ONLY GRAM POSITIVE COCCI Organism ID to follow CRITICAL RESULT CALLED TO, READ BACK BY AND VERIFIED WITH: L POINDEXTER PHARMD 01/10/20 0241 JDW    Culture (A)  Final    STAPHYLOCOCCUS EPIDERMIDIS STAPHYLOCOCCUS LUGDUNENSIS THE SIGNIFICANCE OF ISOLATING THIS ORGANISM FROM A SINGLE SET OF BLOOD CULTURES WHEN MULTIPLE SETS ARE DRAWN IS UNCERTAIN. PLEASE NOTIFY THE MICROBIOLOGY DEPARTMENT WITHIN ONE WEEK IF SPECIATION AND SENSITIVITIES ARE REQUIRED. Performed at [facility], [address], [place], [place] [address]    Report Status 01/12/2020 FINAL  Final  Blood Culture ID Panel (Reflexed)     Status: Abnormal   Collection Time: 01/09/20  6:13 AM  Result Value Ref Range Status   Enterococcus faecalis NOT DETECTED NOT DETECTED Final   Enterococcus Faecium NOT DETECTED NOT DETECTED Final   Listeria monocytogenes NOT DETECTED NOT DETECTED Final   Staphylococcus species DETECTED (A) NOT DETECTED Final    Comment: CRITICAL RESULT CALLED TO, READ BACK BY AND VERIFIED WITH: L POINDEXTER PHARMD 01/10/20 0241 JDW    Staphylococcus aureus (BCID) NOT DETECTED NOT DETECTED Final   Staphylococcus epidermidis DETECTED (A) NOT DETECTED Final    Comment: Methicillin (oxacillin) resistant coagulase negative staphylococcus. Possible blood culture contaminant (unless isolated from more than one blood culture draw or clinical case  suggests pathogenicity). No antibiotic treatment is indicated for blood  culture contaminants. CRITICAL RESULT CALLED TO, READ BACK BY AND VERIFIED WITH: L POINDEXTER PHARMD 01/10/20 0241 JDW    Staphylococcus lugdunensis DETECTED (A) NOT DETECTED Final    Comment: Methicillin (oxacillin) resistant coagulase negative staphylococcus. Possible blood culture contaminant (unless isolated from more than one blood culture draw or clinical case suggests pathogenicity). No antibiotic treatment is indicated for blood  culture contaminants. CRITICAL RESULT CALLED TO, READ BACK BY AND VERIFIED WITH: L POINDEXTER PHARMD 01/10/20 0241 JDW    Streptococcus species NOT DETECTED NOT DETECTED Final   Streptococcus agalactiae NOT DETECTED NOT DETECTED Final   Streptococcus pneumoniae NOT DETECTED NOT DETECTED Final   Streptococcus pyogenes NOT DETECTED NOT DETECTED Final   A.calcoaceticus-baumannii NOT DETECTED NOT DETECTED Final   Bacteroides fragilis NOT DETECTED NOT DETECTED Final   Enterobacterales NOT DETECTED NOT DETECTED Final   Enterobacter cloacae complex NOT DETECTED NOT DETECTED Final   Escherichia coli NOT DETECTED NOT DETECTED Final   Klebsiella aerogenes NOT DETECTED NOT DETECTED Final   Klebsiella oxytoca NOT DETECTED NOT DETECTED Final   Klebsiella pneumoniae NOT DETECTED NOT DETECTED Final   Proteus species NOT DETECTED NOT DETECTED Final   Salmonella species NOT DETECTED NOT DETECTED Final   Serratia marcescens NOT DETECTED NOT DETECTED Final   Haemophilus influenzae NOT DETECTED NOT DETECTED Final   Neisseria meningitidis NOT DETECTED NOT DETECTED Final   Pseudomonas aeruginosa NOT DETECTED NOT DETECTED Final   Stenotrophomonas maltophilia NOT DETECTED NOT DETECTED Final   Candida albicans NOT DETECTED NOT DETECTED Final   Candida auris NOT DETECTED NOT DETECTED Final   Candida glabrata NOT DETECTED NOT DETECTED Final   Candida krusei NOT DETECTED NOT DETECTED Final   Candida  parapsilosis NOT DETECTED NOT DETECTED Final   Candida tropicalis NOT DETECTED NOT DETECTED Final   Cryptococcus neoformans/gattii NOT DETECTED NOT DETECTED Final   Methicillin resistance mecA/C DETECTED (A) NOT DETECTED Final    Comment: CRITICAL RESULT CALLED TO, READ BACK BY AND VERIFIED WITH: L POINDEXTER PHARMD 01/10/20 0241 JDW Performed at [facility], [address], [place], [place] [address]   Culture, blood (routine x 2)     Status: None   Collection Time: 01/12/20 11:43 AM   Specimen: BLOOD  Result Value Ref Range Status   Specimen Description   Final    BLOOD BLOOD LEFT ARM Performed at [facility], [address] [place], [place] [address]    Special Requests   Final    BOTTLES DRAWN AEROBIC ONLY Blood Culture adequate volume Performed at [facility], [address], [place], [place] [address]    Culture   Final    NO GROWTH 5 DAYS Performed at [facility], [address], [place], [place] [address]    Report Status 01/17/2020 FINAL  Final  Culture, blood (routine x 2)     Status: None   Collection Time: 01/12/20 11:43 AM   Specimen: BLOOD  Result Value Ref Range Status   Specimen Description   Final    BLOOD BLOOD LEFT ARM Performed at [facility], [address], [place], [place] [address]    Special Requests   Final    BOTTLES DRAWN AEROBIC AND ANAEROBIC Blood Culture adequate volume Performed at [facility], [address] [place], [place] [address]    Culture   Final    NO GROWTH 5 DAYS Performed at  [facility], [address] [place], [place], [place] [address]    Report Status 01/17/2020 FINAL  Final  SARS CORONAVIRUS 2 (TAT 6-24 HRS) Nasopharyngeal Nasopharyngeal Swab     Status: None   Collection Time: 01/13/20 12:12 PM   Specimen: Nasopharyngeal Swab  Result Value Ref Range Status   SARS Coronavirus 2 NEGATIVE NEGATIVE Final    Comment:  (NOTE) SARS-CoV-2 target nucleic acids are NOT DETECTED.  The SARS-CoV-2 RNA is generally detectable in upper and lower respiratory specimens during the acute phase of infection. Negative results do not preclude SARS-CoV-2 infection, do not rule out co-infections with other pathogens, and should not be used as the sole basis for treatment or other patient management decisions. Negative results must be combined with clinical observations, patient history, and epidemiological information. The expected result is Negative.  Fact Sheet for Patients: HairSlick.no  Fact Sheet for Healthcare Providers: quierodirigir.com  This test is not yet approved or cleared by the [place] FDA and  has been authorized for detection and/or diagnosis of SARS-CoV-2 by FDA under an Emergency Use Authorization (EUA). This EUA will remain  in effect (meaning this test can be used) for the duration of the COVID-19 declaration under Se ction 564(b)(1) of the Act, 21 U.S.C. section 360bbb-3(b)(1), unless the authorization is terminated or revoked sooner.  Performed at [facility], [address], [place], [place] [address]      Labs: Basic Metabolic Panel: Recent Labs  Lab 01/11/20 0229 01/11/20 4174 01/12/20 0526 01/13/20 0558 01/15/20 0532 01/16/20 0724 01/17/20 0533  NA   < >  --  140 141 131* 134* 136  K   < >  --  4.0 4.1 4.0 4.4 4.5  CL   < >  --  110 110 105 105 107  CO2   < >  --  21* 21* 17* 18* 20*  GLUCOSE   < >  --  94 92 120* 107* 97  BUN   < >  --  13 9 7* 10 9  CREATININE   < >  --  0.73 0.73 0.76 0.68 0.61  CALCIUM   < >  --  8.4* 8.5* 8.2* 8.4* 8.8*  MG  --  1.5* 1.7 1.6*  --   --   --   PHOS  --  2.5  --   --   --   --   --    < > = values in this interval not displayed.   Liver Function Tests: Recent Labs  Lab 01/15/20 0532 01/16/20 0724 01/17/20 0533  AST 12* 12* 15  ALT 15 14 14   ALKPHOS 66 70 75   BILITOT 1.0 0.4 0.3  PROT 6.1* 6.4* 7.0  ALBUMIN 2.5* 2.6* 2.8*   Recent Labs  Lab 01/15/20 0532  LIPASE 22   No results for input(s): AMMONIA in the last 168 hours. CBC: Recent Labs  Lab 01/12/20 0526 01/13/20 0558 01/15/20 0532 01/16/20 0724 01/17/20 0533  WBC 13.2* 8.2 4.7 4.8 3.5*  NEUTROABS 10.9* 5.8 2.9 3.0 1.6*  HGB 8.2* 8.6* 8.0* 8.5* 9.7*  HCT 27.5* 29.9* 27.1* 27.8* 33.4*  MCV 86.2 87.2 85.5 83.7 85.9  PLT 461* 500* 475* 484* 512*   Cardiac Enzymes: No results for input(s): CKTOTAL, CKMB, CKMBINDEX, TROPONINI in the last 168 hours. BNP: BNP (last 3 results) No results for input(s): BNP in the last 8760 hours.  ProBNP (last 3 results) No results for input(s): PROBNP in the  last 8760 hours.  CBG: No results for input(s): GLUCAP in the last 168 hours.     Signed:  Rhetta Mura MD   [facility] 01/17/2020, 8:50 AM

## 2020-01-20 DIAGNOSIS — K209 Esophagitis, unspecified without bleeding: Secondary | ICD-10-CM | POA: Diagnosis not present

## 2020-01-20 DIAGNOSIS — K269 Duodenal ulcer, unspecified as acute or chronic, without hemorrhage or perforation: Secondary | ICD-10-CM | POA: Diagnosis not present

## 2020-01-20 DIAGNOSIS — K9289 Other specified diseases of the digestive system: Secondary | ICD-10-CM | POA: Diagnosis not present

## 2020-01-20 DIAGNOSIS — J984 Other disorders of lung: Secondary | ICD-10-CM | POA: Diagnosis not present

## 2020-01-22 DIAGNOSIS — Z7189 Other specified counseling: Secondary | ICD-10-CM | POA: Diagnosis not present

## 2020-01-22 DIAGNOSIS — I479 Paroxysmal tachycardia, unspecified: Secondary | ICD-10-CM | POA: Diagnosis not present

## 2020-01-22 DIAGNOSIS — K2970 Gastritis, unspecified, without bleeding: Secondary | ICD-10-CM | POA: Diagnosis not present

## 2020-01-24 DIAGNOSIS — R569 Unspecified convulsions: Secondary | ICD-10-CM | POA: Diagnosis not present

## 2020-01-24 DIAGNOSIS — K922 Gastrointestinal hemorrhage, unspecified: Secondary | ICD-10-CM | POA: Diagnosis not present

## 2020-01-24 DIAGNOSIS — R5381 Other malaise: Secondary | ICD-10-CM | POA: Diagnosis not present

## 2020-01-24 DIAGNOSIS — I479 Paroxysmal tachycardia, unspecified: Secondary | ICD-10-CM | POA: Diagnosis not present

## 2020-01-28 ENCOUNTER — Telehealth: Payer: Self-pay

## 2020-01-28 NOTE — Telephone Encounter (Signed)
NOTES ON FILE FROM [facility] [phone], SENT REFERRAL TO SCHEDULING 

## 2020-01-29 ENCOUNTER — Other Ambulatory Visit: Payer: Self-pay

## 2020-01-29 ENCOUNTER — Encounter (HOSPITAL_COMMUNITY): Payer: Self-pay | Admitting: *Deleted

## 2020-01-29 ENCOUNTER — Emergency Department (HOSPITAL_COMMUNITY): Payer: Medicare Other

## 2020-01-29 ENCOUNTER — Emergency Department (HOSPITAL_COMMUNITY)
Admission: EM | Admit: 2020-01-29 | Discharge: 2020-01-30 | Disposition: A | Discharge status: Home / Self Care | Payer: Medicare Other | Attending: Emergency Medicine | Admitting: Emergency Medicine

## 2020-01-29 DIAGNOSIS — Z87891 Personal history of nicotine dependence: Secondary | ICD-10-CM | POA: Diagnosis not present

## 2020-01-29 DIAGNOSIS — I10 Essential (primary) hypertension: Secondary | ICD-10-CM | POA: Diagnosis not present

## 2020-01-29 DIAGNOSIS — Z7951 Long term (current) use of inhaled steroids: Secondary | ICD-10-CM | POA: Insufficient documentation

## 2020-01-29 DIAGNOSIS — J449 Chronic obstructive pulmonary disease, unspecified: Secondary | ICD-10-CM | POA: Diagnosis not present

## 2020-01-29 DIAGNOSIS — R0689 Other abnormalities of breathing: Secondary | ICD-10-CM | POA: Diagnosis not present

## 2020-01-29 DIAGNOSIS — Z79899 Other long term (current) drug therapy: Secondary | ICD-10-CM | POA: Insufficient documentation

## 2020-01-29 DIAGNOSIS — R6889 Other general symptoms and signs: Secondary | ICD-10-CM | POA: Diagnosis not present

## 2020-01-29 DIAGNOSIS — Z743 Need for continuous supervision: Secondary | ICD-10-CM | POA: Diagnosis not present

## 2020-01-29 DIAGNOSIS — J439 Emphysema, unspecified: Secondary | ICD-10-CM | POA: Diagnosis not present

## 2020-01-29 DIAGNOSIS — R000 Tachycardia, unspecified: Secondary | ICD-10-CM | POA: Diagnosis not present

## 2020-01-29 DIAGNOSIS — E278 Other specified disorders of adrenal gland: Secondary | ICD-10-CM | POA: Diagnosis not present

## 2020-01-29 DIAGNOSIS — J45909 Unspecified asthma, uncomplicated: Secondary | ICD-10-CM | POA: Diagnosis not present

## 2020-01-29 DIAGNOSIS — K219 Gastro-esophageal reflux disease without esophagitis: Secondary | ICD-10-CM | POA: Diagnosis not present

## 2020-01-29 DIAGNOSIS — K2100 Gastro-esophageal reflux disease with esophagitis, without bleeding: Secondary | ICD-10-CM | POA: Diagnosis not present

## 2020-01-29 DIAGNOSIS — R509 Fever, unspecified: Secondary | ICD-10-CM | POA: Diagnosis not present

## 2020-01-29 DIAGNOSIS — R52 Pain, unspecified: Secondary | ICD-10-CM | POA: Diagnosis not present

## 2020-01-29 DIAGNOSIS — R12 Heartburn: Secondary | ICD-10-CM | POA: Diagnosis not present

## 2020-01-29 DIAGNOSIS — R0602 Shortness of breath: Secondary | ICD-10-CM | POA: Diagnosis not present

## 2020-01-29 DIAGNOSIS — R064 Hyperventilation: Secondary | ICD-10-CM | POA: Diagnosis not present

## 2020-01-29 DIAGNOSIS — I700 Atherosclerosis of aorta: Secondary | ICD-10-CM | POA: Diagnosis not present

## 2020-01-29 DIAGNOSIS — K7460 Unspecified cirrhosis of liver: Secondary | ICD-10-CM | POA: Diagnosis not present

## 2020-01-29 DIAGNOSIS — R1013 Epigastric pain: Secondary | ICD-10-CM | POA: Diagnosis present

## 2020-01-29 LAB — HEPATIC FUNCTION PANEL
ALT: 14 U/L (ref 0–44)
AST: 13 U/L — ABNORMAL LOW (ref 15–41)
Albumin: 2.8 g/dL — ABNORMAL LOW (ref 3.5–5.0)
Alkaline Phosphatase: 81 U/L (ref 38–126)
Bilirubin, Direct: 0.1 mg/dL (ref 0.0–0.2)
Indirect Bilirubin: 0.2 mg/dL — ABNORMAL LOW (ref 0.3–0.9)
Total Bilirubin: 0.3 mg/dL (ref 0.3–1.2)
Total Protein: 7 g/dL (ref 6.5–8.1)

## 2020-01-29 LAB — PROTIME-INR
INR: 1.2 (ref 0.8–1.2)
Prothrombin Time: 14.7 seconds (ref 11.4–15.2)

## 2020-01-29 LAB — BASIC METABOLIC PANEL
Anion gap: 12 (ref 5–15)
BUN: 9 mg/dL (ref 8–23)
CO2: 21 mmol/L — ABNORMAL LOW (ref 22–32)
Calcium: 9.2 mg/dL (ref 8.9–10.3)
Chloride: 108 mmol/L (ref 98–111)
Creatinine, Ser: 0.66 mg/dL (ref 0.61–1.24)
GFR calc Af Amer: >60 mL/min (ref >60)
GFR calc non Af Amer: >60 mL/min (ref >60)
Glucose, Bld: 82 mg/dL (ref 70–99)
Potassium: 4.6 mmol/L (ref 3.5–5.1)
Sodium: 141 mmol/L (ref 135–145)

## 2020-01-29 LAB — URINALYSIS, ROUTINE W REFLEX MICROSCOPIC
Bilirubin Urine: NEGATIVE
Glucose, UA: NEGATIVE mg/dL
Hgb urine dipstick: NEGATIVE
Ketones, ur: NEGATIVE mg/dL
Leukocytes,Ua: NEGATIVE
Nitrite: NEGATIVE
Protein, ur: NEGATIVE mg/dL
Specific Gravity, Urine: >1.046 — ABNORMAL HIGH (ref 1.005–1.030)
pH: 7 (ref 5.0–8.0)

## 2020-01-29 LAB — LIPASE, BLOOD: Lipase: 30 U/L (ref 11–51)

## 2020-01-29 LAB — CBC
HCT: 37.1 % — ABNORMAL LOW (ref 39.0–52.0)
Hemoglobin: 11.1 g/dL — ABNORMAL LOW (ref 13.0–17.0)
MCH: 25.3 pg — ABNORMAL LOW (ref 26.0–34.0)
MCHC: 29.9 g/dL — ABNORMAL LOW (ref 30.0–36.0)
MCV: 84.7 fL (ref 80.0–100.0)
Platelets: 470 10*3/uL — ABNORMAL HIGH (ref 150–400)
RBC: 4.38 MIL/uL (ref 4.22–5.81)
RDW: 18.3 % — ABNORMAL HIGH (ref 11.5–15.5)
WBC: 14.2 10*3/uL — ABNORMAL HIGH (ref 4.0–10.5)
nRBC: 0 % (ref 0.0–0.2)

## 2020-01-29 LAB — LACTIC ACID, PLASMA: Lactic Acid, Venous: 1.8 mmol/L (ref 0.5–1.9)

## 2020-01-29 LAB — APTT: aPTT: 35 seconds (ref 24–36)

## 2020-01-29 LAB — TROPONIN I (HIGH SENSITIVITY)
Troponin I (High Sensitivity): 4 ng/L (ref <18)
Troponin I (High Sensitivity): 4 ng/L (ref <18)

## 2020-01-29 MED ORDER — SUCRALFATE 1 G PO TABS
1.0000 g | ORAL_TABLET | Freq: Once | ORAL | Status: AC
  Administered 2020-01-29: 1 g via ORAL
  Filled 2020-01-29: qty 1

## 2020-01-29 MED ORDER — LACTATED RINGERS IV SOLN: INTRAVENOUS | Status: DC

## 2020-01-29 MED ORDER — IOHEXOL 300 MG/ML  SOLN
100.0000 mL | Freq: Once | INTRAMUSCULAR | Status: AC | PRN
  Administered 2020-01-29: 100 mL via INTRAVENOUS

## 2020-01-29 MED ORDER — PANTOPRAZOLE SODIUM 40 MG IV SOLR
40.0000 mg | Freq: Once | INTRAVENOUS | Status: AC
  Administered 2020-01-29: 40 mg via INTRAVENOUS
  Filled 2020-01-29: qty 40

## 2020-01-29 MED ORDER — FAMOTIDINE 40 MG PO TABS: 40.0000 mg | ORAL_TABLET | Freq: Two times a day (BID) | ORAL | 0 refills | Status: AC

## 2020-01-29 MED ORDER — SUCRALFATE 1 G PO TABS: 1.0000 g | ORAL_TABLET | Freq: Four times a day (QID) | ORAL | 0 refills | Status: AC

## 2020-01-29 NOTE — ED Notes (Signed)
x2 unsuccessful IV attempts

## 2020-01-29 NOTE — ED Triage Notes (Signed)
BIB EMS with heartburn, sounds chronic and has become worse over last couple of weeks. Hyperventilating at intervals. Has history of GI Bleed. Burning worse with lying down. No N/V/D. 138/82-100-96% RA.

## 2020-01-29 NOTE — ED Provider Notes (Signed)
[facility]-EMERGENCY DEPT Provider Note   CSN: [id number] Arrival date & time: 01/29/20  1236     History Chief Complaint  Patient presents with   Heartburn    [name] is a 74 y.o. male with PMH significant for polysubstance abuse, COPD, BPH, GERD, HTN, and recent admission to the hospital 01/04/2020 after he was found to be anemic in the context of upper GI bleed who presents the ED with complaints of epigastric pain.  Severe acid related esophagitis and duodenal bulb ulcers was noted on upper EGD.  He was positive for cocaine at time of admission.  Concern for sepsis during admission and was given IV vancomycin but was ultimately discharged home without any further antibiotics.  TTE completed 01/13/2020 negative for evidence of endocarditis.  CT abdomen pelvis obtained was unremarkable.  Dr. Luciana Axe, infectious disease, was consulted and if no further growth on repeat blood cultures, IV vancomycin can be discontinued.  Patient was ultimately discharged home with Protonix 40 mg twice daily x8 weeks and Carafate 1 g 4 times daily x1 month.    On my examination, patient tells me that he has been having intermittent chills for the past few days in addition to his worsening abdominal pain.  He states that his epigastric abdominal pain is worse than when he was previously admitted to the hospital.  He states that he has been taking his omeprazole twice daily, as directed.  He also states that he has been consuming more Tums than he would care to admit.  Patient denies any illicit drug use since prior to last admission.  He has not been taking his Carafate medications, as directed.  He states that his chest pain is worse when he is laying flat at night and it is so severe that he cannot catch his breath.  Patient does not have a thermometer at home and does not check his temperature regularly.  He denies any cough, exertional chest pain, back pain, urinary symptoms, or changes  in bowel habits.  He states that his appetite is intact and he is eating regularly.  Patient denies any globus sensation.  HPI     Past Medical History:  Diagnosis Date   Arthritis    "arms, elbows" (11/08/2016)   Asthma    COPD (chronic obstructive pulmonary disease) (HCC)    CVA (cerebral vascular accident) (HCC) 12/2004   and hemorrhagic likely secondary to very uncontrolled hypertension /notes 10/06/2010   GERD (gastroesophageal reflux disease)    High cholesterol    Hypertension    Migraine    "just started having them in the last couple weeks; qod" (11/08/2016)   Peptic ulcer disease    Pneumonia 02/2007    Patient Active Problem List   Diagnosis Date Noted   Malnutrition of moderate degree 01/06/2020   Acute esophagitis    Gastrointestinal hemorrhage 01/04/2020   Hypokalemia 01/04/2020   Leukocytosis 01/04/2020   Frontal headache    Hypertension 11/08/2016   COPD (chronic obstructive pulmonary disease) (HCC) 11/08/2016   GERD (gastroesophageal reflux disease) 11/08/2016   Acute kidney injury (HCC) 11/08/2016   BPH (benign prostatic hyperplasia) 11/08/2016   Polysubstance dependence (HCC) 11/08/2016   Hip pain, bilateral 09/17/2014   AKI (acute kidney injury) (HCC) 09/10/2014   Orthostatic hypotension 09/10/2014   Nausea and vomiting 09/10/2014   Polysubstance abuse (HCC) 09/10/2014   Syncope 09/09/2014   Peptic ulcer disease    Benign prostatic hypertrophy (BPH) with incomplete bladder emptying 03/17/2014  CHEST PAIN, RIGHT 05/23/2007   Essential hypertension 04/05/2007   NON-HEALING SURGICAL WOUND NEC 03/19/2007   METHICILLIN-RESISTANT STAPHYLOCOCCUS AUREUS PNEUMONIA 03/15/2007   EMPYEMA 03/15/2007    Past Surgical History:  Procedure Laterality Date   BIOPSY  01/05/2020   Procedure: BIOPSY;  Surgeon: Lemar Lofty., MD;  Location: [facility];  Service: Gastroenterology;;   ESOPHAGOGASTRODUODENOSCOPY N/A  01/05/2020   Procedure: ESOPHAGOGASTRODUODENOSCOPY (EGD);  Surgeon: Lemar Lofty., MD;  Location: [facility];  Service: Gastroenterology;  Laterality: N/A;   ESOPHAGOGASTRODUODENOSCOPY (EGD) WITH PROPOFOL N/A 01/10/2020   Procedure: ESOPHAGOGASTRODUODENOSCOPY (EGD) WITH PROPOFOL;  Surgeon: Rachael Fee, MD;  Location: [facility];  Service: Endoscopy;  Laterality: N/A;   MASS EXCISION Left 01/2003   of the occipital scalp and soft tissue mass of the left face/notes 10/06/2010   TONSILLECTOMY  1950s   TRANSURETHRAL RESECTION OF PROSTATE N/A 03/17/2014   Procedure: TRANSURETHRAL RESECTION OF THE PROSTATE (TURP) WITH GYRUS;  Surgeon: Garnett Farm, MD;  Location: [facility];  Service: Urology;  Laterality: N/A;   VIDEO ASSISTED THORACOSCOPY (VATS)/EMPYEMA Right 02/2007    drainage of empyema with decortication /notes 09/22/2010       Family History  Problem Relation Age of Onset   Cirrhosis Mother    Cancer Brother     Social History   Tobacco Use   Smoking status: Former Smoker    Packs/day: 0.50    Years: 40.00    Pack years: 20.00    Types: Cigarettes    Quit date: 10/21/2016    Years since quitting: 3.2   Smokeless tobacco: Never Used   Tobacco comment: 05/02/18 5 cigs daily  Vaping Use   Vaping Use: Never used  Substance Use Topics   Alcohol use: Not Currently    Alcohol/week: 0.0 standard drinks    Comment: 11/08/2016 "nothing in the last year"   Drug use: Not Currently    Types: Heroin    Comment: 11/08/2016 "nothing in the last year"    Home Medications Prior to Admission medications   Medication Sig Start Date End Date Taking? Authorizing Provider  acetaminophen (TYLENOL) 325 MG tablet Take 2 tablets (650 mg total) by mouth every 6 (six) hours as needed for mild pain (or Fever >/= 101). 09/12/14  Yes Rama, Maryruth Bun, MD  albuterol (PROVENTIL HFA;VENTOLIN HFA) 108 (90 Base) MCG/ACT inhaler Inhale 2 puffs into the lungs every 4 (four) hours as  needed for wheezing or shortness of breath. 10/31/16  Yes Cathren Laine, MD  amLODipine (NORVASC) 5 MG tablet Take 1 tablet (5 mg total) by mouth daily. 01/18/20  Yes Rhetta Mura, MD  nitroGLYCERIN (NITROSTAT) 0.4 MG SL tablet Place 0.4 mg under the tongue every 5 (five) minutes x 3 doses as needed for chest pain. 09/26/19  Yes [provider]  pantoprazole (PROTONIX) 40 MG tablet Take 1 tablet (40 mg total) by mouth 2 (two) times daily before a meal. 01/17/20  Yes Rhetta Mura, MD  SYMBICORT 160-4.5 MCG/ACT inhaler Inhale 2 puffs into the lungs 2 (two) times daily.  02/22/18  Yes [provider]  tamsulosin (FLOMAX) 0.4 MG CAPS capsule Take 0.4 mg by mouth daily. 10/24/19  Yes [provider]  famotidine (PEPCID) 40 MG tablet Take 1 tablet (40 mg total) by mouth 2 (two) times daily. 01/29/20   Lorelee New, PA-C  Multiple Vitamin (MULTIVITAMIN WITH MINERALS) TABS tablet Take 1 tablet by mouth daily. Patient not taking: Reported on 11/08/2016 09/12/14   Rama, Maryruth Bun,  MD  polyethylene glycol (MIRALAX / GLYCOLAX) packet Take 17 g by mouth daily. Patient not taking: Reported on 05/02/2018 11/12/16   Rhetta Mura, MD  senna-docusate (SENOKOT-S) 8.6-50 MG tablet Take 1 tablet by mouth 2 (two) times daily. Patient not taking: Reported on 01/29/2020 01/17/20   Rhetta Mura, MD  sucralfate (CARAFATE) 1 g tablet Take 1 tablet (1 g total) by mouth in the morning, at noon, in the evening, and at bedtime. 01/29/20   Lorelee New, PA-C    Allergies    Patient has no known allergies.  Review of Systems   Review of Systems  All other systems reviewed and are negative.   Physical Exam Updated Vital Signs BP (!) 148/88    Pulse (!) 110    Temp 100.1 F (37.8 C) (Rectal)    Resp (!) 31    Ht 6' (1.829 m)    Wt 72.6 kg    SpO2 98%    BMI 21.70 kg/m   Physical Exam Vitals and nursing note reviewed. Exam conducted with a chaperone present.    Constitutional:      General: He is not in acute distress. HENT:     Head: Normocephalic and atraumatic.  Eyes:     General: No scleral icterus.    Conjunctiva/sclera: Conjunctivae normal.  Cardiovascular:     Rate and Rhythm: Regular rhythm. Tachycardia present.     Pulses: Normal pulses.     Heart sounds: Normal heart sounds.  Pulmonary:     Comments: No increased work of breathing.  Normal respiratory effort.  No distress.  Breath sounds intact bilaterally.  No abnormal breath sounds on auscultation. Abdominal:     Comments: Generalized abdominal tenderness on exam, most notably in the LUQ and epigastrium.  No guarding.  Soft, nondistended.  No overlying skin changes.  Musculoskeletal:     Right lower leg: No edema.     Left lower leg: No edema.  Skin:    General: Skin is dry.     Capillary Refill: Capillary refill takes less than 2 seconds.  Neurological:     Mental Status: He is alert and oriented to person, place, and time.     GCS: GCS eye subscore is 4. GCS verbal subscore is 5. GCS motor subscore is 6.  Psychiatric:        Mood and Affect: Mood normal.        Behavior: Behavior normal.        Thought Content: Thought content normal.     ED Results / Procedures / Treatments   Labs (all labs ordered are listed, but only abnormal results are displayed) Labs Reviewed  BASIC METABOLIC PANEL - Abnormal; Notable for the following components:      Result Value   CO2 21 (*)    All other components within normal limits  CBC - Abnormal; Notable for the following components:   WBC 14.2 (*)    Hemoglobin 11.1 (*)    HCT 37.1 (*)    MCH 25.3 (*)    MCHC 29.9 (*)    RDW 18.3 (*)    Platelets 470 (*)    All other components within normal limits  HEPATIC FUNCTION PANEL - Abnormal; Notable for the following components:   Albumin 2.8 (*)    AST 13 (*)    Indirect Bilirubin 0.2 (*)    All other components within normal limits  URINALYSIS, ROUTINE W REFLEX MICROSCOPIC -  Abnormal; Notable for the following components:  APPearance HAZY (*)    Specific Gravity, Urine >1.046 (*)    All other components within normal limits  CULTURE, BLOOD (ROUTINE X 2)  CULTURE, BLOOD (ROUTINE X 2)  URINE CULTURE  LIPASE, BLOOD  LACTIC ACID, PLASMA  PROTIME-INR  APTT  TROPONIN I (HIGH SENSITIVITY)  TROPONIN I (HIGH SENSITIVITY)    EKG EKG Interpretation  Date/Time:  Wednesday January 29 2020 12:51:53 EDT Ventricular Rate:  101 PR Interval:    QRS Duration: 82 QT Interval:  349 QTC Calculation: 453 R Axis:   61 Text Interpretation: Sinus tachycardia Normal ECG No significant change since last tracing Confirmed by Susy Frizzle [phone]) on 01/29/2020 11:24:06 PM   Radiology DG Chest 2 View  Result Date: 01/29/2020 CLINICAL DATA:  Shortness of breath, heartburn, hyperventilating EXAM: CHEST - 2 VIEW COMPARISON:  01/09/2020 FINDINGS: Frontal and lateral views of the chest demonstrate a stable cardiac silhouette. Lungs are hyperexpanded, with chronic blunting of the costophrenic angles consistent with pleural thickening. No acute airspace disease, effusion, or pneumothorax. No acute bony abnormalities. IMPRESSION: 1. Stable emphysema, no acute airspace disease. Electronically Signed   By: Sharlet Salina M.D.   On: 01/29/2020 16:36   CT ABDOMEN PELVIS W CONTRAST  Result Date: 01/29/2020 CLINICAL DATA:  Abdominal pain and fever. EXAM: CT ABDOMEN AND PELVIS WITH CONTRAST TECHNIQUE: Multidetector CT imaging of the abdomen and pelvis was performed using the standard protocol following bolus administration of intravenous contrast. CONTRAST:  OMNIPAQUE IOHEXOL 300 MG/ML  SOLN COMPARISON:  Abdominal CT 2 weeks ago 01/14/2020. This is the patient's third abdominal CT in the past 3 weeks. FINDINGS: Lower chest: Persistent but improving distal esophageal wall thickening with mild hyperemia and small paraesophageal nodes. Mild dependent atelectasis. Mild scarring in the  anterior right lower lobe and right middle lobe. Coronary artery calcifications. Hepatobiliary: Mild hepatic steatosis. Equivocal capsular nodularity again seen. Partially distended gallbladder without pericholecystic inflammation or calcified gallstone. No biliary dilatation. Pancreas: No ductal dilatation or inflammation. Spleen: Normal in size without focal abnormality. Adrenals/Urinary Tract: Again seen adrenal thickening without dominant nodule. No hydronephrosis. No significant perinephric edema. Stable small cortical cyst in the mid left kidney. Mild urinary bladder wall thickening with multiple small bladder diverticula. Stable calcification either within a collapsed diverticulum or left posterior bladder wall. Stomach/Bowel: Persistent but improving distal esophageal wall thickening with mild hyperemia. Small amount of intraluminal fluid in the distal esophagus. Nondistended stomach which is grossly unremarkable. Again seen distal duodenal diverticulum without inflammatory change. No small bowel obstruction or inflammatory change. Normal appendix, series 2, image 51. Scattered colonic diverticulosis without diverticulitis. There is distal transverse colonic tortuosity. Descending and sigmoid colon are decompressed. Vascular/Lymphatic: Moderate aortic and branch atherosclerosis. No aortic aneurysm. Retroaortic left renal vein. Patent portal vein. Small paraesophageal nodes are unchanged from prior exam. Portal caval node measures 9 mm, previously 10 mm. No new or progressive adenopathy. Reproductive: TURP defect in the prostate gland. Other: No ascites, free air or focal abscess. Fat in the left inguinal canal. Musculoskeletal: No acute osseous abnormalities are seen. IMPRESSION: 1. Persistent but improving distal esophageal wall thickening with mild hyperemia and small paraesophageal nodes, consistent with esophagitis. 2. No other acute findings in the abdomen/pelvis. 3. Colonic diverticulosis without  diverticulitis. 4. Hepatic steatosis. Equivocal capsular nodularity again seen. Recommend correlation for cirrhosis risk factors. Aortic Atherosclerosis (ICD10-I70.0). Electronically Signed   By: Narda Rutherford M.D.   On: 01/29/2020 21:40    Procedures Procedures (including critical care time)  Medications Ordered  in ED Medications  lactated ringers infusion ( Intravenous New Bag/Given (Non-Interop) 01/29/20 2100)  pantoprazole (PROTONIX) injection 40 mg (40 mg Intravenous Given 01/29/20 2059)  sucralfate (CARAFATE) tablet 1 g (1 g Oral Given 01/29/20 2152)  iohexol (OMNIPAQUE) 300 MG/ML solution 100 mL (100 mLs Intravenous Contrast Given 01/29/20 2116)    ED Course  I have reviewed the triage vital signs and the nursing notes.  Pertinent labs & imaging results that were available during my care of the patient were reviewed by me and considered in my medical decision making (see chart for details).  Clinical Course as of Sep 09 0000  Wed Jan 29, 2020  2348 Spoke with Dr. Barron Alvine, [facility], and discussed patient at length.  He reviewed patient's chart and recommended that we change his omeprazole 40 mg twice daily to pantoprazole 40 mg twice daily.  He also advised that we add famotidine 40 mg twice daily and emphasized importance of Carafate to the patient.     [GG]    Clinical Course User Index [GG] Lorelee New, PA-C   MDM Rules/Calculators/A&P                          Obtain rectal temperature which was 100.1 F.  Patient is also tachycardic to 104 and tachypneic to 22.  Will obtain sepsis work-up.  Labs UA: Elevated specific gravity, no infection. Troponin: WNL at 4. PT/APTT: WNL. Blood cultures: In process. CBC: Leukocytosis to 14.2, new when compared to labs obtained 12 days ago.  Mild anemia with hemoglobin of 11.1, improved when compared to recent labs. BMP: Unremarkable. Lactic acid: WNL.  Will not trend. Lipase: WNL. Hepatic function panel:  Unremarkable.  Obtained plain films of chest which were personally reviewed and demonstrate stable emphysema, but no acute cardiopulmonary findings.  CT abdomen pelvis with contrast was also obtained which demonstrates persistent but improving distal esophageal wall thickening with mild hyperemia and small periesophageal nodes consistent with esophagitis.  Spoke with Dr. Barron Alvine, [facility], and discussed patient at length.  He reviewed patient's chart and recommended that we add famotidine 40 mg twice daily and emphasized importance of Carafate to the patient.  Patient will need to continue with the twice daily pantoprazole 40 mg.     No clear explanation for the leukocytosis.  We did not check UDS and patient does have history of cocaine use.  We will make the medication adjustments, as advised by GI.  They will call him tomorrow to schedule appointment for close follow-up.  Given his reassuring work-up, he is safe for discharge at this time.    On reassessment, patient is understanding and agreeable to plan.  Understands strict ED return precautions.  He admits that he felt mildly improved with the sucralfate, although short-lived. All of the evaluation and work-up results were discussed with the patient and any family at bedside.  Patient and/or family were informed that while patient is appropriate for discharge at this time, some medical emergencies may only develop or become detectable after a period of time.  I specifically instructed patient and/or family to return to return to the ED or seek immediate medical attention for any new or worsening symptoms.  They were provided opportunity to ask any additional questions and have none at this time.  Prior to discharge patient is feeling well, agreeable with plan for discharge home.  They have expressed understanding of verbal discharge instructions as well as return precautions and  are agreeable to the plan.   Final Clinical Impression(s) / ED  Diagnoses Final diagnoses:  Gastroesophageal reflux disease with esophagitis without hemorrhage    Rx / DC Orders ED Discharge Orders         Ordered    famotidine (PEPCID) 40 MG tablet  2 times daily        01/29/20 2355    sucralfate (CARAFATE) 1 g tablet  4 times daily        01/29/20 2355           Lorelee NewGreen, Yamilex Borgwardt L, PA-C 01/30/20 0000    Pollyann SavoySheldon, Charles B, MD 01/30/20 [phone]

## 2020-01-30 ENCOUNTER — Telehealth: Payer: Self-pay

## 2020-01-30 DIAGNOSIS — K2100 Gastro-esophageal reflux disease with esophagitis, without bleeding: Secondary | ICD-10-CM | POA: Diagnosis not present

## 2020-01-30 NOTE — Discharge Instructions (Signed)
It is vitally important that you take the 40 mg pantoprazole and 40 mg famotidine twice daily, as directed.  I would also like you to start taking the Carafate (sucralfate) 4 times daily.  This will be incredibly important for your heartburn symptoms.  Please read the attachment on recommended food choices.  I also encourage you to elevate the head of your bed to help mitigate the pain from heartburn.  Please return to the ER or seek immediate medical attention should you experience any new or worsening symptoms.

## 2020-01-30 NOTE — Telephone Encounter (Signed)
appt has been rescheduled to 9/14 with Doug Sou PA.  He has been advised and will follow instruction for Carafate.

## 2020-01-30 NOTE — Telephone Encounter (Signed)
-----   Message from Lemar Lofty., MD sent at 01/30/2020  5:34 AM EDT ----- VC,Thank you for the update.Rodger Giangregorio if there is any earlier APP follow-up he can be added to that.He was not taking his Carafate as had been the discussion.  So hopefully that he has now been told to take it able continue to help in improving his severe esophagitis.Repeat endoscopy is not going to help [place] and other than potentially adding some lidocaine swallows not sure what else we can do.Thanks.GM ----- Message ----- From: Shellia Cleverly, DO Sent: 01/29/2020  11:49 PM EDT To: Loretha Stapler, RN, Lemar Lofty., MD  74 yo male with recent admission n/f severe esophagitis. Treated with high dose PPI. Returns to ER with ongoing severe reflux sxs. ER called to arrange expedited outpatient f/u.

## 2020-01-31 LAB — URINE CULTURE

## 2020-02-03 DIAGNOSIS — R404 Transient alteration of awareness: Secondary | ICD-10-CM | POA: Diagnosis not present

## 2020-02-03 DIAGNOSIS — Z743 Need for continuous supervision: Secondary | ICD-10-CM | POA: Diagnosis not present

## 2020-02-04 ENCOUNTER — Ambulatory Visit: Payer: Medicare Other | Admitting: Gastroenterology

## 2020-02-04 LAB — CULTURE, BLOOD (ROUTINE X 2)
Culture: NO GROWTH
Culture: NO GROWTH
Special Requests: ADEQUATE

## 2020-02-07 ENCOUNTER — Encounter: Payer: Self-pay | Admitting: Cardiology

## 2020-02-21 DIAGNOSIS — 419620001 Death: Secondary | SNOMED CT | POA: Diagnosis not present

## 2020-02-21 DEATH — deceased

## 2020-03-12 ENCOUNTER — Ambulatory Visit: Payer: Medicare Other | Admitting: Gastroenterology

## 2021-12-08 IMAGING — US US ABDOMEN COMPLETE
1 series · 14 of 25 positions shown · non-contrast
Comparison: None.

CLINICAL DATA: Abdominal pain

EXAM:
ABDOMEN ULTRASOUND COMPLETE

[Series 1: us abdomen complete · 14 of 154 slices shown]
[im 1/154]
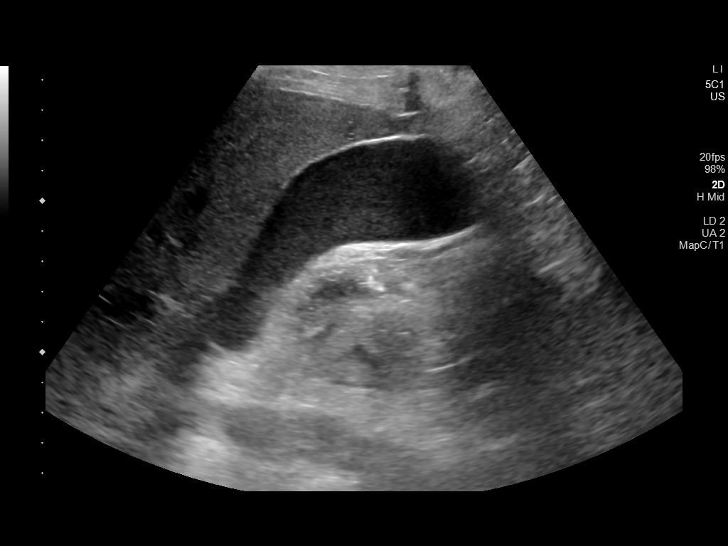
[im 13/154]
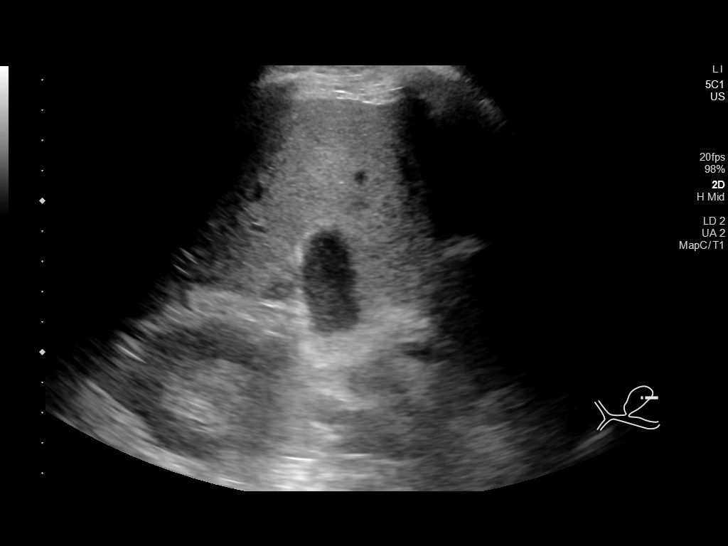
[im 26/154]
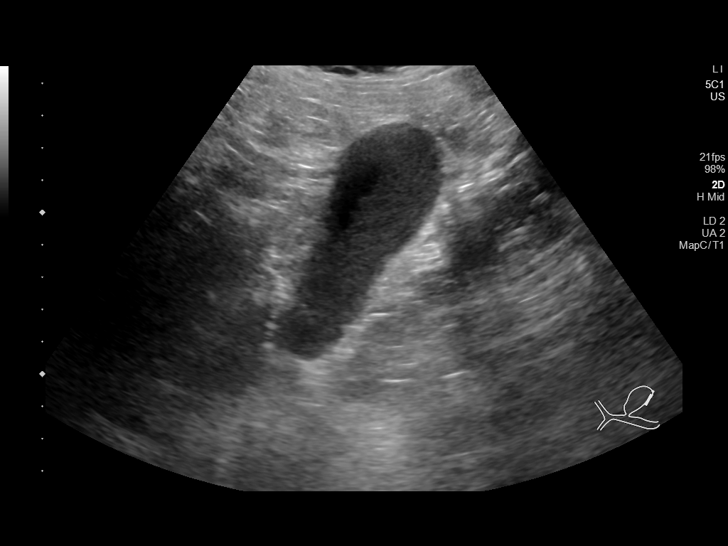
[im 39/154]
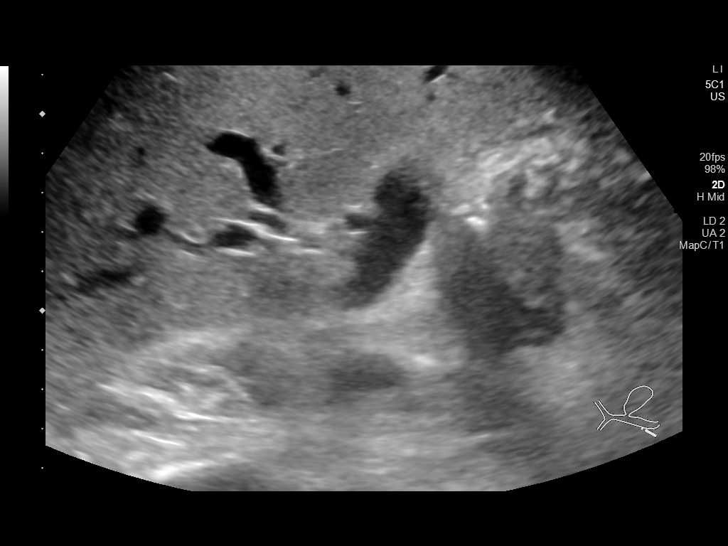
[im 52/154]
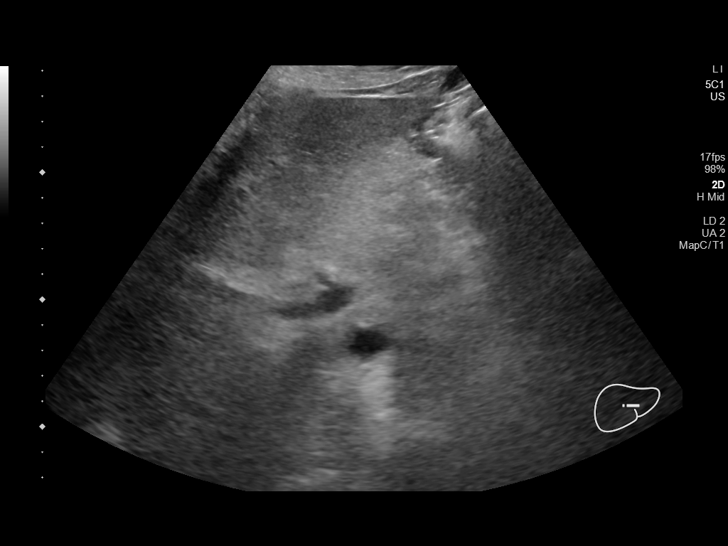
[im 58/154]
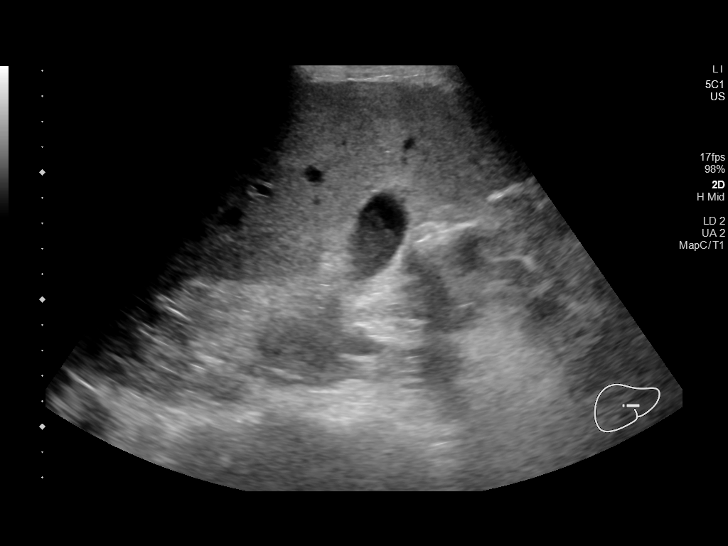
[im 71/154]
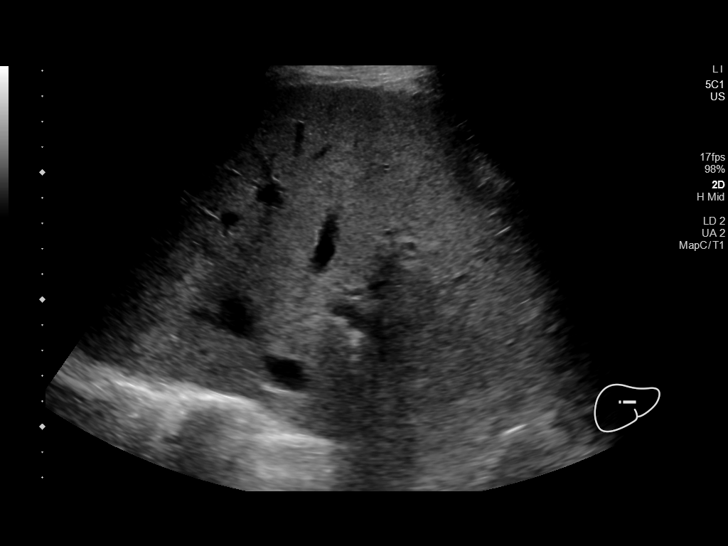
[im 83/154]
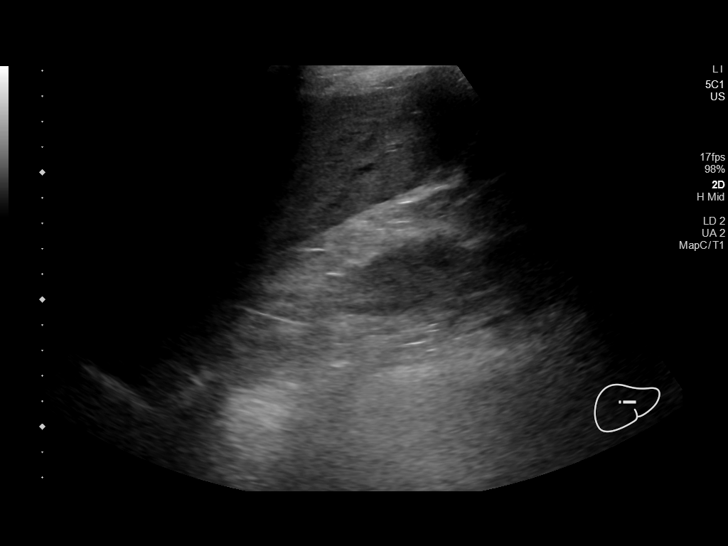
[im 96/154]
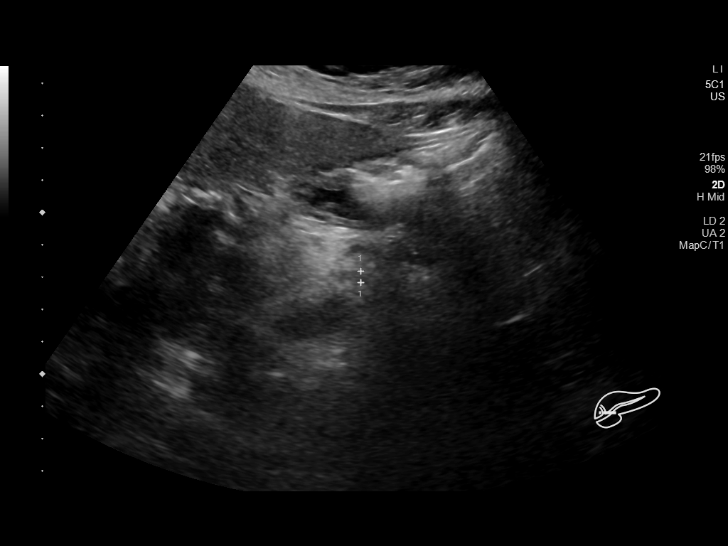
[im 103/154]
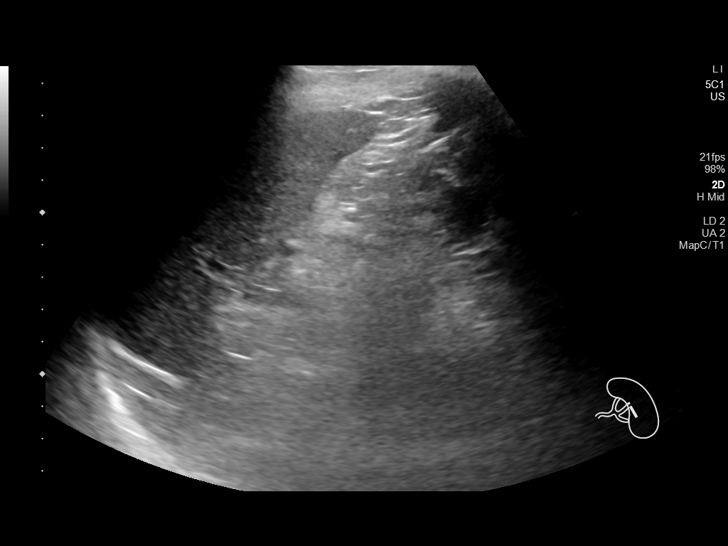
[im 115/154]
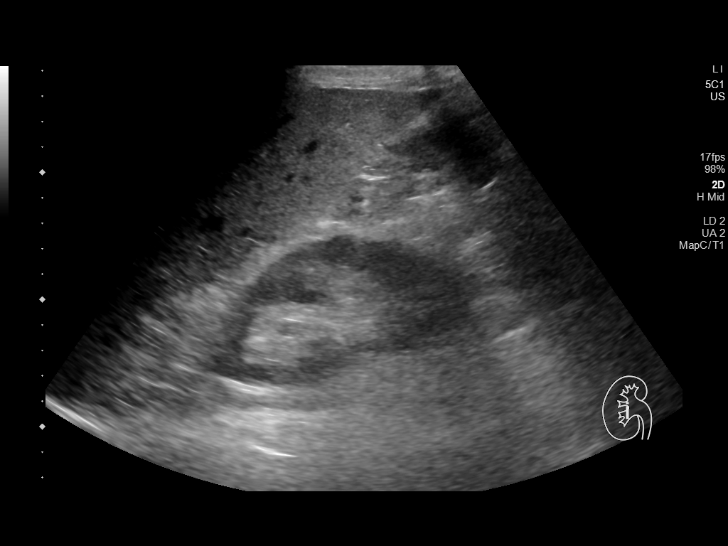
[im 128/154]
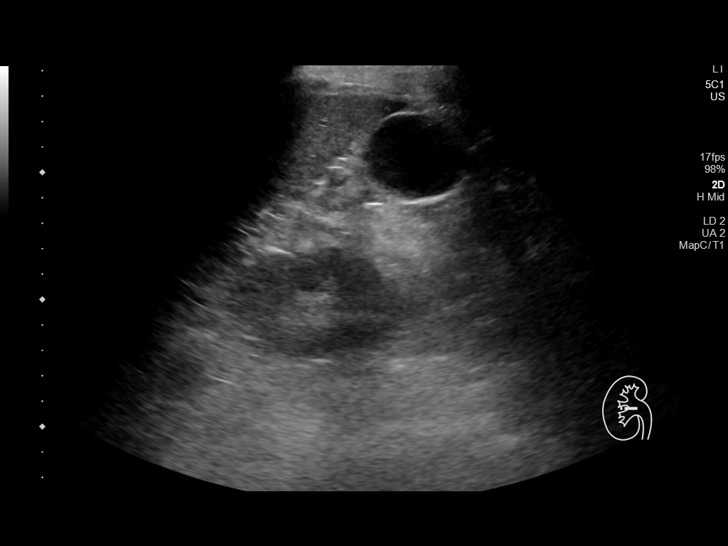
[im 141/154]
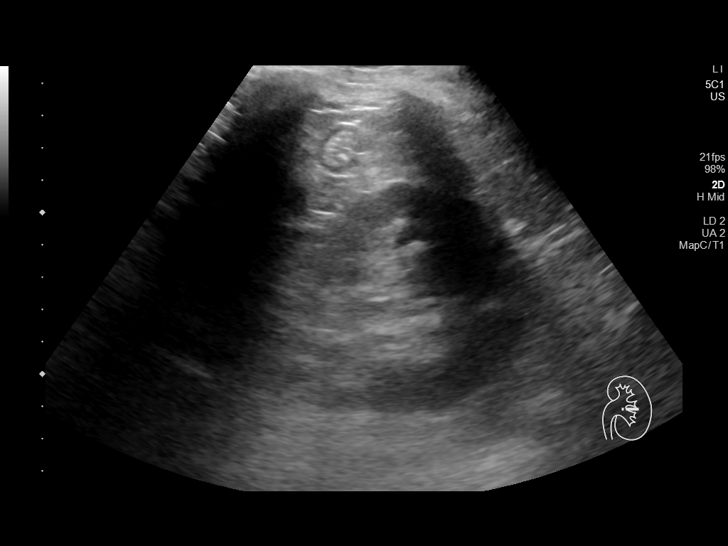
[im 154/154]
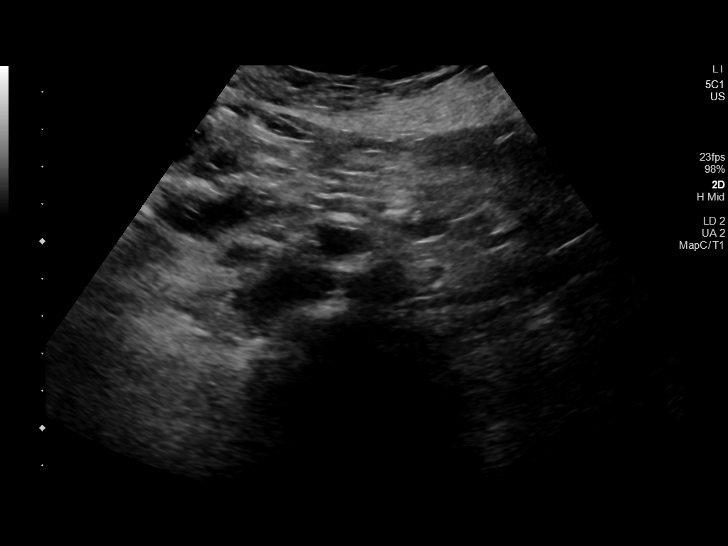

[14 of 25 positions shown; findings below may reference images not displayed]

FINDINGS: Gallbladder: The gallbladder is distended layering sludge/small
stones. No sonographic Murphy sign seen. There is a mildly prominent
gallbladder wall measuring up to 3.5 mm

Common bile duct: Diameter: 5.3 mm

Liver: Increased echotexture seen throughout. No focal abnormality
or biliary ductal dilatation. The liver is enlarged measuring 19 cm
in craniocaudad dimension. Portal vein is patent on color Doppler
imaging with normal direction of blood flow towards the liver.

IVC: No abnormality visualized.

Pancreas: Visualized portion unremarkable.

Spleen: Size and appearance within normal limits.

Right Kidney: Length: 11.3 cm. Echogenicity within normal limits. No
mass or hydronephrosis visualized.

Left Kidney: Length: 12.0cm. Echogenicity within normal limits. No
mass or hydronephrosis visualized.

Abdominal aorta: No aneurysm visualized.

Other findings: None.
IMPRESSION: Layering gallbladder sludge/small stones with a mildly prominent
gallbladder wall. This is nonspecific, could be due to acute
cholecystitis.

Hepatic steatosis and mild hepatomegaly

## 2021-12-11 IMAGING — DX DG ABDOMEN 2V
4 series · 4 of 4 positions shown · non-contrast
Comparison: November 11, 2016

CLINICAL DATA: Abdominal pain with gastrointestinal bleeding

EXAM:
ABDOMEN - 2 VIEW

[abdomen supine (1 of 2)]
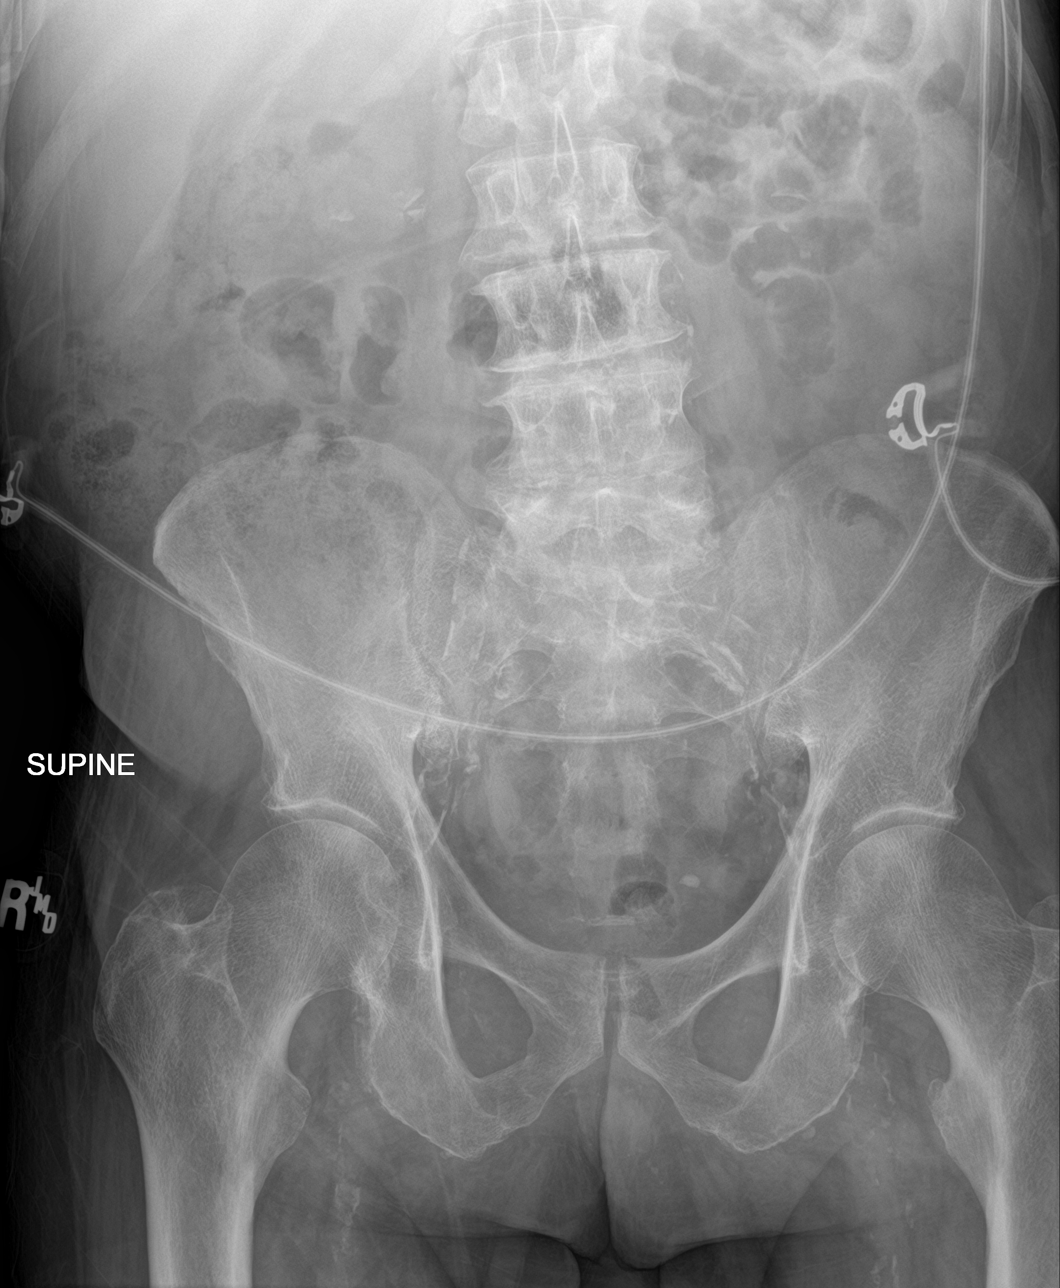

[abdomen supine (2 of 2)]
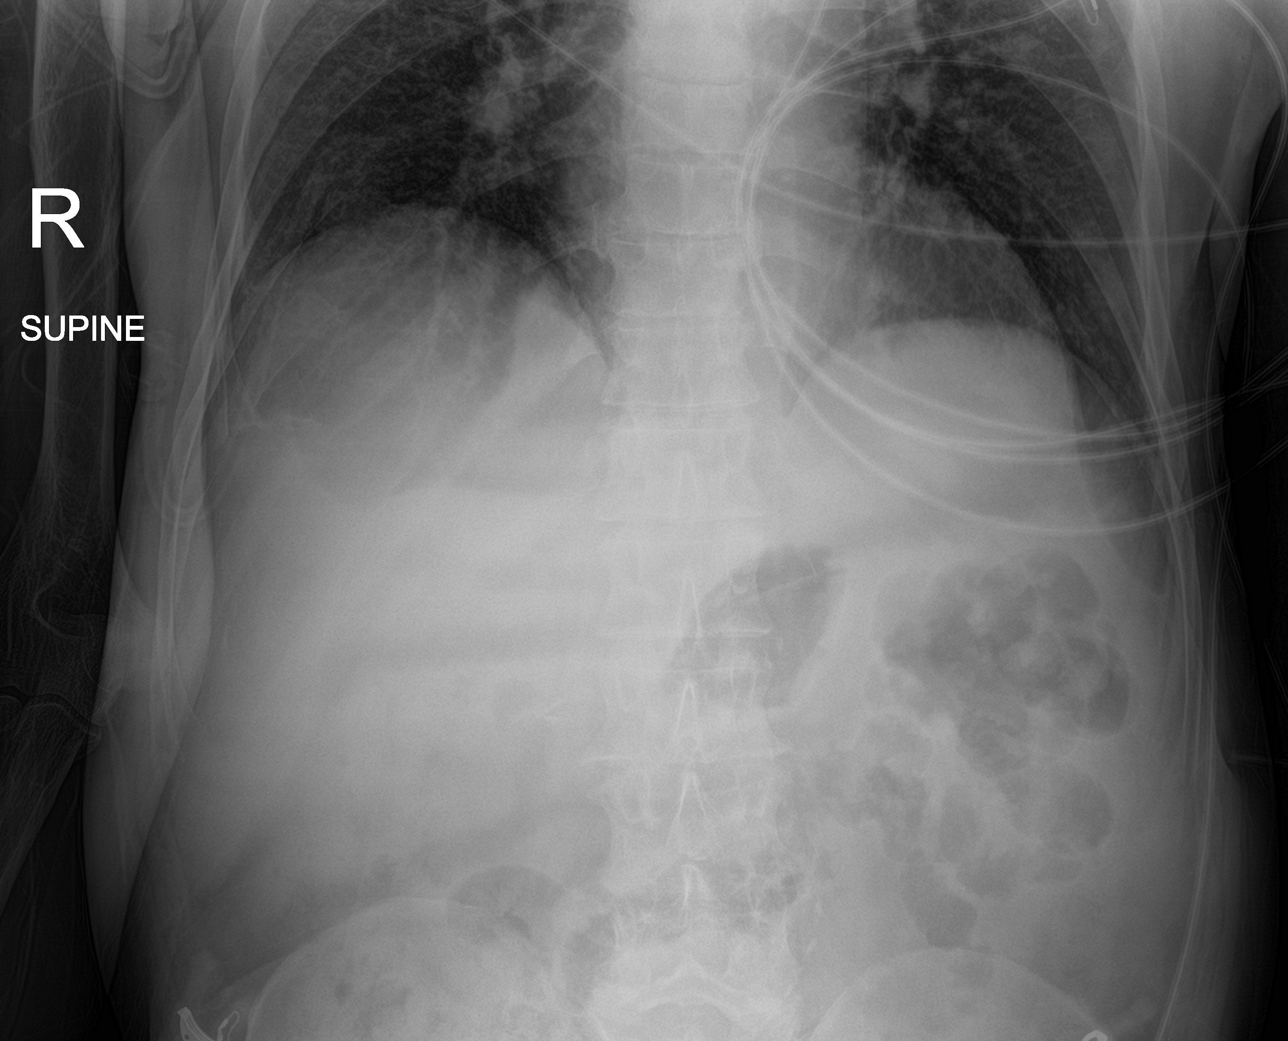

[abdomen decu (1 of 2)]
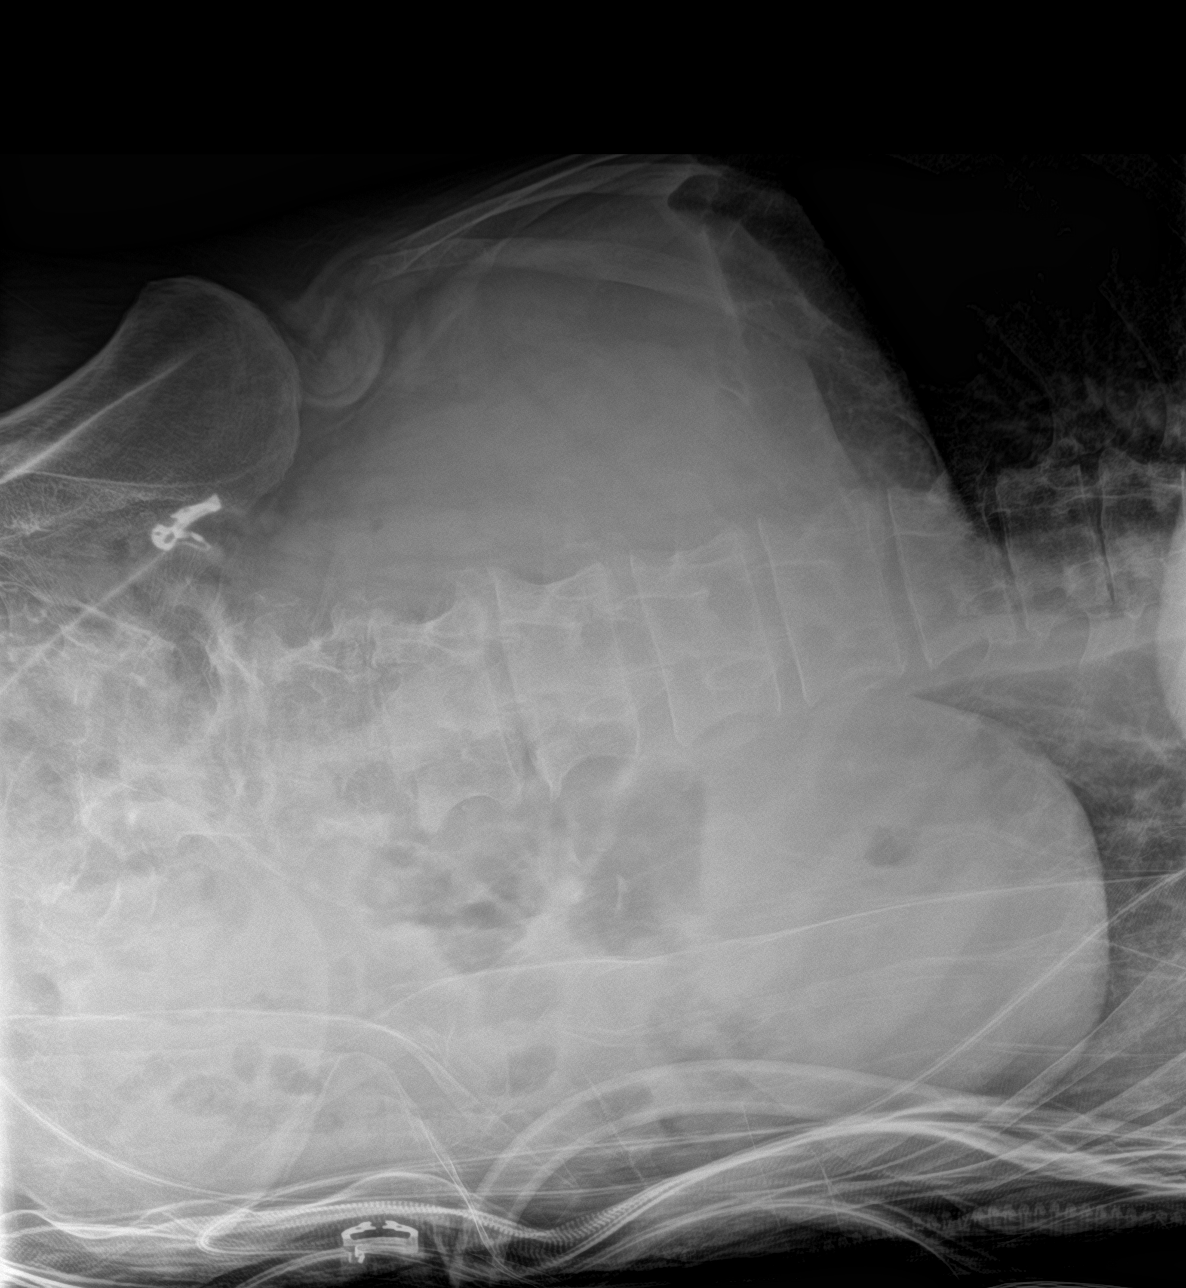

[abdomen decu (2 of 2)]
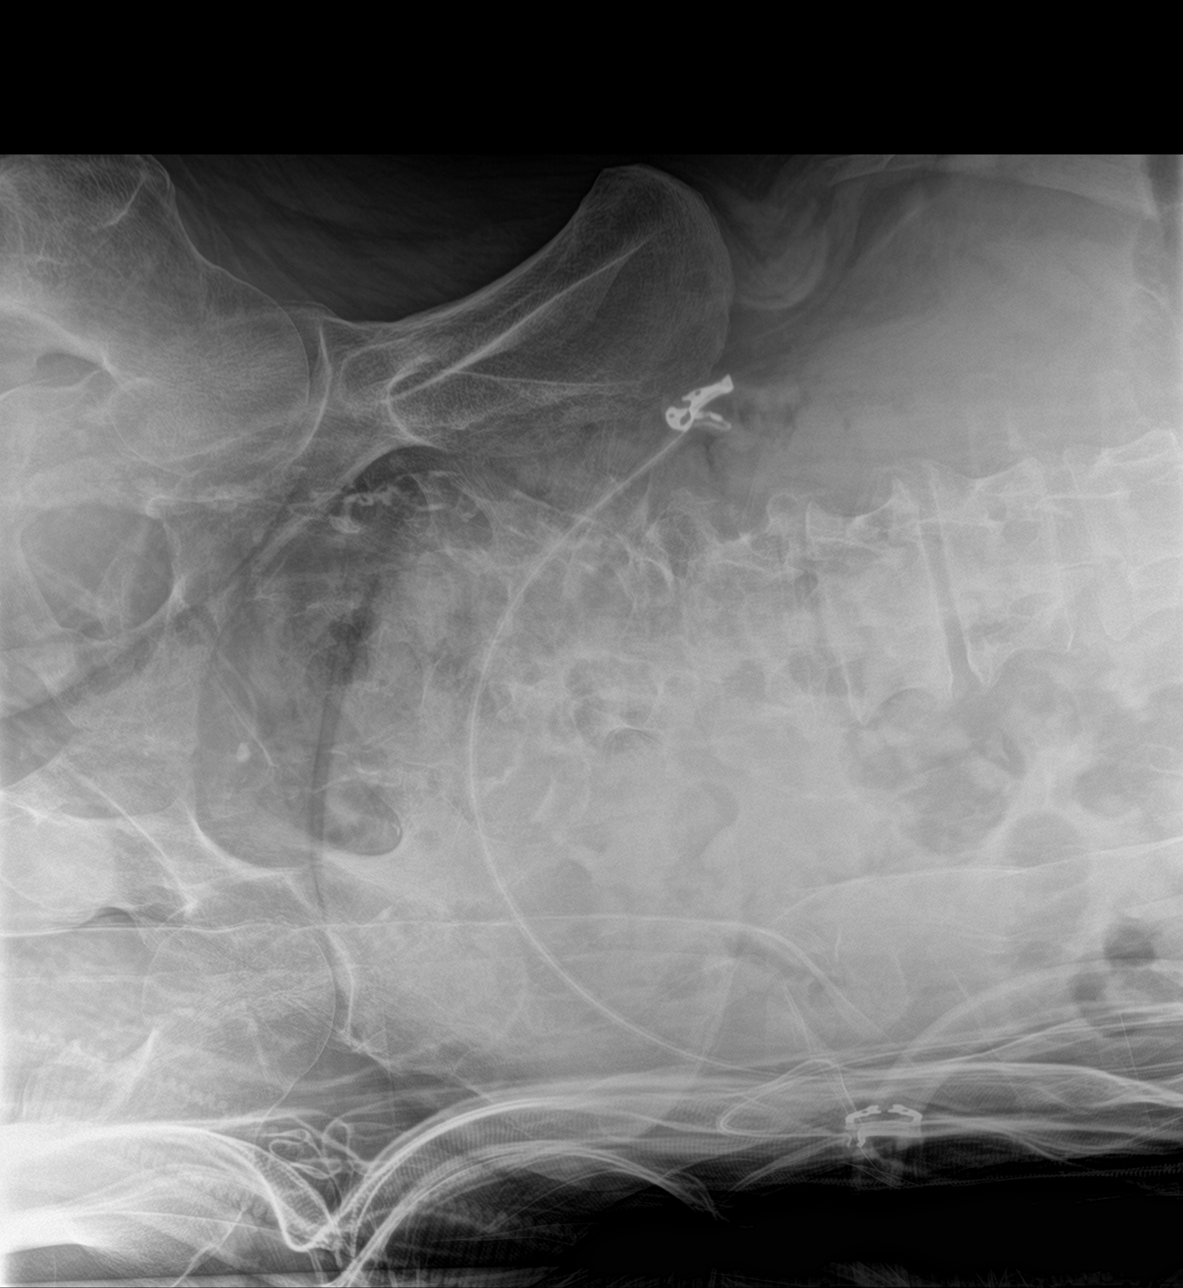

[4 of 4 positions shown; findings below may reference images not displayed]

FINDINGS: Supine and left lateral decubitus abdomen images obtained. There is
moderate stool in the colon. There is no bowel dilatation or
air-fluid level to suggest bowel obstruction. No demonstrable free
air. There is aortic and iliac artery atherosclerosis bilaterally.
There are phleboliths in the pelvis as well. Visualized lung bases
clear.
IMPRESSION: Moderate stool in colon. No bowel obstruction or free air. Multiple
foci of arterial vascular calcification evident.

Aortic Atherosclerosis ([id number].B).
